# Patient Record
Sex: Female | Born: 1940 | Race: White | Hispanic: No | Marital: Single | Smoking: Never smoker
Health system: Southern US, Community
[De-identification: ages and names within clinical notes are randomized; demographics above are authoritative.]

## PROBLEM LIST (undated history)

## (undated) ENCOUNTER — Emergency Department (HOSPITAL_BASED_OUTPATIENT_CLINIC_OR_DEPARTMENT_OTHER): Admission: EM | Payer: Medicare Other | Source: Home / Self Care

## (undated) DIAGNOSIS — I10 Essential (primary) hypertension: Secondary | ICD-10-CM

## (undated) DIAGNOSIS — T8859XA Other complications of anesthesia, initial encounter: Secondary | ICD-10-CM

## (undated) DIAGNOSIS — Z85828 Personal history of other malignant neoplasm of skin: Secondary | ICD-10-CM

## (undated) DIAGNOSIS — E781 Pure hyperglyceridemia: Secondary | ICD-10-CM

## (undated) DIAGNOSIS — R001 Bradycardia, unspecified: Secondary | ICD-10-CM

## (undated) DIAGNOSIS — M8580 Other specified disorders of bone density and structure, unspecified site: Secondary | ICD-10-CM

## (undated) DIAGNOSIS — T4145XA Adverse effect of unspecified anesthetic, initial encounter: Secondary | ICD-10-CM

## (undated) DIAGNOSIS — E785 Hyperlipidemia, unspecified: Secondary | ICD-10-CM

## (undated) DIAGNOSIS — F419 Anxiety disorder, unspecified: Secondary | ICD-10-CM

## (undated) DIAGNOSIS — I671 Cerebral aneurysm, nonruptured: Secondary | ICD-10-CM

## (undated) DIAGNOSIS — G5600 Carpal tunnel syndrome, unspecified upper limb: Secondary | ICD-10-CM

## (undated) DIAGNOSIS — M1990 Unspecified osteoarthritis, unspecified site: Secondary | ICD-10-CM

## (undated) DIAGNOSIS — R21 Rash and other nonspecific skin eruption: Secondary | ICD-10-CM

## (undated) DIAGNOSIS — E039 Hypothyroidism, unspecified: Secondary | ICD-10-CM

## (undated) DIAGNOSIS — E079 Disorder of thyroid, unspecified: Secondary | ICD-10-CM

## (undated) DIAGNOSIS — M8700 Idiopathic aseptic necrosis of unspecified bone: Secondary | ICD-10-CM

## (undated) DIAGNOSIS — M169 Osteoarthritis of hip, unspecified: Secondary | ICD-10-CM

## (undated) DIAGNOSIS — H269 Unspecified cataract: Secondary | ICD-10-CM

## (undated) DIAGNOSIS — R52 Pain, unspecified: Secondary | ICD-10-CM

## (undated) DIAGNOSIS — B009 Herpesviral infection, unspecified: Secondary | ICD-10-CM

## (undated) DIAGNOSIS — J189 Pneumonia, unspecified organism: Secondary | ICD-10-CM

## (undated) DIAGNOSIS — F32A Depression, unspecified: Secondary | ICD-10-CM

## (undated) DIAGNOSIS — F329 Major depressive disorder, single episode, unspecified: Secondary | ICD-10-CM

## (undated) DIAGNOSIS — C801 Malignant (primary) neoplasm, unspecified: Secondary | ICD-10-CM

## (undated) DIAGNOSIS — M810 Age-related osteoporosis without current pathological fracture: Secondary | ICD-10-CM

## (undated) DIAGNOSIS — J302 Other seasonal allergic rhinitis: Secondary | ICD-10-CM

## (undated) HISTORY — DX: Osteoarthritis of hip, unspecified: M16.9

## (undated) HISTORY — PX: TUBAL LIGATION: SHX77

## (undated) HISTORY — DX: Other specified disorders of bone density and structure, unspecified site: M85.80

## (undated) HISTORY — DX: Other seasonal allergic rhinitis: J30.2

## (undated) HISTORY — DX: Idiopathic aseptic necrosis of unspecified bone: M87.00

## (undated) HISTORY — DX: Cerebral aneurysm, nonruptured: I67.1

## (undated) HISTORY — PX: TOTAL HIP ARTHROPLASTY: SHX124

## (undated) HISTORY — PX: CHOLECYSTECTOMY: SHX55

## (undated) HISTORY — PX: OTHER SURGICAL HISTORY: SHX169

## (undated) HISTORY — DX: Herpesviral infection, unspecified: B00.9

## (undated) HISTORY — PX: TONSILLECTOMY AND ADENOIDECTOMY: SUR1326

## (undated) HISTORY — DX: Disorder of thyroid, unspecified: E07.9

## (undated) HISTORY — DX: Bradycardia, unspecified: R00.1

## (undated) HISTORY — DX: Pure hyperglyceridemia: E78.1

## (undated) SURGERY — Surgical Case
Anesthesia: *Unknown

---

## 2004-12-03 DIAGNOSIS — G7000 Myasthenia gravis without (acute) exacerbation: Secondary | ICD-10-CM | POA: Insufficient documentation

## 2005-10-05 HISTORY — PX: CEREBRAL ANEURYSM REPAIR: SHX164

## 2006-01-02 HISTORY — PX: BELPHAROPTOSIS REPAIR: SHX369

## 2006-11-07 ENCOUNTER — Ambulatory Visit: Payer: Self-pay | Admitting: Internal Medicine

## 2007-04-11 ENCOUNTER — Encounter: Admission: RE | Admit: 2007-04-11 | Discharge: 2007-04-11 | Payer: Self-pay | Admitting: Obstetrics and Gynecology

## 2007-04-27 ENCOUNTER — Emergency Department (HOSPITAL_COMMUNITY): Admission: EM | Admit: 2007-04-27 | Discharge: 2007-04-27 | Payer: Self-pay | Admitting: Emergency Medicine

## 2007-05-22 ENCOUNTER — Ambulatory Visit (HOSPITAL_COMMUNITY): Admission: RE | Admit: 2007-05-22 | Discharge: 2007-05-22 | Payer: Self-pay | Admitting: Internal Medicine

## 2007-05-24 ENCOUNTER — Ambulatory Visit: Payer: Self-pay | Admitting: Critical Care Medicine

## 2007-05-29 ENCOUNTER — Ambulatory Visit: Admission: RE | Admit: 2007-05-29 | Discharge: 2007-05-29 | Payer: Self-pay | Admitting: Critical Care Medicine

## 2007-05-30 ENCOUNTER — Ambulatory Visit: Payer: Self-pay | Admitting: Internal Medicine

## 2007-05-30 ENCOUNTER — Ambulatory Visit (HOSPITAL_COMMUNITY): Admission: RE | Admit: 2007-05-30 | Discharge: 2007-05-30 | Payer: Self-pay | Admitting: Critical Care Medicine

## 2007-07-09 ENCOUNTER — Ambulatory Visit: Payer: Self-pay | Admitting: Critical Care Medicine

## 2008-06-03 ENCOUNTER — Encounter: Admission: RE | Admit: 2008-06-03 | Discharge: 2008-06-03 | Payer: Self-pay | Admitting: Obstetrics and Gynecology

## 2008-07-20 ENCOUNTER — Encounter: Admission: RE | Admit: 2008-07-20 | Discharge: 2008-07-20 | Payer: Self-pay | Admitting: Internal Medicine

## 2009-07-26 ENCOUNTER — Encounter: Admission: RE | Admit: 2009-07-26 | Discharge: 2009-07-26 | Payer: Self-pay | Admitting: Obstetrics and Gynecology

## 2009-10-19 ENCOUNTER — Inpatient Hospital Stay (HOSPITAL_COMMUNITY): Admission: RE | Admit: 2009-10-19 | Discharge: 2009-10-22 | Payer: Self-pay | Admitting: Orthopaedic Surgery

## 2009-10-19 HISTORY — PX: JOINT REPLACEMENT: SHX530

## 2009-12-01 ENCOUNTER — Encounter: Admission: RE | Admit: 2009-12-01 | Discharge: 2009-12-30 | Payer: Self-pay | Admitting: Orthopaedic Surgery

## 2010-08-11 ENCOUNTER — Encounter
Admission: RE | Admit: 2010-08-11 | Discharge: 2010-08-11 | Payer: Self-pay | Source: Home / Self Care | Attending: Internal Medicine | Admitting: Internal Medicine

## 2010-09-14 ENCOUNTER — Encounter
Admission: RE | Admit: 2010-09-14 | Discharge: 2010-09-14 | Payer: Self-pay | Source: Home / Self Care | Attending: Internal Medicine | Admitting: Internal Medicine

## 2010-10-05 ENCOUNTER — Encounter: Payer: Self-pay | Admitting: Rheumatology

## 2010-11-24 LAB — URINALYSIS, ROUTINE W REFLEX MICROSCOPIC
Bilirubin Urine: NEGATIVE
Hgb urine dipstick: NEGATIVE
Nitrite: NEGATIVE
Urobilinogen, UA: 0.2 mg/dL (ref 0.0–1.0)

## 2010-11-24 LAB — BASIC METABOLIC PANEL
BUN: 4 mg/dL — ABNORMAL LOW (ref 6–23)
BUN: 7 mg/dL (ref 6–23)
CO2: 30 mEq/L (ref 19–32)
Calcium: 10.1 mg/dL (ref 8.4–10.5)
Calcium: 8 mg/dL — ABNORMAL LOW (ref 8.4–10.5)
Chloride: 100 mEq/L (ref 96–112)
Chloride: 101 mEq/L (ref 96–112)
Chloride: 104 mEq/L (ref 96–112)
Creatinine, Ser: 0.79 mg/dL (ref 0.4–1.2)
Creatinine, Ser: 0.95 mg/dL (ref 0.4–1.2)
GFR calc Af Amer: >60 mL/min (ref >60)
GFR calc non Af Amer: >60 mL/min (ref >60)
Glucose, Bld: 103 mg/dL — ABNORMAL HIGH (ref 70–99)
Glucose, Bld: 140 mg/dL — ABNORMAL HIGH (ref 70–99)
Potassium: 3.1 mEq/L — ABNORMAL LOW (ref 3.5–5.1)
Potassium: 3.7 mEq/L (ref 3.5–5.1)
Sodium: 134 mEq/L — ABNORMAL LOW (ref 135–145)

## 2010-11-24 LAB — CBC
HCT: 29.3 % — ABNORMAL LOW (ref 36.0–46.0)
HCT: 29.4 % — ABNORMAL LOW (ref 36.0–46.0)
Hemoglobin: 10.2 g/dL — ABNORMAL LOW (ref 12.0–15.0)
Hemoglobin: 10.3 g/dL — ABNORMAL LOW (ref 12.0–15.0)
MCHC: 34.1 g/dL (ref 30.0–36.0)
MCHC: 34.7 g/dL (ref 30.0–36.0)
MCV: 88.5 fL (ref 78.0–100.0)
MCV: 89 fL (ref 78.0–100.0)
MCV: 89 fL (ref 78.0–100.0)
MCV: 89.4 fL (ref 78.0–100.0)
Platelets: 288 10*3/uL (ref 150–400)
Platelets: 311 10*3/uL (ref 150–400)
RBC: 3.3 MIL/uL — ABNORMAL LOW (ref 3.87–5.11)
RBC: 3.33 MIL/uL — ABNORMAL LOW (ref 3.87–5.11)
WBC: 4.5 10*3/uL (ref 4.0–10.5)
WBC: 6.4 10*3/uL (ref 4.0–10.5)

## 2010-11-24 LAB — PROTIME-INR
INR: 1.02 (ref 0.00–1.49)
INR: 1.29 (ref 0.00–1.49)
INR: 1.43 (ref 0.00–1.49)
Prothrombin Time: 13.3 seconds (ref 11.6–15.2)

## 2010-11-24 LAB — TYPE AND SCREEN: Antibody Screen: NEGATIVE

## 2010-11-24 LAB — ABO/RH: ABO/RH(D): A POS

## 2010-11-24 LAB — DIFFERENTIAL
Basophils Absolute: 0 10*3/uL (ref 0.0–0.1)
Basophils Relative: 0 % (ref 0–1)
Lymphocytes Relative: 27 % (ref 12–46)
Monocytes Relative: 8 % (ref 3–12)
Neutrophils Relative %: 63 % (ref 43–77)

## 2010-11-24 LAB — URINE MICROSCOPIC-ADD ON

## 2011-01-17 NOTE — Assessment & Plan Note (Signed)
[facility]                             PULMONARY OFFICE NOTE   Sabrina [name], Sabrina [name]                        MRN:          [id number]  DATE:07/09/2007                            DOB:          February 13, 1941    Ms. Draughon has returns in followup.  This is a 70 year old white female,  history of chronic dyspnea, unclear etiology.  What we discerned with  her was that her cardiopulmonary stress test was normal, with the  exception of mild diastolic dysfunction noted.  She had normal peak O2  consumption.  She had normal ventilation with exercise.  Her O2 pulse  initially increased with exercise but then remained unchanged in the  last 5 minutes of exercise.  This would be consistent with diastolic  dysfunction.  The ventilation, carbon dioxide production slope was  elevated, suggesting dead space, which could be seen in diastolic  dysfunction.  Her pulmonary function tests showed mild restrictive  defect and no obstructive defect.  It is my impression that her  obstructive airways disease is stable.  She is on the Advair 250/51  spray b.i.d. and Singulair 10 mg daily.  This program seems to be  adequate, and her level of dyspnea at this time appears to be largely on  the basis of either diastolic dysfunction or deconditioning and obesity  with restricted lung disease in the spaces.  Her myasthenia gravis does  not appear to be playing a role in her dyspnea.  She does not appear to  have significant ventilatory pump dysfunction, and she does not appear  to have significant interstitial or airway disease at this time.  No  further pulmonary workup or treatment is indicated.  No other change in  the patient's medication profile is recommended.  Will see the patient  back as needed.     Charlcie Cradle Delford Field, MD, [facility]  Electronically Signed    PEW/MedQ  DD: 07/09/2007  DT: 07/10/2007  Job #: [id number]   cc:   Kari Baars, M.D.

## 2011-01-17 NOTE — Assessment & Plan Note (Signed)
[facility]                             PULMONARY OFFICE NOTE   Sabrina [name], Sabrina [name]                        MRN:          [id number]  DATE:05/24/2007                            DOB:          02-05-1941    CHIEF COMPLAINT:  Evaluate dyspnea.   HISTORY OF PRESENT ILLNESS:  This is a 70 year old white female who has  had myasthenic gravis diagnosed since early 2007.  She initially had  ocular involvement, then had progressive bulbar and motor weakness  involvement.  She has noticed increased shortness of breath for the last  6 weeks.  She initially thought this was a myasthenia flare.  She had  some pulse prednisone given without much improvement, then she underwent  plasmapheresis x3.  Again, no real improvement in the breathlessness.  Prednisone is now down to 20 mg a day.  She has been on prednisone for a  year prior to this, and was stable until relapse 6 weeks ago.  She did  have more ocular and leg weakness, but this was improved with the pulse  prednisone and plasmapheresis, however, the dyspnea has not been  improved.  She is short of breath at rest and with exertion.  She is  worse going up a flight of stairs and walking greater than 1 block on  level ground.  She has no cough.  She was diagnosed with asthma 6 years  ago.  Denies any wheezing.  Is not using a rescue inhaler.  Has has had  no recent PFTs.  Recently had a ventilation perfusion lung scan, which  showed no pulmonary emboli.  Chest x-ray has shown no active disease.  She denies any nocturnal symptoms.  She does note a rapid pulse.  She  has no hemoptysis.  There is no loss of appetite, dysphagia, sore  throat, tooth or dental problems.  There are no headaches, sneezing,  itching, earache.  She has had some anxiety, but the Xanax does not help  the dyspnea.  She is a lifelong never smoker.  She is referred for  further evaluation.   PAST MEDICAL HISTORY:  Medical history of:  1.  Hypertension.  2. History of asthma.  3. Myasthenic gravis.  4. 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  5. Cholecystectomy in 1985.  6. Tubal ligation in 1975.   MEDICATIONS:  1. SODIUM PENTHOTHAL CAUSES SHOCK.  2. IMURAN CAUSES A SKIN RASH.   CURRENT MEDICATIONS:  1. Advair 250/50 one spray b.i.d.  2. Ambien 5 mg daily.  3. Fluticasone 2 sprays each nostril daily.  4. Lisinopril/hydrochlorothiazide 20/12.5 daily.  5. Prednisone 20 mg daily.  6. Singulair 10 mg daily.  7. Synthroid 75 mcg daily.  8. Trazodone 75 mg nightly.  9. Caltrate D 1200 mg daily.  10.Centrum daily.  11.Vitamin C daily.  12.Fish oil daily.  13.Zinc daily.  14.Albuterol p.r.n.   SOCIAL HISTORY:  Nonsmoker as noted.  Retired Film/video editor.  Lives at  home with herself.  She has children.  She is divorced.   FAMILY HISTORY:  Heart disease in mother and father.   PHYSICAL EXAMINATION:  This is an obese white female in no acute  distress.  Temperature 97.9.  Blood pressure 120/82.  Pulse 87.  Saturation 97%  room air.  CHEST:  Showed to be completely clear to auscultation and percussion.  There is no evidence of wheeze, rale, or rhonchi.  CARDIAC EXAM:  Showed a regular rate and rhythm without S3.  Normal S1  and S2.  ABDOMEN:  Soft and nontender.  EXTREMITIES:  Showed no edema, clubbing, or venous disease.  SKIN:  Was clear.  NEUROLOGIC EXAM:  Intact.  HEENT EXAM:  Showed no jugular venous distention.  No lymphadenopathy.  Oropharynx clear.  Neck supple.   IMPRESSION:  Is that of chronic dyspnea on the basis of myasthenia  gravis.  Doubt there is primary lung disease here.   PLAN:  Will be to obtain a full set of pulmonary function studies to  include diffusion capacity and lung volumes.  We will also obtain a  cardiopulmonary stress test.  Once the results of this are available,  further recommendations would follow.     Charlcie Cradle Delford Field, MD, [facility]  Electronically Signed     PEW/MedQ  DD: 05/24/2007  DT: 05/25/2007  Job #: [id number]   cc:   Kari Baars, M.D.  Orlene Plum, M.D.

## 2011-01-20 NOTE — Assessment & Plan Note (Signed)
[facility]                         GASTROENTEROLOGY OFFICE NOTE   JAN, WALTERS                        MRN:          [id number]  DATE:11/07/2006                            DOB:          Feb 01, 1941    REASON FOR CONSULTATION:  Screening colonoscopy.   HISTORY:  This is a 70 year old white female with a history of  hypertension, asthma, hypothyroidism and myasthenia gravis. She is  referred through the courtesy of Dr. Clelia Croft regarding screening  colonoscopy. The patient requested an office appointment to discuss this  in detail as well as address some of her concerns regarding anesthesia.  First, the patient denies having had prior screening colonoscopy.   REVIEW OF SYSTEMS:  Is currently unremarkable. No nausea, vomiting,  abdominal pain, diarrhea, constipation, melena, or hematochezia. Her  weight has been stable. She reports to me that she has had some problems  when she has had anesthesia in the past with difficulty with her blood  pressure. There have been other times when she has received sedatives or  anesthesia when she has had no difficulties. However, it is not clear  that she has had any problems related to conscious sedation. Her  myasthenia gravis has been stable.   PAST MEDICAL HISTORY:  1. Hypertension.  2. Asthma.  3. Hypothyroidism.  4. Myasthenia gravis.  5. History of cerebral aneurysm, status post coiling procedure      February 2007.   PAST SURGICAL HISTORY:  1. Cholecystectomy.  2. Tubal ligation.   ALLERGIES:  1. IODINE CONTRAST.  2. IMURAN.  3. SODIUM PANTHENOL.   CURRENT MEDICATIONS:  1. Advair 250/50 one puff b.i.d.  2. Ambien 5 mg at night.  3. Fluticasone 50 mcg two sprays each nostril daily.  4. Lisinopril/hydrochlorothiazide 20/12.5 mg daily.  5. Prednisone 10 mg daily.  6. Singulair 10 mg daily.  7. Synthroid 75 mcg daily.  8. Trazodone 75 mg daily.  9. Caltrate with vitamin D 1200 mg daily.  10.Multivitamin daily.  11.Vitamin C.  12.Fish oil 1200 mg daily.  13.Osteo-BiFlex.  14.Zinc 50 mg daily.   FAMILY HISTORY:  No family history of gastrointestinal malignancy. There  is a family history of diabetes and heart disease.   SOCIAL HISTORY:  The patient is divorced with two children and she lives  alone. She is retired from Audiological scientist. She does not smoke. She has one  or two alcoholic beverages per month.   REVIEW OF SYSTEMS:  Per Diagnostic Evaluation Form.   PHYSICAL EXAMINATION:  Well-appearing female in no acute distress. Blood  pressure is 136/84, heart rate is 80, weight is 220 pounds. She is 5  feet, 6 inches in height.  HEENT: Sclerae anicteric. Conjunctivae are pink. Oral mucosa intact. No  adenopathy.  LUNGS:  Are clear.  HEART: Is regular.  ABDOMEN: Soft without tenderness, mass or hernia. Good bowel sounds  heard.  EXTREMITIES: Without edema.   IMPRESSION:  This is a 70 year old female with multiple general medical  problems which are currently well-compensated. She presents regarding  screening colonoscopy. She is an appropriate candidate without  contraindication. The nature  of the procedure as well as risks, benefits  and alternatives were discussed in great detail. In addition, literature  provided as well as she was offered the opportunity to watch a movie on  the examination, but declined. She did however understand the issues at  hand and was interested in proceeding.     Wilhemina Bonito. Marina Goodell, MD  Electronically Signed    JNP/MedQ  DD: 11/14/2006  DT: 11/15/2006  Job #: [id number]   cc:   Kari Baars, M.D.

## 2011-03-14 ENCOUNTER — Encounter (INDEPENDENT_AMBULATORY_CARE_PROVIDER_SITE_OTHER): Payer: Self-pay | Admitting: Surgery

## 2011-03-15 ENCOUNTER — Ambulatory Visit (INDEPENDENT_AMBULATORY_CARE_PROVIDER_SITE_OTHER): Payer: Medicare Other | Admitting: Surgery

## 2011-03-15 ENCOUNTER — Encounter (INDEPENDENT_AMBULATORY_CARE_PROVIDER_SITE_OTHER): Payer: Self-pay | Admitting: Surgery

## 2011-03-15 VITALS — BP 116/78 | HR 68 | Temp 96.2°F | Ht 66.0 in | Wt 204.4 lb

## 2011-03-15 DIAGNOSIS — K430 Incisional hernia with obstruction, without gangrene: Secondary | ICD-10-CM

## 2011-03-15 NOTE — Progress Notes (Signed)
Subjective:     Patient ID: Sabrina [name], female   DOB: 03/24/1941, 70 y.o.   MRN: [id number]    BP 116/78  Pulse 68  Temp 96.2 F (35.7 C)  Ht 5\' 6"  (1.676 m)  Wt 204 lb 6.4 oz (92.715 kg)  BMI 32.99 kg/m2    HPI This is a very pleasant 70 year old female referred by Dr. Buren Kos for evaluation of an incisional hernia. She reports she has had an incisional hernia since her gallbladder removal in the late 1980s. She has it in 2 separate areas. It causes her almost no discomfort. She has never had obstructive symptoms or nausea or vomiting. She is active but does no heavy lifting.  Review of Systems  HENT: Negative.   Eyes: Negative.   Respiratory: Negative.   Cardiovascular: Negative.   Gastrointestinal: Negative.   Genitourinary: Negative.   Musculoskeletal: Negative.   Hematological: Negative.   Psychiatric/Behavioral: Negative.        Objective:   Physical Exam  Constitutional: She is oriented to person, place, and time. She appears well-developed and well-nourished.  HENT:  Head: Normocephalic and atraumatic.  Right Ear: External ear normal.  Left Ear: External ear normal.  Mouth/Throat: Oropharynx is clear and moist.  Eyes: Pupils are equal, round, and reactive to light. No scleral icterus.  Neck: Neck supple. No tracheal deviation present. No thyromegaly present.  Cardiovascular: Normal rate, regular rhythm and normal heart sounds.   No murmur heard. Pulmonary/Chest: Effort normal and breath sounds normal. No respiratory distress.  Abdominal: Soft. Normal appearance. There is no tenderness. A hernia is present. Hernia confirmed positive in the ventral area.  Musculoskeletal: Normal range of motion.  Neurological: She is alert and oriented to person, place, and time.  Skin: Skin is warm and dry. No rash noted.       Assessment:     Ventral incisional hernia   Plan:     I had a long discussion with the patient regarding the diagnosis of hernia. At this  point it is not incarcerated. I do suspect that is has only omentum going into the hernia. She currently is not interested in having this repair which I feel is very reasonable. I discussed the signs of incarceration with her including abdominal pain nausea and vomiting. She she develop any of the symptoms or she feels like hernia skin larger or she changes her mind we will proceed with laparoscopic repair with mesh. Currently I will see her back as needed.

## 2011-06-16 LAB — POCT CARDIAC MARKERS
CKMB, poc: <1 — ABNORMAL LOW
Operator id: 294511
Troponin i, poc: <0.05
Troponin i, poc: <0.05

## 2011-06-16 LAB — URINALYSIS, ROUTINE W REFLEX MICROSCOPIC
Bilirubin Urine: NEGATIVE
Hgb urine dipstick: NEGATIVE
Ketones, ur: NEGATIVE
Protein, ur: NEGATIVE
Urobilinogen, UA: 0.2

## 2011-06-16 LAB — DIFFERENTIAL
Basophils Relative: 0
Eosinophils Absolute: 0
Monocytes Absolute: 0.2
Monocytes Relative: 2 — ABNORMAL LOW
Neutrophils Relative %: 93 — ABNORMAL HIGH

## 2011-06-16 LAB — CBC
Hemoglobin: 14.8
MCHC: 34.5
MCV: 90.4
RBC: 4.74

## 2011-06-16 LAB — I-STAT 8, (EC8 V) (CONVERTED LAB)
Acid-base deficit: 1
Chloride: 108
Glucose, Bld: 144 — ABNORMAL HIGH
Potassium: 3.6
pH, Ven: 7.453 — ABNORMAL HIGH

## 2011-09-22 ENCOUNTER — Other Ambulatory Visit: Payer: Self-pay | Admitting: Internal Medicine

## 2011-09-22 DIAGNOSIS — Z1231 Encounter for screening mammogram for malignant neoplasm of breast: Secondary | ICD-10-CM

## 2011-10-06 ENCOUNTER — Ambulatory Visit
Admission: RE | Admit: 2011-10-06 | Discharge: 2011-10-06 | Disposition: A | Discharge status: Home / Self Care | Payer: Self-pay | Source: Ambulatory Visit | Attending: Internal Medicine | Admitting: Internal Medicine

## 2011-10-06 DIAGNOSIS — Z1231 Encounter for screening mammogram for malignant neoplasm of breast: Secondary | ICD-10-CM | POA: Diagnosis not present

## 2011-10-10 DIAGNOSIS — F339 Major depressive disorder, recurrent, unspecified: Secondary | ICD-10-CM | POA: Diagnosis not present

## 2011-10-16 DIAGNOSIS — G5600 Carpal tunnel syndrome, unspecified upper limb: Secondary | ICD-10-CM | POA: Diagnosis not present

## 2011-10-16 DIAGNOSIS — M67919 Unspecified disorder of synovium and tendon, unspecified shoulder: Secondary | ICD-10-CM | POA: Diagnosis not present

## 2011-10-16 DIAGNOSIS — M719 Bursopathy, unspecified: Secondary | ICD-10-CM | POA: Diagnosis not present

## 2011-10-20 DIAGNOSIS — R209 Unspecified disturbances of skin sensation: Secondary | ICD-10-CM | POA: Diagnosis not present

## 2011-11-06 DIAGNOSIS — M25519 Pain in unspecified shoulder: Secondary | ICD-10-CM | POA: Diagnosis not present

## 2011-11-22 DIAGNOSIS — M25519 Pain in unspecified shoulder: Secondary | ICD-10-CM | POA: Diagnosis not present

## 2011-11-23 ENCOUNTER — Other Ambulatory Visit: Payer: Self-pay | Admitting: Orthopaedic Surgery

## 2011-11-23 DIAGNOSIS — M25512 Pain in left shoulder: Secondary | ICD-10-CM

## 2011-11-24 ENCOUNTER — Other Ambulatory Visit: Payer: Self-pay | Admitting: Orthopaedic Surgery

## 2011-11-24 DIAGNOSIS — M25511 Pain in right shoulder: Secondary | ICD-10-CM

## 2011-11-27 ENCOUNTER — Ambulatory Visit
Admission: RE | Admit: 2011-11-27 | Discharge: 2011-11-27 | Disposition: A | Discharge status: Home / Self Care | Payer: Medicare Other | Source: Ambulatory Visit | Attending: Orthopaedic Surgery | Admitting: Orthopaedic Surgery

## 2011-11-27 DIAGNOSIS — S46819A Strain of other muscles, fascia and tendons at shoulder and upper arm level, unspecified arm, initial encounter: Secondary | ICD-10-CM | POA: Diagnosis not present

## 2011-11-27 DIAGNOSIS — M25512 Pain in left shoulder: Secondary | ICD-10-CM

## 2011-11-27 DIAGNOSIS — M19019 Primary osteoarthritis, unspecified shoulder: Secondary | ICD-10-CM | POA: Diagnosis not present

## 2011-11-27 DIAGNOSIS — M25519 Pain in unspecified shoulder: Secondary | ICD-10-CM | POA: Diagnosis not present

## 2011-11-27 DIAGNOSIS — M25511 Pain in right shoulder: Secondary | ICD-10-CM

## 2011-12-06 ENCOUNTER — Other Ambulatory Visit: Payer: Self-pay | Admitting: Orthopaedic Surgery

## 2011-12-06 DIAGNOSIS — M898X1 Other specified disorders of bone, shoulder: Secondary | ICD-10-CM

## 2011-12-18 ENCOUNTER — Other Ambulatory Visit (HOSPITAL_COMMUNITY): Payer: Self-pay | Admitting: Orthopaedic Surgery

## 2011-12-18 DIAGNOSIS — M25519 Pain in unspecified shoulder: Secondary | ICD-10-CM | POA: Diagnosis not present

## 2011-12-21 ENCOUNTER — Ambulatory Visit (HOSPITAL_COMMUNITY)
Admission: RE | Admit: 2011-12-21 | Discharge: 2011-12-21 | Disposition: A | Discharge status: Home / Self Care | Payer: Medicare Other | Source: Ambulatory Visit | Attending: Orthopaedic Surgery | Admitting: Orthopaedic Surgery

## 2011-12-21 ENCOUNTER — Encounter (HOSPITAL_COMMUNITY)
Admission: RE | Admit: 2011-12-21 | Discharge: 2011-12-21 | Disposition: A | Discharge status: Home / Self Care | Payer: Medicare Other | Source: Ambulatory Visit | Attending: Orthopaedic Surgery | Admitting: Orthopaedic Surgery

## 2011-12-21 ENCOUNTER — Encounter (HOSPITAL_COMMUNITY): Payer: Self-pay | Admitting: Pharmacy Technician

## 2011-12-21 ENCOUNTER — Encounter (HOSPITAL_COMMUNITY): Payer: Self-pay

## 2011-12-21 DIAGNOSIS — G7000 Myasthenia gravis without (acute) exacerbation: Secondary | ICD-10-CM | POA: Diagnosis not present

## 2011-12-21 DIAGNOSIS — J45909 Unspecified asthma, uncomplicated: Secondary | ICD-10-CM | POA: Diagnosis not present

## 2011-12-21 DIAGNOSIS — Z01811 Encounter for preprocedural respiratory examination: Secondary | ICD-10-CM | POA: Diagnosis not present

## 2011-12-21 DIAGNOSIS — Z01812 Encounter for preprocedural laboratory examination: Secondary | ICD-10-CM | POA: Insufficient documentation

## 2011-12-21 DIAGNOSIS — E039 Hypothyroidism, unspecified: Secondary | ICD-10-CM | POA: Diagnosis not present

## 2011-12-21 DIAGNOSIS — Z79899 Other long term (current) drug therapy: Secondary | ICD-10-CM | POA: Diagnosis not present

## 2011-12-21 DIAGNOSIS — M25419 Effusion, unspecified shoulder: Secondary | ICD-10-CM | POA: Diagnosis not present

## 2011-12-21 DIAGNOSIS — S43429A Sprain of unspecified rotator cuff capsule, initial encounter: Secondary | ICD-10-CM | POA: Diagnosis not present

## 2011-12-21 DIAGNOSIS — Z01810 Encounter for preprocedural cardiovascular examination: Secondary | ICD-10-CM | POA: Insufficient documentation

## 2011-12-21 DIAGNOSIS — I10 Essential (primary) hypertension: Secondary | ICD-10-CM | POA: Insufficient documentation

## 2011-12-21 DIAGNOSIS — M6289 Other specified disorders of muscle: Secondary | ICD-10-CM | POA: Insufficient documentation

## 2011-12-21 DIAGNOSIS — M659 Synovitis and tenosynovitis, unspecified: Secondary | ICD-10-CM | POA: Diagnosis not present

## 2011-12-21 DIAGNOSIS — I440 Atrioventricular block, first degree: Secondary | ICD-10-CM | POA: Insufficient documentation

## 2011-12-21 DIAGNOSIS — M19019 Primary osteoarthritis, unspecified shoulder: Secondary | ICD-10-CM | POA: Diagnosis not present

## 2011-12-21 DIAGNOSIS — M25519 Pain in unspecified shoulder: Secondary | ICD-10-CM | POA: Diagnosis not present

## 2011-12-21 DIAGNOSIS — M25819 Other specified joint disorders, unspecified shoulder: Secondary | ICD-10-CM | POA: Diagnosis not present

## 2011-12-21 HISTORY — DX: Major depressive disorder, single episode, unspecified: F32.9

## 2011-12-21 HISTORY — DX: Personal history of other malignant neoplasm of skin: Z85.828

## 2011-12-21 HISTORY — DX: Depression, unspecified: F32.A

## 2011-12-21 HISTORY — DX: Other complications of anesthesia, initial encounter: T88.59XA

## 2011-12-21 HISTORY — DX: Hypothyroidism, unspecified: E03.9

## 2011-12-21 HISTORY — DX: Essential (primary) hypertension: I10

## 2011-12-21 HISTORY — DX: Unspecified osteoarthritis, unspecified site: M19.90

## 2011-12-21 HISTORY — DX: Adverse effect of unspecified anesthetic, initial encounter: T41.45XA

## 2011-12-21 HISTORY — DX: Anxiety disorder, unspecified: F41.9

## 2011-12-21 LAB — CBC
HCT: 41.8 % (ref 36.0–46.0)
Hemoglobin: 14.2 g/dL (ref 12.0–15.0)
MCV: 90.5 fL (ref 78.0–100.0)
Platelets: 381 10*3/uL (ref 150–400)
RBC: 4.62 MIL/uL (ref 3.87–5.11)
WBC: 8.8 10*3/uL (ref 4.0–10.5)

## 2011-12-21 LAB — BASIC METABOLIC PANEL
BUN: 15 mg/dL (ref 6–23)
CO2: 26 mEq/L (ref 19–32)
Chloride: 101 mEq/L (ref 96–112)
Creatinine, Ser: 0.83 mg/dL (ref 0.50–1.10)
Glucose, Bld: 86 mg/dL (ref 70–99)

## 2011-12-21 NOTE — Anesthesia Preprocedure Evaluation (Addendum)
Anesthesia Evaluation  Patient identified by MRN, date of birth, ID band Patient awake  General Assessment Comment:Note antibiotic contraindications w/ MG Hx of sensitivity to anesthetics in the past  Reviewed: Allergy & Precautions, H&P , NPO status , Patient's Chart, lab work & pertinent test results, reviewed documented beta blocker date and time   Airway Mallampati: II TM Distance: >3 FB Neck ROM: Full    Dental  (+) Teeth Intact and Dental Advisory Given   Pulmonary asthma ,  Well controlled breath sounds clear to auscultation        Cardiovascular hypertension, Pt. on medications Rhythm:Regular Rate:Normal  Denies cardiac symptoms   Neuro/Psych Myasthenia Gravis, not symptomatic Denies dysphagia, motor weakness, does tire after extended walking. Neurology pre-op note recommends no intra-op paralytics Doesn't take anti-cholinesterase S/p coil oclusion of cerebral aneurysm 2007 Denies neurologic deficits from procedure  Neuromuscular disease negative psych ROS   GI/Hepatic negative GI ROS, Neg liver ROS,   Endo/Other  Hypothyroidism Thyroid replacement  Renal/GU negative Renal ROS  negative genitourinary   Musculoskeletal negative musculoskeletal ROS (+)   Abdominal   Peds negative pediatric ROS (+)  Hematology negative hematology ROS (+)   Anesthesia Other Findings   Reproductive/Obstetrics negative OB ROS                          Anesthesia Physical Anesthesia Plan  ASA: III  Anesthesia Plan: General   Post-op Pain Management:    Induction: Inhalational  Airway Management Planned: Oral ETT  Additional Equipment:   Intra-op Plan:   Post-operative Plan: Extubation in OR and Possible Post-op intubation/ventilation  Informed Consent: I have reviewed the patients History and Physical, chart, labs and discussed the procedure including the risks, benefits and alternatives for  the proposed anesthesia with the patient or authorized representative who has indicated his/her understanding and acceptance.   Dental advisory given  Plan Discussed with: CRNA and Surgeon  Anesthesia Plan Comments: (Discussed IV induction then deepening w/ inhalational agent prior to intubation. Discussed possible post-op ventilation. Note medication contra-indications on neurology pre-op note Discussed interscalene block, risk of unilateral phrenic nerve block. Potential for respiratory compromise post-op. Post-op step-down unit)       Anesthesia Quick Evaluation

## 2011-12-21 NOTE — Patient Instructions (Addendum)
20 Sabrina [name]  12/21/2011   Your procedure is scheduled on:  12/22/11  Report to SHORT STAY DEPT  at 9:00 AM.  Call this number if you have problems the morning of surgery: [phone]   Remember:   Do not eat food or drink liquids AFTER MIDNIGHT    Take these medicines the morning of surgery with A SIP OF WATER: USE ADVAIR    Do not wear jewelry, make-up or nail polish.  Do not wear lotions, powders, or perfumes.   Do not shave legs or underarms 48 hrs. before surgery (men may shave face)  Do not bring valuables to the hospital.  Contacts, dentures or bridgework may not be worn into surgery.  Leave suitcase in the car. After surgery it may be brought to your room.  For patients admitted to the hospital, checkout time is 11:00 AM the day of discharge.   Patients discharged the day of surgery will not be allowed to drive home.    Special Instructions:   Please read over the following fact sheets that you were given: MRSA  Information               SHOWER WITH BETASEPT THE NIGHT BEFORE SURGERY AND THE MORNING OF SURGERY

## 2011-12-22 ENCOUNTER — Ambulatory Visit (HOSPITAL_COMMUNITY)
Admission: RE | Admit: 2011-12-22 | Discharge: 2011-12-23 | Disposition: A | Discharge status: Home / Self Care | Payer: Medicare Other | Source: Ambulatory Visit | Attending: Orthopaedic Surgery | Admitting: Orthopaedic Surgery

## 2011-12-22 ENCOUNTER — Encounter (HOSPITAL_COMMUNITY): Payer: Self-pay | Admitting: Anesthesiology

## 2011-12-22 ENCOUNTER — Encounter (HOSPITAL_COMMUNITY)
Admission: RE | Disposition: A | Discharge status: Home / Self Care | Payer: Self-pay | Source: Ambulatory Visit | Attending: Orthopaedic Surgery

## 2011-12-22 ENCOUNTER — Encounter (HOSPITAL_COMMUNITY): Payer: Self-pay | Admitting: *Deleted

## 2011-12-22 ENCOUNTER — Ambulatory Visit (HOSPITAL_COMMUNITY): Payer: Medicare Other | Admitting: Anesthesiology

## 2011-12-22 DIAGNOSIS — J45909 Unspecified asthma, uncomplicated: Secondary | ICD-10-CM | POA: Insufficient documentation

## 2011-12-22 DIAGNOSIS — M659 Unspecified synovitis and tenosynovitis, unspecified site: Secondary | ICD-10-CM | POA: Insufficient documentation

## 2011-12-22 DIAGNOSIS — M75120 Complete rotator cuff tear or rupture of unspecified shoulder, not specified as traumatic: Secondary | ICD-10-CM | POA: Diagnosis not present

## 2011-12-22 DIAGNOSIS — M19019 Primary osteoarthritis, unspecified shoulder: Secondary | ICD-10-CM | POA: Diagnosis not present

## 2011-12-22 DIAGNOSIS — M25819 Other specified joint disorders, unspecified shoulder: Secondary | ICD-10-CM | POA: Diagnosis not present

## 2011-12-22 DIAGNOSIS — M7540 Impingement syndrome of unspecified shoulder: Secondary | ICD-10-CM

## 2011-12-22 DIAGNOSIS — E039 Hypothyroidism, unspecified: Secondary | ICD-10-CM | POA: Insufficient documentation

## 2011-12-22 DIAGNOSIS — Z01812 Encounter for preprocedural laboratory examination: Secondary | ICD-10-CM | POA: Insufficient documentation

## 2011-12-22 DIAGNOSIS — I10 Essential (primary) hypertension: Secondary | ICD-10-CM | POA: Insufficient documentation

## 2011-12-22 DIAGNOSIS — M25419 Effusion, unspecified shoulder: Secondary | ICD-10-CM | POA: Insufficient documentation

## 2011-12-22 DIAGNOSIS — M25519 Pain in unspecified shoulder: Secondary | ICD-10-CM | POA: Insufficient documentation

## 2011-12-22 DIAGNOSIS — Z79899 Other long term (current) drug therapy: Secondary | ICD-10-CM | POA: Insufficient documentation

## 2011-12-22 DIAGNOSIS — S43429A Sprain of unspecified rotator cuff capsule, initial encounter: Secondary | ICD-10-CM | POA: Insufficient documentation

## 2011-12-22 DIAGNOSIS — M67919 Unspecified disorder of synovium and tendon, unspecified shoulder: Secondary | ICD-10-CM | POA: Diagnosis not present

## 2011-12-22 DIAGNOSIS — G7000 Myasthenia gravis without (acute) exacerbation: Secondary | ICD-10-CM | POA: Insufficient documentation

## 2011-12-22 DIAGNOSIS — M719 Bursopathy, unspecified: Secondary | ICD-10-CM | POA: Diagnosis not present

## 2011-12-22 DIAGNOSIS — X58XXXA Exposure to other specified factors, initial encounter: Secondary | ICD-10-CM | POA: Insufficient documentation

## 2011-12-22 SURGERY — SHOULDER ARTHROSCOPY WITH SUBACROMIAL DECOMPRESSION
Anesthesia: General | Site: Shoulder | Laterality: Left | Wound class: Clean

## 2011-12-22 MED ORDER — VANCOMYCIN HCL IN DEXTROSE 1-5 GM/200ML-% IV SOLN
INTRAVENOUS | Status: AC
  Filled 2011-12-22: qty 200

## 2011-12-22 MED ORDER — ONDANSETRON HCL 4 MG PO TABS: 4.0000 mg | ORAL_TABLET | Freq: Four times a day (QID) | ORAL | Status: DC | PRN

## 2011-12-22 MED ORDER — DIPHENHYDRAMINE HCL 12.5 MG/5ML PO ELIX: 12.5000 mg | ORAL_SOLUTION | ORAL | Status: DC | PRN

## 2011-12-22 MED ORDER — TRAMADOL HCL 50 MG PO TABS
50.0000 mg | ORAL_TABLET | Freq: Four times a day (QID) | ORAL | Status: DC | PRN
  Administered 2011-12-23: 50 mg via ORAL
  Filled 2011-12-22: qty 1

## 2011-12-22 MED ORDER — MIDAZOLAM HCL 5 MG/5ML IJ SOLN
INTRAMUSCULAR | Status: DC | PRN
  Administered 2011-12-22: 2 mg via INTRAVENOUS

## 2011-12-22 MED ORDER — PHENYLEPHRINE HCL 10 MG/ML IJ SOLN
INTRAMUSCULAR | Status: DC | PRN
  Administered 2011-12-22: 80 ug via INTRAVENOUS

## 2011-12-22 MED ORDER — METHOCARBAMOL 500 MG PO TABS: 500.0000 mg | ORAL_TABLET | Freq: Four times a day (QID) | ORAL | Status: DC | PRN

## 2011-12-22 MED ORDER — METOCLOPRAMIDE HCL 5 MG/ML IJ SOLN
5.0000 mg | Freq: Three times a day (TID) | INTRAMUSCULAR | Status: DC | PRN
  Administered 2011-12-23: 5 mg via INTRAVENOUS
  Filled 2011-12-22: qty 2

## 2011-12-22 MED ORDER — HYDROMORPHONE HCL PF 1 MG/ML IJ SOLN
0.2500 mg | INTRAMUSCULAR | Status: DC | PRN
  Administered 2011-12-22 (×4): 0.25 mg via INTRAVENOUS

## 2011-12-22 MED ORDER — MEPERIDINE HCL 50 MG/ML IJ SOLN
6.2500 mg | INTRAMUSCULAR | Status: DC | PRN
  Administered 2011-12-22: 12.5 mg via INTRAVENOUS

## 2011-12-22 MED ORDER — BUPIVACAINE HCL 0.25 % IJ SOLN
INTRAMUSCULAR | Status: DC | PRN
  Administered 2011-12-22: 20 mL

## 2011-12-22 MED ORDER — PROMETHAZINE HCL 25 MG/ML IJ SOLN: 6.2500 mg | INTRAMUSCULAR | Status: DC | PRN

## 2011-12-22 MED ORDER — FLUTICASONE-SALMETEROL 250-50 MCG/DOSE IN AEPB
1.0000 | INHALATION_SPRAY | Freq: Two times a day (BID) | RESPIRATORY_TRACT | Status: DC
  Administered 2011-12-22 – 2011-12-23 (×2): 1 via RESPIRATORY_TRACT
  Filled 2011-12-22: qty 14

## 2011-12-22 MED ORDER — MORPHINE SULFATE 2 MG/ML IJ SOLN
1.0000 mg | INTRAMUSCULAR | Status: DC | PRN
  Administered 2011-12-22 (×2): 1 mg via INTRAVENOUS
  Filled 2011-12-22 (×2): qty 1

## 2011-12-22 MED ORDER — MYCOPHENOLATE MOFETIL 500 MG PO TABS
1000.0000 mg | ORAL_TABLET | Freq: Two times a day (BID) | ORAL | Status: DC
  Administered 2011-12-22 – 2011-12-23 (×2): 1000 mg via ORAL

## 2011-12-22 MED ORDER — ALPRAZOLAM 0.5 MG PO TABS
0.5000 mg | ORAL_TABLET | Freq: Four times a day (QID) | ORAL | Status: DC
  Administered 2011-12-22 – 2011-12-23 (×4): 0.5 mg via ORAL
  Filled 2011-12-22 (×4): qty 1

## 2011-12-22 MED ORDER — LIDOCAINE HCL (CARDIAC) 20 MG/ML IV SOLN
INTRAVENOUS | Status: DC | PRN
  Administered 2011-12-22: 50 mg via INTRAVENOUS

## 2011-12-22 MED ORDER — ONDANSETRON HCL 4 MG/2ML IJ SOLN
INTRAMUSCULAR | Status: DC | PRN
  Administered 2011-12-22: 4 mg via INTRAVENOUS

## 2011-12-22 MED ORDER — SODIUM CHLORIDE 0.9 % IV SOLN
INTRAVENOUS | Status: DC
  Administered 2011-12-22: 10 mL/h via INTRAVENOUS

## 2011-12-22 MED ORDER — EPINEPHRINE HCL 1 MG/ML IJ SOLN
INTRAMUSCULAR | Status: AC
  Filled 2011-12-22: qty 2

## 2011-12-22 MED ORDER — ADULT MULTIVITAMIN W/MINERALS CH
1.0000 | ORAL_TABLET | Freq: Every day | ORAL | Status: DC
  Administered 2011-12-23: 1 via ORAL
  Filled 2011-12-22 (×2): qty 1

## 2011-12-22 MED ORDER — FLUOXETINE HCL 20 MG PO TABS
20.0000 mg | ORAL_TABLET | Freq: Every morning | ORAL | Status: DC
  Administered 2011-12-22 – 2011-12-23 (×2): 20 mg via ORAL
  Filled 2011-12-22 (×2): qty 1

## 2011-12-22 MED ORDER — MEPERIDINE HCL 50 MG/ML IJ SOLN
INTRAMUSCULAR | Status: AC
  Filled 2011-12-22: qty 1

## 2011-12-22 MED ORDER — PROPOFOL 10 MG/ML IV BOLUS
INTRAVENOUS | Status: DC | PRN
  Administered 2011-12-22: 100 mg via INTRAVENOUS
  Administered 2011-12-22: 50 mg via INTRAVENOUS

## 2011-12-22 MED ORDER — ONDANSETRON HCL 4 MG/2ML IJ SOLN
4.0000 mg | Freq: Four times a day (QID) | INTRAMUSCULAR | Status: DC | PRN
  Administered 2011-12-23: 4 mg via INTRAVENOUS
  Filled 2011-12-22: qty 2

## 2011-12-22 MED ORDER — FENTANYL CITRATE 0.05 MG/ML IJ SOLN
INTRAMUSCULAR | Status: DC | PRN
  Administered 2011-12-22 (×2): 25 ug via INTRAVENOUS
  Administered 2011-12-22 (×2): 50 ug via INTRAVENOUS
  Administered 2011-12-22 (×4): 25 ug via INTRAVENOUS

## 2011-12-22 MED ORDER — ETOMIDATE 2 MG/ML IV SOLN
INTRAVENOUS | Status: DC | PRN
  Administered 2011-12-22: 6 mg via INTRAVENOUS
  Administered 2011-12-22: 8 mg via INTRAVENOUS

## 2011-12-22 MED ORDER — CLINDAMYCIN PHOSPHATE 600 MG/50ML IV SOLN: 600.0000 mg | INTRAVENOUS | Status: DC

## 2011-12-22 MED ORDER — BUPIVACAINE-EPINEPHRINE PF 0.25-1:200000 % IJ SOLN
INTRAMUSCULAR | Status: AC
  Filled 2011-12-22: qty 30

## 2011-12-22 MED ORDER — METOCLOPRAMIDE HCL 10 MG PO TABS: 5.0000 mg | ORAL_TABLET | Freq: Three times a day (TID) | ORAL | Status: DC | PRN

## 2011-12-22 MED ORDER — HYDROMORPHONE HCL PF 1 MG/ML IJ SOLN
INTRAMUSCULAR | Status: AC
  Filled 2011-12-22: qty 1

## 2011-12-22 MED ORDER — LEVOTHYROXINE SODIUM 75 MCG PO TABS
75.0000 ug | ORAL_TABLET | Freq: Every morning | ORAL | Status: DC
  Administered 2011-12-23: 75 ug via ORAL
  Filled 2011-12-22 (×2): qty 1

## 2011-12-22 MED ORDER — METHOCARBAMOL 100 MG/ML IJ SOLN
500.0000 mg | Freq: Four times a day (QID) | INTRAVENOUS | Status: DC | PRN
  Administered 2011-12-22: 500 mg via INTRAVENOUS
  Filled 2011-12-22: qty 5

## 2011-12-22 MED ORDER — BUPIVACAINE HCL (PF) 0.25 % IJ SOLN
INTRAMUSCULAR | Status: AC
  Filled 2011-12-22: qty 30

## 2011-12-22 MED ORDER — HYDROCODONE-ACETAMINOPHEN 5-325 MG PO TABS: 1.0000 | ORAL_TABLET | ORAL | Status: DC | PRN

## 2011-12-22 MED ORDER — LISINOPRIL 20 MG PO TABS
20.0000 mg | ORAL_TABLET | Freq: Every day | ORAL | Status: DC
  Administered 2011-12-22 – 2011-12-23 (×2): 20 mg via ORAL
  Filled 2011-12-22 (×2): qty 1

## 2011-12-22 MED ORDER — TRAZODONE HCL 50 MG PO TABS
75.0000 mg | ORAL_TABLET | Freq: Every day | ORAL | Status: DC
  Administered 2011-12-22: 75 mg via ORAL
  Filled 2011-12-22 (×2): qty 1

## 2011-12-22 MED ORDER — ZOLPIDEM TARTRATE 5 MG PO TABS: 5.0000 mg | ORAL_TABLET | Freq: Every evening | ORAL | Status: DC | PRN

## 2011-12-22 MED ORDER — LACTATED RINGERS IR SOLN
Status: DC | PRN
  Administered 2011-12-22: 6000 mL

## 2011-12-22 MED ORDER — VANCOMYCIN HCL IN DEXTROSE 1-5 GM/200ML-% IV SOLN
1000.0000 mg | Freq: Once | INTRAVENOUS | Status: AC
  Administered 2011-12-22: 1000 mg via INTRAVENOUS

## 2011-12-22 MED ORDER — HYDROCHLOROTHIAZIDE 12.5 MG PO CAPS
12.5000 mg | ORAL_CAPSULE | Freq: Every day | ORAL | Status: DC
  Administered 2011-12-22 – 2011-12-23 (×2): 12.5 mg via ORAL
  Filled 2011-12-22 (×2): qty 1

## 2011-12-22 MED ORDER — LACTATED RINGERS IV SOLN
INTRAVENOUS | Status: DC | PRN
  Administered 2011-12-22: 11:00:00 via INTRAVENOUS

## 2011-12-22 MED ORDER — LACTATED RINGERS IV SOLN
INTRAVENOUS | Status: DC
  Administered 2011-12-22: 1000 mL via INTRAVENOUS

## 2011-12-22 MED ORDER — LISINOPRIL-HYDROCHLOROTHIAZIDE 20-12.5 MG PO TABS: 1.0000 | ORAL_TABLET | Freq: Every morning | ORAL | Status: DC

## 2011-12-22 MED ORDER — OXYCODONE HCL 5 MG PO TABS
5.0000 mg | ORAL_TABLET | ORAL | Status: DC | PRN
  Administered 2011-12-23: 5 mg via ORAL
  Filled 2011-12-22 (×2): qty 1

## 2011-12-22 MED ORDER — MONTELUKAST SODIUM 10 MG PO TABS
10.0000 mg | ORAL_TABLET | Freq: Every day | ORAL | Status: DC
  Administered 2011-12-22: 10 mg via ORAL
  Filled 2011-12-22 (×2): qty 1

## 2011-12-22 MED ORDER — EPINEPHRINE HCL 1 MG/ML IJ SOLN
INTRAMUSCULAR | Status: DC | PRN
  Administered 2011-12-22: 2 mL

## 2011-12-22 MED ORDER — LACTATED RINGERS IV SOLN: INTRAVENOUS | Status: DC

## 2011-12-22 SURGICAL SUPPLY — 41 items
BLADE 4.2CUDA (BLADE) ×2 IMPLANT
BLADE SURG SZ11 CARB STEEL (BLADE) ×2 IMPLANT
BOOTIES KNEE HIGH SLOAN (MISCELLANEOUS) ×3 IMPLANT
BUR OVAL 4.0 (BURR) ×1 IMPLANT
BUR VERTEX HOODED 4.5 (BURR) ×1 IMPLANT
CANNULA 5.75X7 CRYSTAL CLEAR (CANNULA) ×1 IMPLANT
CANNULA SHOULDER 7CM (CANNULA) ×1 IMPLANT
CANNULA TWIST IN 8.25X7CM (CANNULA) IMPLANT
CLOTH BEACON ORANGE TIMEOUT ST (SAFETY) ×2 IMPLANT
DRAPE SHOULDER BEACH CHAIR (DRAPES) ×2 IMPLANT
DRAPE U-SHAPE 47X51 STRL (DRAPES) ×2 IMPLANT
DURAPREP 26ML APPLICATOR (WOUND CARE) ×2 IMPLANT
GAUZE XEROFORM 1X8 LF (GAUZE/BANDAGES/DRESSINGS) ×2 IMPLANT
GLOVE BIO SURGEON STRL SZ7.5 (GLOVE) ×2 IMPLANT
GLOVE BIO SURGEON STRL SZ8 (GLOVE) ×2 IMPLANT
GLOVE BIOGEL PI IND STRL 8 (GLOVE) ×1 IMPLANT
GLOVE BIOGEL PI INDICATOR 8 (GLOVE) ×1
GLOVE ORTHO TXT STRL SZ7.5 (GLOVE) ×1 IMPLANT
GOWN PREVENTION PLUS LG XLONG (DISPOSABLE) ×1 IMPLANT
GOWN PREVENTION PLUS XLARGE (GOWN DISPOSABLE) ×1 IMPLANT
GOWN STRL NON-REIN LRG LVL3 (GOWN DISPOSABLE) ×2 IMPLANT
GOWN STRL REIN XL XLG (GOWN DISPOSABLE) ×3 IMPLANT
IV LACTATED RINGER IRRG 3000ML (IV SOLUTION) ×6
IV LR IRRIG 3000ML ARTHROMATIC (IV SOLUTION) ×3 IMPLANT
KIT SHOULDER TRACTION (DRAPES) ×1 IMPLANT
MANIFOLD NEPTUNE II (INSTRUMENTS) ×2 IMPLANT
NDL SCORPION (NEEDLE) ×1 IMPLANT
NDL SPNL 18GX3.5 QUINCKE PK (NEEDLE) ×1 IMPLANT
NEEDLE SCORPION (NEEDLE) IMPLANT
NEEDLE SPNL 18GX3.5 QUINCKE PK (NEEDLE) ×2 IMPLANT
PACK SHOULDER CUSTOM OPM052 (CUSTOM PROCEDURE TRAY) ×2 IMPLANT
POSITIONER SURGICAL ARM (MISCELLANEOUS) ×2 IMPLANT
SET ARTHROSCOPY TUBING (MISCELLANEOUS) ×2
SET ARTHROSCOPY TUBING LN (MISCELLANEOUS) ×1 IMPLANT
SLING ARM IMMOBILIZER MED (SOFTGOODS) ×1 IMPLANT
STAPLER VISISTAT 35W (STAPLE) IMPLANT
STRIP CLOSURE SKIN 1/2X4 (GAUZE/BANDAGES/DRESSINGS) IMPLANT
SUT ETHILON 3 0 PS 1 (SUTURE) ×2 IMPLANT
TOWEL OR 17X26 10 PK STRL BLUE (TOWEL DISPOSABLE) ×2 IMPLANT
TUBING CONNECTING 10 (TUBING) ×2 IMPLANT
WAND 90 DEG TURBOVAC W/CORD (SURGICAL WAND) ×2 IMPLANT

## 2011-12-22 NOTE — Progress Notes (Signed)
   CARE MANAGEMENT NOTE 12/22/2011  Patient:  ALLYSA, GOVERNALE   Account Number:  [id number]  Date Initiated:  12/22/2011  Documentation initiated by:  Lanier Clam  Subjective/Objective Assessment:   ADMITTED-L SHOULDER ARTHROSCOPY/DECOMPRESSION     Action/Plan:   FROM HOME   Anticipated DC Date:  12/23/2011   Anticipated DC Plan:  HOME/SELF CARE  In-house referral  NA      DC Planning Services  NA      [facility] Choice  NA   Choice offered to / List presented to:  NA        HH arranged  NA      Status of service:  In process, will continue to follow Medicare Important Message given?   (If response is "NO", the following Medicare IM given date fields will be blank) Date Medicare IM given:   Date Additional Medicare IM given:    Discharge Disposition:    Per UR Regulation:    If discussed at Long Length of Stay Meetings, dates discussed:    Comments:  12/22/11 [facility] Darlisa Spruiell RN,BSN NCM [phone]

## 2011-12-22 NOTE — Anesthesia Postprocedure Evaluation (Signed)
  Anesthesia Post-op Note  Patient: Sabrina [name]  Procedure(s) Performed: Procedure(s) (LRB): SHOULDER ARTHROSCOPY WITH SUBACROMIAL DECOMPRESSION (Left)  Patient Location: PACU  Anesthesia Type: General  Level of Consciousness: awake and alert   Airway and Oxygen Therapy: Patient Spontanous Breathing  Post-op Pain: mild  Post-op Assessment: Post-op Vital signs reviewed, Patient's Cardiovascular Status Stable, Respiratory Function Stable, Patent Airway and No signs of Nausea or vomiting  Post-op Vital Signs: stable  Complications: No apparent anesthesia complications. Some shivering treated with Demerol. No SOB, No Weakness reported.

## 2011-12-22 NOTE — H&P (Signed)
Sabrina [name] is an 71 y.o. female.   Chief Complaint:   Severe left shoulder pain HPI:   71 yo female with worsening left shoulder pain.  MRI shows small area with full-thickness RTC tear and also significant bursitis, tendonitits, synovitis, and OA.  She has failed several injections and now wishes to proceed with an arthroscopic intervention.  She understands the risks and benefits involved.  Past Medical History  Diagnosis Date  . Asthma   . Thyroid disease   . Myasthenia gravis   . Complication of anesthesia     BP FALLS/"ANEPHALACTIC SHOCK"/ EXTREME TREMORS  . Hypertension   . Arthritis   . History of skin cancer   . Hypothyroidism   . Anxiety   . Depression     Past Surgical History  Procedure Date  . Tonsillectomy and adenoidectomy   . Tubal ligation   . Cholecystectomy   . Cerebral aneurysm repair   . Total hip arthroplasty     right  . Joint replacement     RT HIP    Family History  Problem Relation Age of Onset  . Hypertension Mother   . Heart disease Mother     heart attack  . Stroke Father   . Heart disease Father     heart attack   Social History:  reports that she has never smoked. She does not have any smokeless tobacco history on file. She reports that she drinks alcohol. She reports that she does not use illicit drugs.  Allergies:  Allergies  Allergen Reactions  . Other Anaphylaxis    Sodium pentathol  . Aminoglycosides     Tobramycin, gentamycin, kanamycin, neomycin, streptomycin  . Avelox (Moxifloxacin Hcl In Nacl) Other (See Comments)    Cannot take d/t MG  . Beta Adrenergic Blockers     Proponolol, timolol maleate eyedrops  . Calcium Channel Blockers     Blood pressure medications   . Cephalosporins     No paralytics also!  . Ciprofloxacin   . Colistin   . Imuran (Azathioprine Sodium) Other (See Comments)    Kidneys shut down  . Iodinated Diagnostic Agents   . Iodine     Iodine contrast   . Macrolides And Ketolides    Erythromocin, azithromycin, telithromycin)  . Magnesium-Containing Compounds     Including milk of magnesia, antacids containing magnesium hydroxide (maalox, mylanta) and epsom salts   . Norfloxacin   . Ofloxacin   . Pefloxacin   . Procainamide   . Quinidine   . Quinine Derivatives   . Succinylcholine   . Tubocurarine   . Vecuronium     Medications Prior to Admission  Medication Dose Route Frequency Provider Last Rate Last Dose  . lactated ringers infusion   Intravenous Continuous Azell Der, MD      . vancomycin (VANCOCIN) IVPB 1000 mg/200 mL premix  1,000 mg Intravenous Once Kathryne Hitch, MD      . DISCONTD: clindamycin (CLEOCIN) IVPB 600 mg  600 mg Intravenous 60 min Pre-Op Kathryne Hitch, MD       Medications Prior to Admission  Medication Sig Dispense Refill  . ALPRAZolam (XANAX) 0.5 MG tablet Take 0.5 mg by mouth every 6 (six) hours.        . Ascorbic Acid (VITAMIN C) 1000 MG tablet Take 1,000 mg by mouth daily.        . Calcium Carbonate (CALTRATE 600 PO) Take 600 mg by mouth 2 (two) times daily.       Marland Kitchen  Cholecalciferol (VITAMIN D-3 PO) Take 1,000 mg by mouth daily.        Marland Kitchen FLUoxetine (PROZAC) 20 MG tablet Take 20 mg by mouth every morning.       . Fluticasone-Salmeterol (ADVAIR DISKUS) 250-50 MCG/DOSE AEPB Inhale 1 puff into the lungs every 12 (twelve) hours.        . Glucosamine-Chondroitin (GLUCOSAMINE CHONDR COMPLEX PO) Take 1 tablet by mouth every morning.       . Ibuprofen-Diphenhydramine Cit (ADVIL PM PO) Take 1 tablet by mouth at bedtime.       Marland Kitchen levothyroxine (SYNTHROID, LEVOTHROID) 75 MCG tablet Take 75 mcg by mouth every morning.       Marland Kitchen lisinopril-hydrochlorothiazide (PRINZIDE,ZESTORETIC) 20-12.5 MG per tablet Take 1 tablet by mouth every morning.       . montelukast (SINGULAIR) 10 MG tablet Take 10 mg by mouth at bedtime.        . mycophenolate (CELLCEPT) 500 MG tablet Take 1,000 mg by mouth 2 (two) times daily.       . Omega-3 Fatty  Acids (FISH OIL PO) Take 1,200 mg by mouth daily.        . traZODone (DESYREL) 150 MG tablet Take 75 mg by mouth at bedtime.          Results for orders placed during the hospital encounter of 12/21/11 (from the past 48 hour(s))  SURGICAL PCR SCREEN     Status: Normal   Collection Time   12/21/11  9:56 AM      Component Value Range Comment   MRSA, PCR NEGATIVE  NEGATIVE     Staphylococcus aureus NEGATIVE  NEGATIVE    CBC     Status: Abnormal   Collection Time   12/21/11 12:30 PM      Component Value Range Comment   WBC 8.8  4.0 - 10.5 (K/uL)    RBC 4.62  3.87 - 5.11 (MIL/uL)    Hemoglobin 14.2  12.0 - 15.0 (g/dL)    HCT 16.1  09.6 - 04.5 (%)    MCV 90.5  78.0 - 100.0 (fL)    MCH 30.7  26.0 - 34.0 (pg)    MCHC 34.0  30.0 - 36.0 (g/dL)    RDW 40.9 (*) 81.1 - 15.5 (%)    Platelets 381  150 - 400 (K/uL)   BASIC METABOLIC PANEL     Status: Abnormal   Collection Time   12/21/11 12:30 PM      Component Value Range Comment   Sodium 138  135 - 145 (mEq/L)    Potassium 4.1  3.5 - 5.1 (mEq/L)    Chloride 101  96 - 112 (mEq/L)    CO2 26  19 - 32 (mEq/L)    Glucose, Bld 86  70 - 99 (mg/dL)    BUN 15  6 - 23 (mg/dL)    Creatinine, Ser 9.14  0.50 - 1.10 (mg/dL)    Calcium 78.2  8.4 - 10.5 (mg/dL)    GFR calc non Af Amer 69 (*) >90 (mL/min)    GFR calc Af Amer 80 (*) >90 (mL/min)    Dg Chest 2 View  12/21/2011  *RADIOLOGY REPORT*  Clinical Data: Preop left shoulder arthroscopy.  Hypertension.  CHEST - 2 VIEW  Comparison: 05/22/2007  Findings: Heart and mediastinal contours are within normal limits. No focal opacities or effusions.  No acute bony abnormality. Stable thoracolumbar scoliosis.  Mild tortuosity of the thoracic aorta.  IMPRESSION: No active cardiopulmonary disease.  Original Report  Authenticated By: Cyndie Chime, M.D.    Review of Systems  All other systems reviewed and are negative.    Blood pressure 146/96, pulse 81, temperature 97.3 F (36.3 C), resp. rate 20, SpO2  97.00%. Physical Exam  Constitutional: She is oriented to person, place, and time. She appears well-developed and well-nourished.  HENT:  Head: Normocephalic and atraumatic.  Eyes: EOM are normal. Pupils are equal, round, and reactive to light.  Neck: Normal range of motion. Neck supple.  Cardiovascular: Normal rate and regular rhythm.   Respiratory: Effort normal and breath sounds normal.  GI: Soft. Bowel sounds are normal.  Musculoskeletal:       Left shoulder: She exhibits decreased range of motion, tenderness, bony tenderness, effusion and decreased strength.  Neurological: She is alert and oriented to person, place, and time.  Skin: Skin is warm and dry.  Psychiatric: She has a normal mood and affect.     Assessment/Plan To the OR for a left shoulder arthroscopy.  Torri Michalski Y 12/22/2011, 10:45 AM

## 2011-12-22 NOTE — Brief Op Note (Signed)
12/22/2011  12:32 PM  PATIENT:  Sabrina [name]  71 y.o. female  PRE-OPERATIVE DIAGNOSIS:  Left shoulder severe impingement syndrome  POST-OPERATIVE DIAGNOSIS:  Left shoulder severe impingement syndrome  PROCEDURE:  Procedure(s) (LRB): SHOULDER ARTHROSCOPY WITH SUBACROMIAL DECOMPRESSION (Left)  SURGEON:  Surgeon(s) and Role:    * Kathryne Hitch, MD - Primary  PHYSICIAN ASSISTANT:   ASSISTANTS: none   ANESTHESIA:   local and general  EBL:   minimal  BLOOD ADMINISTERED:none  DRAINS: none   LOCAL MEDICATIONS USED:  MARCAINE     SPECIMEN:  No Specimen  DISPOSITION OF SPECIMEN:  N/A  COUNTS:  YES  TOURNIQUET:  * No tourniquets in log *  DICTATION: .Other Dictation: Dictation Number [id number]  PLAN OF CARE: Admit for overnight observation  PATIENT DISPOSITION:  PACU - hemodynamically stable.   Delay start of Pharmacological VTE agent (>24hrs) due to surgical blood loss or risk of bleeding: not applicable

## 2011-12-22 NOTE — Transfer of Care (Signed)
Immediate Anesthesia Transfer of Care Note  Patient: Sabrina [name]  Procedure(s) Performed: Procedure(s) (LRB): SHOULDER ARTHROSCOPY WITH SUBACROMIAL DECOMPRESSION (Left)  Patient Location: PACU  Anesthesia Type: General  Level of Consciousness: sedated, patient cooperative and responds to stimulaton  Airway & Oxygen Therapy: Patient Spontanous Breathing and Patient connected to face mask oxgen  Post-op Assessment: Report given to PACU RN and Post -op Vital signs reviewed and stable  Post vital signs: Reviewed and stable  Complications: No apparent anesthesia complications

## 2011-12-23 MED ORDER — ONDANSETRON HCL 4 MG PO TABS: 4.0000 mg | ORAL_TABLET | Freq: Three times a day (TID) | ORAL | Status: AC | PRN

## 2011-12-23 MED ORDER — OXYCODONE-ACETAMINOPHEN 5-325 MG PO TABS: 1.0000 | ORAL_TABLET | ORAL | Status: AC | PRN

## 2011-12-23 NOTE — Plan of Care (Signed)
Problem: Discharge Progression Outcomes Goal: Staples/sutures removed Outcome: Progressing F/U with MD out patient

## 2011-12-23 NOTE — Discharge Summary (Signed)
Patient ID: Sabrina [name] MRN: [id number] DOB/AGE: 1941/06/05 71 y.o.  Admit date: 12/22/2011 Discharge date: 12/23/2011  Admission Diagnoses:  Principal Problem:  *Rotator cuff impingement syndrome   Discharge Diagnoses:  Same  Past Medical History  Diagnosis Date  . Asthma   . Thyroid disease   . Myasthenia gravis   . Complication of anesthesia     BP FALLS/"ANEPHALACTIC SHOCK"/ EXTREME TREMORS  . Hypertension   . Arthritis   . History of skin cancer   . Hypothyroidism   . Anxiety   . Depression     Surgeries: Procedure(s): SHOULDER ARTHROSCOPY WITH SUBACROMIAL DECOMPRESSION on 12/22/2011   Consultants:    Discharged Condition: Improved  Hospital Course: Sabrina [name] is an 71 y.o. female who was admitted 12/22/2011 for operative treatment ofRotator cuff impingement syndrome. Patient has severe unremitting pain that affects sleep, daily activities, and work/hobbies. After pre-op clearance the patient was taken to the operating room on 12/22/2011 and underwent  Procedure(s): SHOULDER ARTHROSCOPY WITH SUBACROMIAL DECOMPRESSION.    Patient was given perioperative antibiotics: Anti-infectives     Start     Dose/Rate Route Frequency Ordered Stop   12/22/11 1045   vancomycin (VANCOCIN) IVPB 1000 mg/200 mL premix        1,000 mg 200 mL/hr over 60 Minutes Intravenous  Once 12/22/11 1044 12/22/11 1125   12/22/11 0928   clindamycin (CLEOCIN) IVPB 600 mg  Status:  Discontinued        600 mg 100 mL/hr over 30 Minutes Intravenous 60 min pre-op 12/22/11 0928 12/22/11 1044           Patient was given sequential compression devices, early ambulation, and chemoprophylaxis to prevent DVT.  Patient benefited maximally from hospital stay and there were no complications.    Recent vital signs: Patient Vitals for the past 24 hrs:  BP Temp Temp src Pulse Resp SpO2 Height Weight  12/23/11 0818 - - - - - 96 % - -  12/23/11 0523 102/66 mmHg 98.6 F (37 C) Oral 67  14  98 % - -    12/22/11 2127 125/72 mmHg 99.2 F (37.3 C) Oral 63  16  99 % - -  12/22/11 1930 - - - - - 98 % - -  12/22/11 1513 - - - - - - 5\' 6"  (1.676 m) 89.359 kg (197 lb)  12/22/11 1405 133/76 mmHg 97.9 F (36.6 C) Oral 63  14  100 % - -  12/22/11 1345 146/73 mmHg 97.7 F (36.5 C) - 63  14  100 % - -  12/22/11 1330 136/86 mmHg - - 65  9  100 % - -  12/22/11 1320 - - - 67  10  100 % - -  12/22/11 1315 145/77 mmHg - - 68  14  100 % - -  12/22/11 1300 147/70 mmHg - - 68  12  100 % - -  12/22/11 1245 131/70 mmHg - - 75  10  100 % - -  12/22/11 1235 - 97.6 F (36.4 C) - 82  15  100 % - -  12/22/11 1230 142/73 mmHg - - - - - - -     Recent laboratory studies:  Basename 12/21/11 1230  WBC 8.8  HGB 14.2  HCT 41.8  PLT 381  NA 138  K 4.1  CL 101  CO2 26  BUN 15  CREATININE 0.83  GLUCOSE 86  INR --  CALCIUM 10.0     Discharge Medications:  Medication List  As of 12/23/2011 10:00 AM   TAKE these medications         ADVAIR DISKUS 250-50 MCG/DOSE Aepb   Generic drug: Fluticasone-Salmeterol   Inhale 1 puff into the lungs every 12 (twelve) hours.      ADVIL PM PO   Take 1 tablet by mouth at bedtime.      ALPRAZolam 0.5 MG tablet   Commonly known as: XANAX   Take 0.5 mg by mouth every 6 (six) hours.      CALTRATE 600 PO   Take 600 mg by mouth 2 (two) times daily.      CELLCEPT 500 MG tablet   Generic drug: mycophenolate   Take 1,000 mg by mouth 2 (two) times daily.      FISH OIL PO   Take 1,200 mg by mouth daily.      FLUoxetine 20 MG tablet   Commonly known as: PROZAC   Take 20 mg by mouth every morning.      GLUCOSAMINE CHONDR COMPLEX PO   Take 1 tablet by mouth every morning.      levothyroxine 75 MCG tablet   Commonly known as: SYNTHROID, LEVOTHROID   Take 75 mcg by mouth every morning.      lisinopril-hydrochlorothiazide 20-12.5 MG per tablet   Commonly known as: PRINZIDE,ZESTORETIC   Take 1 tablet by mouth every morning.      montelukast 10 MG tablet    Commonly known as: SINGULAIR   Take 10 mg by mouth at bedtime.      mulitivitamin with minerals Tabs   Take 1 tablet by mouth daily.      ondansetron 4 MG tablet   Commonly known as: ZOFRAN   Take 1 tablet (4 mg total) by mouth every 8 (eight) hours as needed for nausea.      oxyCODONE-acetaminophen 5-325 MG per tablet   Commonly known as: PERCOCET   Take 1 tablet by mouth every 4 (four) hours as needed for pain.      SYSTANE OP   Apply 1 drop to eye every morning.      traMADol 50 MG tablet   Commonly known as: ULTRAM   Take 50 mg by mouth every 6 (six) hours as needed. For pain      traZODone 150 MG tablet   Commonly known as: DESYREL   Take 75 mg by mouth at bedtime.      vitamin C 1000 MG tablet   Take 1,000 mg by mouth daily.      VITAMIN D-3 PO   Take 1,000 mg by mouth daily.            Diagnostic Studies: Dg Chest 2 View  12/21/2011  *RADIOLOGY REPORT*  Clinical Data: Preop left shoulder arthroscopy.  Hypertension.  CHEST - 2 VIEW  Comparison: 05/22/2007  Findings: Heart and mediastinal contours are within normal limits. No focal opacities or effusions.  No acute bony abnormality. Stable thoracolumbar scoliosis.  Mild tortuosity of the thoracic aorta.  IMPRESSION: No active cardiopulmonary disease.  Original Report Authenticated By: Cyndie Chime, M.D.   Mr Shoulder Left Wo Contrast  11/27/2011  *RADIOLOGY REPORT*  Clinical Data: Shoulder pain with limited range of motion for 3 months.  No acute injury or prior relevant surgery.  MRI OF THE LEFT SHOULDER WITHOUT CONTRAST  Technique:  Multiplanar, multisequence MR imaging was performed. No intravenous contrast was administered.  Comparison: Chest CT 08/11/2010.  Findings: There is a small shoulder joint  effusion with a moderate- to-large amount of fluid in the subacromial - subdeltoid fluid. This fluid likely communicates with the acromioclavicular joint. There is evidence of diffuse synovitis, especially within the  biceps tendon sheath and medial to the distal clavicle.  There is evidence of underlying glenohumeral arthropathy with heterogeneous edema in the humeral head and glenoid.  No fracture or well-defined erosion is identified.  There is a linear subchondral low signal on the coronal images which could reflect osteonecrosis.  There is no subchondral collapse. The superior labrum is diffusely blunted.  The long head biceps tendon appears completely ruptured.  There is diffuse supraspinatus tendinosis.  There is a small full- thickness non insertional tear of the tendon which is best seen on the sagittal images.  Although there is no significant tendon retraction, the supraspinatus muscle demonstrates mild to moderate atrophy.  The infraspinatus and subscapularis tendons appear intact.  The acromion is type 2.  There are moderate acromioclavicular degenerative changes.  IMPRESSION:  1.  Small full-thickness incomplete tear of the supraspinatus tendon is associated with muscular atrophy and is likely longstanding.  The remainder of the rotator cuff appears intact. 2.  Complete rupture of the long head of the biceps tendon with blunting of the superior labrum. 3.  Complex fluid within the shoulder joint, subacromial and subdeltoid bursa consistent with synovitis. There is associated glenohumeral arthropathy with subchondral edema which could reflect an inflammatory arthropathy.  Underlying osteonecrosis of the humeral head is difficult to exclude.  Original Report Authenticated By: Gerrianne Scale, M.D.    Disposition: to home  Discharge Orders    Future Orders Please Complete By Expires   Diet - low sodium heart healthy      Call MD / Call 911      Comments:   If you experience chest pain or shortness of breath, CALL 911 and be transported to the hospital emergency room.  If you develope a fever above 101 F, pus (white drainage) or increased drainage or redness at the wound, or calf pain, call your surgeon's  office.   Constipation Prevention      Comments:   Drink plenty of fluids.  Prune juice may be helpful.  You may use a stool softener, such as Colace (over the counter) 100 mg twice a day.  Use MiraLax (over the counter) for constipation as needed.   Increase activity slowly as tolerated      Discharge instructions      Comments:   You can get your incisions wet in the shower.  Place daily band-aids as needed. Follow-up in one week at [facility]. Increase your activities as comfort allows.   Discharge patient            Signed: Kathryne Hitch 12/23/2011, 10:00 AM

## 2011-12-23 NOTE — Progress Notes (Signed)
Subjective: 1 Day Post-Op Procedure(s) (LRB): SHOULDER ARTHROSCOPY WITH SUBACROMIAL DECOMPRESSION (Left) Patient reports pain as mild.    Objective: Vital signs in last 24 hours: Temp:  [97.6 F (36.4 C)-99.2 F (37.3 C)] 98.6 F (37 C) (04/20 0523) Pulse Rate:  [63-82] 67  (04/20 0523) Resp:  [9-16] 14  (04/20 0523) BP: (102-147)/(66-86) 102/66 mmHg (04/20 0523) SpO2:  [96 %-100 %] 96 % (04/20 0818) FiO2 (%):  [21 %] 21 % (04/20 0818) Weight:  [89.359 kg (197 lb)] 89.359 kg (197 lb) (04/19 1513)  Intake/Output from previous day: 04/19 0701 - 04/20 0700 In: 1385 [P.O.:480; I.V.:850; IV Piggyback:55] Out: 1350 [Urine:1350] Intake/Output this shift: Total I/O In: -  Out: 300 [Urine:300]   Basename 12/21/11 1230  HGB 14.2    Basename 12/21/11 1230  WBC 8.8  RBC 4.62  HCT 41.8  PLT 381    Basename 12/21/11 1230  NA 138  K 4.1  CL 101  CO2 26  BUN 15  CREATININE 0.83  GLUCOSE 86  CALCIUM 10.0   No results found for this basename: LABPT:2,INR:2 in the last 72 hours  Sensation intact distally Intact pulses distally Incision: no drainage  Assessment/Plan: 1 Day Post-Op Procedure(s) (LRB): SHOULDER ARTHROSCOPY WITH SUBACROMIAL DECOMPRESSION (Left) Discharge to home today.  Jazzmine Kleiman Y 12/23/2011, 9:56 AM

## 2011-12-23 NOTE — Op Note (Signed)
NAMEBRECKEN, [name]                 ACCOUNT NO.:  [id number]  MEDICAL RECORD NO.:  [id number]  LOCATION:  1416                         FACILITY:  [facility]  PHYSICIAN:  Vanita Panda. Magnus Ivan, M.D.DATE OF BIRTH:  Nov 06, 1940  DATE OF PROCEDURE:  12/22/2011 DATE OF DISCHARGE:                              OPERATIVE REPORT   PREOPERATIVE DIAGNOSIS:  Severe left shoulder pain with known partial- thickness rotator cuff tear, glenohumeral arthritis, and significant synovitis.  POSTOPERATIVE DIAGNOSIS:  Severe left shoulder pain with known partial- thickness rotator cuff tear, glenohumeral arthritis, and significant synovitis.  FINDINGS:  Severe glenohumeral arthritis with significant synovitis, rotator cuff tearing and rotator cuff tendinosis.  SURGEON:  Vanita Panda. Magnus Ivan, M.D.  ANESTHESIA: 1. General. 2. Local, 0.25% plain Sensorcaine.  BLOOD LOSS:  Minimal.  COMPLICATIONS:  None.  INDICATIONS:  Sabrina [name] is a 71 year old patient well known to me.  I have known her for many years.  She has actually had a first right anterior hip surgery, this was performed in [place].  She has been having left shoulder problems for some time now, and it is really getting painful with her reaching behind and overhead.  I have injected the shoulder on numerous occasions and she has been on anti- inflammatories and tried to work her shoulder out through therapy, but it got to be so severely painful I did obtain an MRI of her shoulder. The MRI did show significant synovitis of the shoulder with glenohumeral arthritis as well as AC joint arthritis.  There was synovitis throughout the shoulder and a large effusion and at least a small full-thickness rotator cuff tear.  Given all these findings, I talked to her about the possible need for shoulder replacement and told her I could refer her for that, but she decided she would just have an arthroscopic intervention to see if we can clean her  shoulder out with getting rid of the synovitis and go on from there.  The risks and benefits of this were explained to her in full detail and she did wish to proceed with surgery.  DESCRIPTION OF PROCEDURE:  After informed consent was obtained, appropriate left shoulder was marked.  She was brought to the operating room and placed supine on the operating table.  General anesthesia was then obtained.  She was then fashioned into a beach chair position with normal positioning of the head and neck and padding done of the nonoperative right arm.  There was bending at the waist and knees and palpable pulses in her feet.  Her left arm was then placed in in-line skeletal traction using a fishing pole traction device and 10 pounds of traction after prepping with DuraPrep and sterile drapes.  Time-out was called and she was identified as the correct patient, and correct left shoulder.  I then made a posterolateral arthroscopy portal and entered the glenohumeral joint.  There was an effusion that was drained from the joint and I placed the camera in the joint.  By the way we could see there were areas on the humeral head with a large cartilage flap that was ready to break off.  There was significantly inflamed synovium throughout the shoulder.  We could see her proximal biceps tendon had ruptured already and she had evidence of a full-thickness rotator cuff tear.  I made an anterior portal in the rotator interval and placed an arthroscopic shaver as well as a soft tissue ablation device and thoroughly cleaned out the synovium within the glenohumeral joint.  I used a grasper as well as a shaver to remove 2 large chunks of cartilage off the humeral head.  I then entered the subacromial space.  The poster portal made a separate lateral portal.  There was significant thickening of the rotator cuff and areas of full-thickness tear with no significant retraction at all, but it was quite extensive in  several areas of the cuff with actually some of the posterior attachments to the greater tuberosity intact, it was just a very odd tear with a cleavage part of this as well.  There was good space between the humeral head and the acromion.  I did debride this area out significantly, but then at that point I decided not to proceed with any type of repair given the fact that I was concerned about tissue integrity and did not want to compromise anything further given the possibility that she would need a shoulder replacement at some point.  I then removed all instrumentation and after I allowed fluid to lavage the shoulder I closed the 3 portal sites with interrupted nylon suture.  Xeroform followed with well-padded sterile dressing was applied.  She was placed in a sling, awakened, extubated, and taken to recovery room in stable condition.  All final counts were correct.  There were no complications noted.     Vanita Panda. Magnus Ivan, M.D.     CYB/MEDQ  D:  12/22/2011  T:  12/23/2011  Job:  [id number]

## 2011-12-23 NOTE — Progress Notes (Signed)
Patient discharged home with sister, discharge instructions given and explained to patient and she verbalized understanding, Denies any distress, surgical incision clean dry and intact no sign of infection, patient to F/U with MD outpatient. No other wound noted. Accompanied home by sister and transported to the car by staff via Wheelchair.

## 2012-01-11 DIAGNOSIS — L253 Unspecified contact dermatitis due to other chemical products: Secondary | ICD-10-CM | POA: Diagnosis not present

## 2012-01-11 DIAGNOSIS — L538 Other specified erythematous conditions: Secondary | ICD-10-CM | POA: Diagnosis not present

## 2012-01-25 DIAGNOSIS — G7000 Myasthenia gravis without (acute) exacerbation: Secondary | ICD-10-CM | POA: Diagnosis not present

## 2012-05-01 DIAGNOSIS — L98499 Non-pressure chronic ulcer of skin of other sites with unspecified severity: Secondary | ICD-10-CM | POA: Diagnosis not present

## 2012-05-01 DIAGNOSIS — L538 Other specified erythematous conditions: Secondary | ICD-10-CM | POA: Diagnosis not present

## 2012-05-27 ENCOUNTER — Other Ambulatory Visit (HOSPITAL_COMMUNITY): Payer: Self-pay | Admitting: Orthopaedic Surgery

## 2012-05-27 DIAGNOSIS — M67919 Unspecified disorder of synovium and tendon, unspecified shoulder: Secondary | ICD-10-CM | POA: Diagnosis not present

## 2012-05-27 DIAGNOSIS — M25519 Pain in unspecified shoulder: Secondary | ICD-10-CM | POA: Diagnosis not present

## 2012-05-27 DIAGNOSIS — M76899 Other specified enthesopathies of unspecified lower limb, excluding foot: Secondary | ICD-10-CM | POA: Diagnosis not present

## 2012-05-27 DIAGNOSIS — G5600 Carpal tunnel syndrome, unspecified upper limb: Secondary | ICD-10-CM | POA: Diagnosis not present

## 2012-05-27 DIAGNOSIS — M719 Bursopathy, unspecified: Secondary | ICD-10-CM | POA: Diagnosis not present

## 2012-05-28 ENCOUNTER — Encounter (HOSPITAL_COMMUNITY): Payer: Self-pay | Admitting: Pharmacy Technician

## 2012-05-28 ENCOUNTER — Encounter (HOSPITAL_COMMUNITY): Payer: Self-pay | Admitting: *Deleted

## 2012-05-31 ENCOUNTER — Ambulatory Visit (HOSPITAL_COMMUNITY)
Admission: RE | Admit: 2012-05-31 | Discharge: 2012-05-31 | Disposition: A | Discharge status: Home / Self Care | Payer: Medicare Other | Source: Ambulatory Visit | Attending: Orthopaedic Surgery | Admitting: Orthopaedic Surgery

## 2012-05-31 ENCOUNTER — Ambulatory Visit (HOSPITAL_COMMUNITY): Payer: Medicare Other | Admitting: Anesthesiology

## 2012-05-31 ENCOUNTER — Encounter (HOSPITAL_COMMUNITY): Payer: Self-pay | Admitting: Anesthesiology

## 2012-05-31 ENCOUNTER — Encounter (HOSPITAL_COMMUNITY)
Admission: RE | Disposition: A | Discharge status: Home / Self Care | Payer: Self-pay | Source: Ambulatory Visit | Attending: Orthopaedic Surgery

## 2012-05-31 ENCOUNTER — Encounter (HOSPITAL_COMMUNITY): Payer: Self-pay | Admitting: *Deleted

## 2012-05-31 DIAGNOSIS — G5600 Carpal tunnel syndrome, unspecified upper limb: Secondary | ICD-10-CM | POA: Diagnosis not present

## 2012-05-31 DIAGNOSIS — Z79899 Other long term (current) drug therapy: Secondary | ICD-10-CM | POA: Diagnosis not present

## 2012-05-31 DIAGNOSIS — E039 Hypothyroidism, unspecified: Secondary | ICD-10-CM | POA: Insufficient documentation

## 2012-05-31 DIAGNOSIS — G5601 Carpal tunnel syndrome, right upper limb: Secondary | ICD-10-CM

## 2012-05-31 DIAGNOSIS — Z96649 Presence of unspecified artificial hip joint: Secondary | ICD-10-CM | POA: Diagnosis not present

## 2012-05-31 DIAGNOSIS — I10 Essential (primary) hypertension: Secondary | ICD-10-CM | POA: Diagnosis not present

## 2012-05-31 HISTORY — PX: CARPAL TUNNEL RELEASE: SHX101

## 2012-05-31 LAB — BASIC METABOLIC PANEL
CO2: 25 mEq/L (ref 19–32)
Chloride: 101 mEq/L (ref 96–112)
Creatinine, Ser: 0.82 mg/dL (ref 0.50–1.10)
GFR calc Af Amer: 81 mL/min — ABNORMAL LOW (ref >90)
Potassium: 3.6 mEq/L (ref 3.5–5.1)
Sodium: 137 mEq/L (ref 135–145)

## 2012-05-31 LAB — CBC
Platelets: 389 10*3/uL (ref 150–400)
RBC: 4.8 MIL/uL (ref 3.87–5.11)
RDW: 15.4 % (ref 11.5–15.5)
WBC: 8.1 10*3/uL (ref 4.0–10.5)

## 2012-05-31 SURGERY — CARPAL TUNNEL RELEASE
Anesthesia: Monitor Anesthesia Care | Laterality: Left | Wound class: Clean

## 2012-05-31 MED ORDER — LIDOCAINE HCL 1 % IJ SOLN
INTRAMUSCULAR | Status: AC
  Filled 2012-05-31: qty 20

## 2012-05-31 MED ORDER — FENTANYL CITRATE 0.05 MG/ML IJ SOLN
INTRAMUSCULAR | Status: DC | PRN
  Administered 2012-05-31 (×3): 25 ug via INTRAVENOUS

## 2012-05-31 MED ORDER — LACTATED RINGERS IV SOLN
INTRAVENOUS | Status: DC
  Administered 2012-05-31: 1000 mL via INTRAVENOUS

## 2012-05-31 MED ORDER — HYDROCODONE-ACETAMINOPHEN 5-325 MG PO TABS: 1.0000 | ORAL_TABLET | Freq: Four times a day (QID) | ORAL | Status: DC | PRN

## 2012-05-31 MED ORDER — PROPOFOL 10 MG/ML IV EMUL
INTRAVENOUS | Status: DC | PRN
  Administered 2012-05-31: 50 ug/kg/min via INTRAVENOUS

## 2012-05-31 MED ORDER — LACTATED RINGERS IV SOLN
INTRAVENOUS | Status: DC | PRN
  Administered 2012-05-31: 15:00:00 via INTRAVENOUS

## 2012-05-31 MED ORDER — LIDOCAINE HCL 1 % IJ SOLN
INTRAMUSCULAR | Status: DC | PRN
  Administered 2012-05-31: 5 mL

## 2012-05-31 MED ORDER — PROMETHAZINE HCL 25 MG/ML IJ SOLN: 6.2500 mg | INTRAMUSCULAR | Status: DC | PRN

## 2012-05-31 MED ORDER — BUPIVACAINE HCL (PF) 0.25 % IJ SOLN
INTRAMUSCULAR | Status: DC | PRN
  Administered 2012-05-31: 5 mL

## 2012-05-31 MED ORDER — VANCOMYCIN HCL IN DEXTROSE 1-5 GM/200ML-% IV SOLN
INTRAVENOUS | Status: AC
  Filled 2012-05-31: qty 200

## 2012-05-31 MED ORDER — BUPIVACAINE HCL (PF) 0.25 % IJ SOLN
INTRAMUSCULAR | Status: AC
  Filled 2012-05-31: qty 30

## 2012-05-31 MED ORDER — FENTANYL CITRATE 0.05 MG/ML IJ SOLN: 25.0000 ug | INTRAMUSCULAR | Status: DC | PRN

## 2012-05-31 MED ORDER — MUPIROCIN 2 % EX OINT
TOPICAL_OINTMENT | Freq: Two times a day (BID) | CUTANEOUS | Status: DC
  Filled 2012-05-31: qty 22

## 2012-05-31 MED ORDER — MIDAZOLAM HCL 5 MG/5ML IJ SOLN
INTRAMUSCULAR | Status: DC | PRN
  Administered 2012-05-31: 0.5 mg via INTRAVENOUS

## 2012-05-31 MED ORDER — 0.9 % SODIUM CHLORIDE (POUR BTL) OPTIME
TOPICAL | Status: DC | PRN
  Administered 2012-05-31: 500 mL

## 2012-05-31 SURGICAL SUPPLY — 28 items
BANDAGE ELASTIC 3 VELCRO ST LF (GAUZE/BANDAGES/DRESSINGS) ×3 IMPLANT
BANDAGE ELASTIC 4 VELCRO ST LF (GAUZE/BANDAGES/DRESSINGS) ×2 IMPLANT
BANDAGE GAUZE ELAST BULKY 4 IN (GAUZE/BANDAGES/DRESSINGS) ×2 IMPLANT
CLOTH BEACON ORANGE TIMEOUT ST (SAFETY) ×2 IMPLANT
CORDS BIPOLAR (ELECTRODE) ×2 IMPLANT
CUFF TOURN SGL QUICK 18 (TOURNIQUET CUFF) IMPLANT
CUFF TOURN SGL QUICK 24 (TOURNIQUET CUFF)
CUFF TRNQT CYL 24X4X40X1 (TOURNIQUET CUFF) IMPLANT
DRAPE SURG 17X11 SM STRL (DRAPES) IMPLANT
DURAPREP 26ML APPLICATOR (WOUND CARE) ×2 IMPLANT
GAUZE XEROFORM 1X8 LF (GAUZE/BANDAGES/DRESSINGS) ×2 IMPLANT
GLOVE ORTHO TXT STRL SZ7.5 (GLOVE) ×2 IMPLANT
GOWN STRL REIN XL XLG (GOWN DISPOSABLE) ×2 IMPLANT
KIT BASIN OR (CUSTOM PROCEDURE TRAY) ×2 IMPLANT
NEEDLE HYPO 22GX1.5 SAFETY (NEEDLE) IMPLANT
NS IRRIG 1000ML POUR BTL (IV SOLUTION) ×2 IMPLANT
PACK LOWER EXTREMITY WL (CUSTOM PROCEDURE TRAY) ×2 IMPLANT
PAD CAST 3X4 CTTN HI CHSV (CAST SUPPLIES) IMPLANT
PAD CAST 4YDX4 CTTN HI CHSV (CAST SUPPLIES) ×2 IMPLANT
PADDING CAST COTTON 3X4 STRL (CAST SUPPLIES) ×2
PADDING CAST COTTON 4X4 STRL (CAST SUPPLIES) ×4
POSITIONER SURGICAL ARM (MISCELLANEOUS) ×2 IMPLANT
SPONGE GAUZE 4X4 12PLY (GAUZE/BANDAGES/DRESSINGS) ×2 IMPLANT
SUCTION FRAZIER TIP 10 FR DISP (SUCTIONS) ×2 IMPLANT
SUT ETHILON 4 0 PS 2 18 (SUTURE) ×2 IMPLANT
SYR CONTROL 10ML LL (SYRINGE) IMPLANT
TOWEL OR 17X26 10 PK STRL BLUE (TOWEL DISPOSABLE) ×4 IMPLANT
WATER STERILE IRR 1500ML POUR (IV SOLUTION) ×2 IMPLANT

## 2012-05-31 NOTE — Anesthesia Preprocedure Evaluation (Signed)
Anesthesia Evaluation  Patient identified by MRN, date of birth, ID band Patient awake    Reviewed: Allergy & Precautions, H&P , NPO status , Patient's Chart, lab work & pertinent test results  Airway Mallampati: II TM Distance: <3 FB Neck ROM: Full    Dental No notable dental hx.    Pulmonary asthma ,  breath sounds clear to auscultation  Pulmonary exam normal       Cardiovascular hypertension, Pt. on medications Rhythm:Regular Rate:Normal     Neuro/Psych Anxiety Myasthenia Gravis    GI/Hepatic negative GI ROS, Neg liver ROS,   Endo/Other  Hypothyroidism   Renal/GU negative Renal ROS  negative genitourinary   Musculoskeletal negative musculoskeletal ROS (+)   Abdominal   Peds negative pediatric ROS (+)  Hematology negative hematology ROS (+)   Anesthesia Other Findings   Reproductive/Obstetrics negative OB ROS                           Anesthesia Physical Anesthesia Plan  ASA: III  Anesthesia Plan: MAC   Post-op Pain Management:    Induction: Intravenous  Airway Management Planned: Simple Face Mask  Additional Equipment:   Intra-op Plan:   Post-operative Plan:   Informed Consent: I have reviewed the patients History and Physical, chart, labs and discussed the procedure including the risks, benefits and alternatives for the proposed anesthesia with the patient or authorized representative who has indicated his/her understanding and acceptance.     Plan Discussed with: CRNA and Surgeon  Anesthesia Plan Comments: (GA as backup)        Anesthesia Quick Evaluation

## 2012-05-31 NOTE — H&P (Signed)
Sabrina [name] is an 71 y.o. female.   Chief Complaint:   Right hand and wrist numbness/tingling and weakness HPI:   71 yo female with moderate to severe carpal tunnel syndrome involving the right upper extremity.  This was confirmed on clinical exam as well as nerve conduction studies.  She wishes to proceed with a right open carpal tunnel release.  The goals are decrease pain, improved sensation, and improved strength.  The risks are nerve and vessel injury.  Past Medical History  Diagnosis Date  . Asthma   . Thyroid disease   . Myasthenia gravis   . Complication of anesthesia     BP FALLS/"ANEPHALACTIC SHOCK"/ EXTREME TREMORS  . Hypertension   . Arthritis   . History of skin cancer   . Hypothyroidism   . Anxiety   . Depression     Past Surgical History  Procedure Date  . Tonsillectomy and adenoidectomy   . Tubal ligation   . Cholecystectomy   . Cerebral aneurysm repair   . Total hip arthroplasty     right  . Joint replacement     RT HIP  . Left shoulder arthroscopy      Family History  Problem Relation Age of Onset  . Hypertension Mother   . Heart disease Mother     heart attack  . Stroke Father   . Heart disease Father     heart attack   Social History:  reports that she has never smoked. She has never used smokeless tobacco. She reports that she drinks alcohol. She reports that she does not use illicit drugs.  Allergies:  Allergies  Allergen Reactions  . Other Anaphylaxis    Sodium pentathol  . Aminoglycosides     Tobramycin, gentamycin, kanamycin, neomycin, streptomycin  . Avelox (Moxifloxacin Hcl In Nacl) Other (See Comments)    Cannot take d/t MG  . Beta Adrenergic Blockers     Proponolol, timolol maleate eyedrops  . Calcium Channel Blockers     Blood pressure medications   . Cephalosporins     No paralytics also!  . Ciprofloxacin   . Clindamycin/Lincomycin     May exascerbate myasthenia gravis  . Colistin   . Imuran (Azathioprine Sodium) Other  (See Comments)    Kidneys shut down  . Iodinated Diagnostic Agents   . Iodine     Iodine contrast   . Macrolides And Ketolides     Erythromocin, azithromycin, telithromycin)  . Magnesium-Containing Compounds     Including milk of magnesia, antacids containing magnesium hydroxide (maalox, mylanta) and epsom salts   . Norfloxacin   . Ofloxacin   . Pefloxacin   . Procainamide   . Quinidine   . Quinine Derivatives   . Succinylcholine   . Tubocurarine   . Vecuronium     Medications Prior to Admission  Medication Sig Dispense Refill  . ALPRAZolam (XANAX) 0.5 MG tablet Take 0.5 mg by mouth every 6 (six) hours.       . Ascorbic Acid (VITAMIN C) 1000 MG tablet Take 1,000 mg by mouth daily.       . Calcium Carbonate (CALTRATE 600 PO) Take 600 mg by mouth 2 (two) times daily.       . Cholecalciferol (VITAMIN D-3 PO) Take 1,000 mg by mouth daily.       Marland Kitchen FLUoxetine (PROZAC) 20 MG tablet Take 20 mg by mouth every morning.       . Fluticasone-Salmeterol (ADVAIR DISKUS) 250-50 MCG/DOSE AEPB Inhale 1 puff  into the lungs every 12 (twelve) hours.       . Glucosamine-Chondroitin (GLUCOSAMINE CHONDR COMPLEX PO) Take 1 tablet by mouth every morning.       . Ibuprofen-Diphenhydramine Cit (ADVIL PM PO) Take 1 tablet by mouth at bedtime.       Marland Kitchen levothyroxine (SYNTHROID, LEVOTHROID) 75 MCG tablet Take 75 mcg by mouth every morning.       Marland Kitchen lisinopril-hydrochlorothiazide (PRINZIDE,ZESTORETIC) 20-12.5 MG per tablet Take 1 tablet by mouth every morning.       . montelukast (SINGULAIR) 10 MG tablet Take 10 mg by mouth at bedtime.       . Multiple Vitamin (MULITIVITAMIN WITH MINERALS) TABS Take 1 tablet by mouth daily.      . mycophenolate (CELLCEPT) 500 MG tablet Take 1,000 mg by mouth 2 (two) times daily.       . Omega-3 Fatty Acids (FISH OIL PO) Take 1,200 mg by mouth daily.       Bertram Gala Glycol-Propyl Glycol (SYSTANE OP) Apply 1 drop to eye every morning.      . traZODone (DESYREL) 150 MG tablet  Take 75 mg by mouth at bedtime.       . valACYclovir (VALTREX) 1000 MG tablet Take 1,000 mg by mouth daily.        Results for orders placed during the hospital encounter of 05/31/12 (from the past 48 hour(s))  CBC     Status: Normal   Collection Time   05/31/12  2:00 PM      Component Value Range Comment   WBC 8.1  4.0 - 10.5 K/uL    RBC 4.80  3.87 - 5.11 MIL/uL    Hemoglobin 14.4  12.0 - 15.0 g/dL    HCT 09.8  11.9 - 14.7 %    MCV 87.3  78.0 - 100.0 fL    MCH 30.0  26.0 - 34.0 pg    MCHC 34.4  30.0 - 36.0 g/dL    RDW 82.9  56.2 - 13.0 %    Platelets 389  150 - 400 K/uL   BASIC METABOLIC PANEL     Status: Abnormal   Collection Time   05/31/12  2:00 PM      Component Value Range Comment   Sodium 137  135 - 145 mEq/L    Potassium 3.6  3.5 - 5.1 mEq/L    Chloride 101  96 - 112 mEq/L    CO2 25  19 - 32 mEq/L    Glucose, Bld 91  70 - 99 mg/dL    BUN 13  6 - 23 mg/dL    Creatinine, Ser 8.65  0.50 - 1.10 mg/dL    Calcium 9.9  8.4 - 78.4 mg/dL    GFR calc non Af Amer 70 (*) >90 mL/min    GFR calc Af Amer 81 (*) >90 mL/min    No results found.  Review of Systems  All other systems reviewed and are negative.    Blood pressure 144/74, pulse 71, temperature 98.4 F (36.9 C), resp. rate 20, height 5\' 6"  (1.676 m), weight 82.611 kg (182 lb 2 oz), SpO2 98.00%. Physical Exam  Constitutional: She is oriented to person, place, and time. She appears well-developed and well-nourished.  HENT:  Head: Normocephalic and atraumatic.  Eyes: EOM are normal. Pupils are equal, round, and reactive to light.  Neck: Normal range of motion. Neck supple.  Cardiovascular: Normal rate and regular rhythm.   Respiratory: Effort normal and breath sounds normal.  GI: Soft. Bowel sounds are normal.  Musculoskeletal:       Right hand: decreased sensation noted. Decreased sensation is present in the medial distribution. Decreased strength noted.  Neurological: She is alert and oriented to person, place, and  time.  Skin: Skin is warm and dry.  Psychiatric: She has a normal mood and affect.     Assessment/Plan Right severe carpal tunnel syndrome 1) to the OR today for a right open carpal tunnel release  Bayard More Y 05/31/2012, 2:56 PM

## 2012-05-31 NOTE — Anesthesia Postprocedure Evaluation (Signed)
  Anesthesia Post-op Note  Patient: Sabrina [name]  Procedure(s) Performed: Procedure(s) (LRB): CARPAL TUNNEL RELEASE (Left)  Patient Location: PACU  Anesthesia Type: MAC  Level of Consciousness: awake and alert   Airway and Oxygen Therapy: Patient Spontanous Breathing  Post-op Pain: mild  Post-op Assessment: Post-op Vital signs reviewed, Patient's Cardiovascular Status Stable, Respiratory Function Stable, Patent Airway and No signs of Nausea or vomiting  Post-op Vital Signs: stable  Complications: No apparent anesthesia complications

## 2012-05-31 NOTE — Progress Notes (Signed)
Pt w/ Myasthenia Gravis ordered Clindamycin pre-op.  This medication is contraindicated with MG.  Received v/o from Erskine Squibb RN (short stay) to d/c per Dr. Magnus Ivan.  Order d/c'd!  Lorenza Evangelist 05/31/2012 7:24 AM

## 2012-05-31 NOTE — Brief Op Note (Signed)
05/31/2012  4:18 PM  PATIENT:  [name]  71 y.o. female  PRE-OPERATIVE DIAGNOSIS:  Left carpal tunnel syndrome  POST-OPERATIVE DIAGNOSIS:  Left carpal tunnel syndrome  PROCEDURE:  Procedure(s) (LRB) with comments: CARPAL TUNNEL RELEASE (Left) - Left Open Carpal Tunnel Release  SURGEON:  Surgeon(s) and Role:    * Kathryne Hitch, MD - Primary  PHYSICIAN ASSISTANT:   ASSISTANTS: none   ANESTHESIA:   local and MAC  EBL:  Total I/O In: 450 [I.V.:450] Out: 2 [Blood:2]  BLOOD ADMINISTERED:none  DRAINS: none   LOCAL MEDICATIONS USED:  MARCAINE    and LIDOCAINE   SPECIMEN:  No Specimen  DISPOSITION OF SPECIMEN:  N/A  COUNTS:  YES  TOURNIQUET:   Total Tourniquet Time Documented: Upper Arm (Right) - 11 minutes  DICTATION: .Other Dictation: Dictation Number [id number]  PLAN OF CARE: Discharge to home after PACU  PATIENT DISPOSITION:  PACU - hemodynamically stable.   Delay start of Pharmacological VTE agent (>24hrs) due to surgical blood loss or risk of bleeding: not applicable

## 2012-06-01 NOTE — Op Note (Signed)
[name], [name]                 ACCOUNT NO.:  [id number]  MEDICAL RECORD NO.:  [id number]  LOCATION:  [facility]                         FACILITY:  [facility]  PHYSICIAN:  Vanita Panda. Magnus Ivan, M.D.DATE OF BIRTH:  02-10-1941  DATE OF PROCEDURE:  05/31/2012 DATE OF DISCHARGE:  05/31/2012                              OPERATIVE REPORT   PREOPERATIVE DIAGNOSIS:  Moderate to severe carpal tunnel syndrome, right upper extremity.  POSTOPERATIVE DIAGNOSIS:  Moderate to severe carpal tunnel syndrome, right upper extremity.  PROCEDURE:  Right open carpal tunnel release.  SURGEON:  Vanita Panda. Magnus Ivan, M.D.  ANESTHESIA: 1. Mask ventilation IV sedation. 2. Local with 1% plain lidocaine followed by 0.25% plain Sensorcaine.  BLOOD LOSS:  Minimal.  TOURNIQUET TIME:  9 minutes.  ANTIBIOTICS:  1 g of Vancomycin.  COMPLICATIONS:  None.  INDICATIONS:  [name] is a 71 year old female with moderate to severe carpal tunnel syndrome confirmed by clinical exam and nerve conduction studies.  Due to her increased pain, numbness, tingling, and weakness, she wished to proceed with open carpal tunnel release.  The risks and benefits were explained to her in detail and she did wish to proceed with surgery.  PROCEDURE DESCRIPTION:  After informed consent was obtained, appropriate right hand was marked.  She was brought to the operating room, placed supine on the operating table.  Her right arm was on arm table.  A nonsterile tourniquet was placed in her upper right arm and her right arm was prepped and draped with DuraPrep and sterile drapes.  Time-out was called.  She was identified as correct patient, correct right arm. Mask ventilation IV sedation was obtained and then I used 1% plain lidocaine in the palm of the hand followed by 0.25% plain Sensorcaine. When an adequate anesthesia was obtained, I made an incision in the palm.  After wrapping the arm out with Esmarch, inflated tourniquet  to 250 mm pressure.  I then made an incision in the palm and dissected down to the distal edge of the transverse carpal ligament.  I was able to protect the median nerve and then divided the transverse carpal ligament in its entirety.  I probed the median nerve and it was intact.  I then cleaned the soft tissues with normal saline solution.  I closed the skin with interrupted 3-0 nylon suture.  Xeroform followed by a sterile dressing was applied.  The tourniquet was let down.  Fingers pink and nice.  Good hemostasis was evident.  She was taken to the recovery room, awake and alert.     Vanita Panda. Magnus Ivan, M.D.     CYB/MEDQ  D:  05/31/2012  T:  06/01/2012  Job:  [id number]

## 2012-06-03 ENCOUNTER — Encounter (HOSPITAL_COMMUNITY): Payer: Self-pay | Admitting: Orthopaedic Surgery

## 2012-06-28 DIAGNOSIS — R209 Unspecified disturbances of skin sensation: Secondary | ICD-10-CM | POA: Diagnosis not present

## 2012-07-22 DIAGNOSIS — M25519 Pain in unspecified shoulder: Secondary | ICD-10-CM | POA: Diagnosis not present

## 2012-07-22 DIAGNOSIS — M67919 Unspecified disorder of synovium and tendon, unspecified shoulder: Secondary | ICD-10-CM | POA: Diagnosis not present

## 2012-07-22 DIAGNOSIS — D239 Other benign neoplasm of skin, unspecified: Secondary | ICD-10-CM | POA: Diagnosis not present

## 2012-07-22 DIAGNOSIS — Z85828 Personal history of other malignant neoplasm of skin: Secondary | ICD-10-CM | POA: Diagnosis not present

## 2012-07-22 DIAGNOSIS — L538 Other specified erythematous conditions: Secondary | ICD-10-CM | POA: Diagnosis not present

## 2012-07-22 DIAGNOSIS — L819 Disorder of pigmentation, unspecified: Secondary | ICD-10-CM | POA: Diagnosis not present

## 2012-07-22 DIAGNOSIS — M719 Bursopathy, unspecified: Secondary | ICD-10-CM | POA: Diagnosis not present

## 2012-07-22 DIAGNOSIS — D1801 Hemangioma of skin and subcutaneous tissue: Secondary | ICD-10-CM | POA: Diagnosis not present

## 2012-07-22 DIAGNOSIS — L821 Other seborrheic keratosis: Secondary | ICD-10-CM | POA: Diagnosis not present

## 2012-07-22 DIAGNOSIS — L708 Other acne: Secondary | ICD-10-CM | POA: Diagnosis not present

## 2012-07-26 DIAGNOSIS — M19019 Primary osteoarthritis, unspecified shoulder: Secondary | ICD-10-CM | POA: Diagnosis not present

## 2012-08-21 DIAGNOSIS — S8000XA Contusion of unspecified knee, initial encounter: Secondary | ICD-10-CM | POA: Diagnosis not present

## 2012-08-21 DIAGNOSIS — M25519 Pain in unspecified shoulder: Secondary | ICD-10-CM | POA: Diagnosis not present

## 2012-08-29 DIAGNOSIS — Z23 Encounter for immunization: Secondary | ICD-10-CM | POA: Diagnosis not present

## 2012-09-10 ENCOUNTER — Other Ambulatory Visit: Payer: Self-pay | Admitting: Orthopedic Surgery

## 2012-09-10 DIAGNOSIS — E039 Hypothyroidism, unspecified: Secondary | ICD-10-CM | POA: Diagnosis not present

## 2012-09-10 DIAGNOSIS — E782 Mixed hyperlipidemia: Secondary | ICD-10-CM | POA: Diagnosis not present

## 2012-09-10 DIAGNOSIS — I10 Essential (primary) hypertension: Secondary | ICD-10-CM | POA: Diagnosis not present

## 2012-09-10 DIAGNOSIS — M810 Age-related osteoporosis without current pathological fracture: Secondary | ICD-10-CM | POA: Diagnosis not present

## 2012-09-16 ENCOUNTER — Other Ambulatory Visit: Payer: Self-pay | Admitting: Internal Medicine

## 2012-09-16 DIAGNOSIS — Z1231 Encounter for screening mammogram for malignant neoplasm of breast: Secondary | ICD-10-CM

## 2012-09-18 DIAGNOSIS — Z0000 Encounter for general adult medical examination without abnormal findings: Secondary | ICD-10-CM | POA: Diagnosis not present

## 2012-09-18 DIAGNOSIS — E782 Mixed hyperlipidemia: Secondary | ICD-10-CM | POA: Diagnosis not present

## 2012-09-18 DIAGNOSIS — I10 Essential (primary) hypertension: Secondary | ICD-10-CM | POA: Diagnosis not present

## 2012-09-18 DIAGNOSIS — Z1331 Encounter for screening for depression: Secondary | ICD-10-CM | POA: Diagnosis not present

## 2012-09-23 DIAGNOSIS — Z1212 Encounter for screening for malignant neoplasm of rectum: Secondary | ICD-10-CM | POA: Diagnosis not present

## 2012-09-24 DIAGNOSIS — D649 Anemia, unspecified: Secondary | ICD-10-CM | POA: Diagnosis not present

## 2012-09-24 DIAGNOSIS — D7589 Other specified diseases of blood and blood-forming organs: Secondary | ICD-10-CM | POA: Diagnosis not present

## 2012-09-24 DIAGNOSIS — E538 Deficiency of other specified B group vitamins: Secondary | ICD-10-CM | POA: Diagnosis not present

## 2012-09-24 DIAGNOSIS — G7000 Myasthenia gravis without (acute) exacerbation: Secondary | ICD-10-CM | POA: Diagnosis not present

## 2012-09-30 ENCOUNTER — Encounter (HOSPITAL_COMMUNITY): Payer: Self-pay | Admitting: Pharmacy Technician

## 2012-10-02 ENCOUNTER — Inpatient Hospital Stay (HOSPITAL_COMMUNITY): Admission: RE | Admit: 2012-10-02 | Payer: Medicare Other | Source: Ambulatory Visit

## 2012-10-02 ENCOUNTER — Other Ambulatory Visit (HOSPITAL_COMMUNITY): Payer: Self-pay | Admitting: Orthopedic Surgery

## 2012-10-02 ENCOUNTER — Encounter (HOSPITAL_COMMUNITY): Payer: Self-pay

## 2012-10-02 NOTE — Pre-Procedure Instructions (Signed)
PT UNABLE TO COME TODAY FOR PREOP VISIT DUE TO WEATHER.  PT'S MEDICAL HX REVIEWED WITH HER BY PHONE IN EPIC. HER EKG REPORT IS ON CHART FROM DR. WILLIAM SHAW  ALONG WITH HER LAST OFFICE NOTE 09/30/12. HER LAST CXR WAS 12/21/11 -REPORT IN EPIC AND OK FOR THIS SURGERY. PT HAS MYASTHENIA GRAVIS--CLEARANCE FOR HER SHOULDER SURGERY ON THIS CHART FROM HER NEUROLOGIST - DR. LISA HOBSON-WEBB WITH [facility] - AND OFFICE NOTE 09/24/12. PT WILL COME FOR PREOP VISIT Friday 1/31 TO FINISH ANY CONSENTS FOR SURGERY, PREOP INSTRUCTIONS AND LAB TESTS.

## 2012-10-02 NOTE — Patient Instructions (Addendum)
YOUR SURGERY IS SCHEDULED AT [facility]  ON:   Thursday  2/6  REPORT TO [facility] AT:  0730 AM      PHONE # FOR SHORT STAY IS [phone]  DO NOT EAT OR DRINK ANYTHING AFTER MIDNIGHT THE NIGHT BEFORE YOUR SURGERY.  YOU MAY BRUSH YOUR TEETH, RINSE OUT YOUR MOUTH--BUT NO WATER, NO FOOD, NO CHEWING GUM, NO MINTS, NO CANDIES, NO CHEWING TOBACCO.   PLEASE TAKE THE FOLLOWING MEDICATIONS THE AM OF YOUR SURGERY WITH A FEW SIPS OF WATER:  ALPRAZOLAM, FLUOXETINE, CELLCEPT.  USE YOUR ADVAIR DISCUS INHALER AND YOUR ALBUTEROL INHALER--TAKE YOUR ALBUTEROL INHALER TO SURGERY.  USE YOUR SYSTANE EYEDROPS.    BRING YOUR CELLCEPT, SINGULAIR, SYNTHROID, SYSTANE EYE DROPS, VALTREX AND THE OINTMENT FOR YOUR FUNGAL SKIN RASH--ASK THE NURSE TAKING CARE OF YOU AFTER SURGERY --IF PHARMACY ABLE TO PROVIDE BRAND NAMES - OR ARE YOU TO TAKE YOUR OWN MEDICATIONS BROUGHT FROM HOME.    DO NOT BRING VALUABLES, MONEY, CREDIT CARDS.  DO NOT WEAR JEWELRY, MAKE-UP, NAIL POLISH AND NO METAL PINS OR CLIPS IN YOUR HAIR. CONTACT LENS, DENTURES / PARTIALS, GLASSES SHOULD NOT BE WORN TO SURGERY AND IN MOST CASES-HEARING AIDS WILL NEED TO BE REMOVED.  BRING YOUR GLASSES CASE, ANY EQUIPMENT NEEDED FOR YOUR CONTACT LENS. FOR PATIENTS ADMITTED TO THE HOSPITAL--CHECK OUT TIME THE DAY OF DISCHARGE IS 11:00 AM.  ALL INPATIENT ROOMS ARE PRIVATE - WITH BATHROOM, TELEPHONE, TELEVISION AND WIFI INTERNET.                               PLEASE READ OVER ANY  FACT SHEETS THAT YOU WERE GIVEN: MRSA INFORMATION, BLOOD TRANSFUSION INFORMATION, INCENTIVE SPIROMETER INFORMATION. FAILURE TO FOLLOW THESE INSTRUCTIONS MAY RESULT IN THE CANCELLATION OF YOUR SURGERY.   PATIENT SIGNATURE_________________________________

## 2012-10-04 ENCOUNTER — Encounter (HOSPITAL_COMMUNITY): Payer: Self-pay

## 2012-10-04 ENCOUNTER — Encounter (HOSPITAL_COMMUNITY)
Admission: RE | Admit: 2012-10-04 | Discharge: 2012-10-04 | Disposition: A | Discharge status: Home / Self Care | Payer: Medicare Other | Source: Ambulatory Visit | Attending: Orthopedic Surgery | Admitting: Orthopedic Surgery

## 2012-10-04 ENCOUNTER — Other Ambulatory Visit: Payer: Self-pay

## 2012-10-04 DIAGNOSIS — J45909 Unspecified asthma, uncomplicated: Secondary | ICD-10-CM | POA: Diagnosis not present

## 2012-10-04 DIAGNOSIS — E039 Hypothyroidism, unspecified: Secondary | ICD-10-CM | POA: Diagnosis not present

## 2012-10-04 DIAGNOSIS — M19019 Primary osteoarthritis, unspecified shoulder: Secondary | ICD-10-CM | POA: Diagnosis not present

## 2012-10-04 DIAGNOSIS — G5600 Carpal tunnel syndrome, unspecified upper limb: Secondary | ICD-10-CM | POA: Diagnosis not present

## 2012-10-04 HISTORY — DX: Hyperlipidemia, unspecified: E78.5

## 2012-10-04 HISTORY — DX: Rash and other nonspecific skin eruption: R21

## 2012-10-04 HISTORY — DX: Carpal tunnel syndrome, unspecified upper limb: G56.00

## 2012-10-04 HISTORY — DX: Age-related osteoporosis without current pathological fracture: M81.0

## 2012-10-04 HISTORY — DX: Malignant (primary) neoplasm, unspecified: C80.1

## 2012-10-04 HISTORY — DX: Pain, unspecified: R52

## 2012-10-04 LAB — URINALYSIS, ROUTINE W REFLEX MICROSCOPIC
Bilirubin Urine: NEGATIVE
Nitrite: NEGATIVE
Specific Gravity, Urine: 1.009 (ref 1.005–1.030)
Urobilinogen, UA: 0.2 mg/dL (ref 0.0–1.0)
pH: 6 (ref 5.0–8.0)

## 2012-10-04 LAB — CBC WITH DIFFERENTIAL/PLATELET
Basophils Absolute: 0 10*3/uL (ref 0.0–0.1)
Basophils Relative: 0 % (ref 0–1)
Eosinophils Absolute: 0.1 10*3/uL (ref 0.0–0.7)
HCT: 41.9 % (ref 36.0–46.0)
MCH: 32.6 pg (ref 26.0–34.0)
MCHC: 34.4 g/dL (ref 30.0–36.0)
Monocytes Absolute: 0.5 10*3/uL (ref 0.1–1.0)
Neutro Abs: 5.2 10*3/uL (ref 1.7–7.7)
Neutrophils Relative %: 66 % (ref 43–77)
RDW: 13.4 % (ref 11.5–15.5)

## 2012-10-04 LAB — COMPREHENSIVE METABOLIC PANEL
AST: 22 U/L (ref 0–37)
Albumin: 3.9 g/dL (ref 3.5–5.2)
Chloride: 97 mEq/L (ref 96–112)
Creatinine, Ser: 0.8 mg/dL (ref 0.50–1.10)
Total Bilirubin: 0.6 mg/dL (ref 0.3–1.2)
Total Protein: 6.9 g/dL (ref 6.0–8.3)

## 2012-10-04 LAB — PROTIME-INR
INR: 0.96 (ref 0.00–1.49)
Prothrombin Time: 12.7 seconds (ref 11.6–15.2)

## 2012-10-04 LAB — APTT: aPTT: 35 seconds (ref 24–37)

## 2012-10-04 LAB — URINE MICROSCOPIC-ADD ON

## 2012-10-04 NOTE — Progress Notes (Signed)
09/24/12- Clearance from Dr Misty Stanley Hobson-Webb on chart Anesthesia Record from 10/19/09  EKG 09/18/12 on chart  09/28/12 [facility] exam ECHO 05/02/2007 on chart  Operative Report 10/18/2005 from [facility] - Related to Brain Aneurysm.

## 2012-10-04 NOTE — Progress Notes (Signed)
Urinalysis faxed via EPIC to Dr Ave Filter.

## 2012-10-04 NOTE — Progress Notes (Signed)
Dr Okey Dupre notified at end of preop appointment that patient was here and has myasthenia gravis.  I told him that patient had clearance note on chart from [facility] - Dr Misty Stanley Hobson-Webb dated 09/24/12.  Also on chart 10/09/09 anesthesia records .  Vital signs from today preop appointment were noted to Dr Okey Dupre along with noted patient's heart rate at time of preop appointment to be between 60-100 .  EKG was done which showed Sinus Bradycardia with 1st Degree AV Block unconfirmed.  I informed Dr Okey Dupre I noted fine tremors in fingers while vital signs being completed.  Dr Okey Dupre stated they would see patient morning of surgery.

## 2012-10-07 ENCOUNTER — Ambulatory Visit: Payer: Medicare Other

## 2012-10-10 ENCOUNTER — Encounter (HOSPITAL_COMMUNITY): Payer: Self-pay | Admitting: *Deleted

## 2012-10-10 ENCOUNTER — Encounter (HOSPITAL_COMMUNITY): Payer: Self-pay | Admitting: Anesthesiology

## 2012-10-10 ENCOUNTER — Inpatient Hospital Stay (HOSPITAL_COMMUNITY): Payer: Medicare Other

## 2012-10-10 ENCOUNTER — Encounter (HOSPITAL_COMMUNITY)
Admission: RE | Disposition: A | Discharge status: Skilled Nursing Facility | Payer: Self-pay | Source: Ambulatory Visit | Attending: Orthopedic Surgery

## 2012-10-10 ENCOUNTER — Inpatient Hospital Stay (HOSPITAL_COMMUNITY): Payer: Medicare Other | Admitting: Anesthesiology

## 2012-10-10 ENCOUNTER — Inpatient Hospital Stay (HOSPITAL_COMMUNITY)
Admission: RE | Admit: 2012-10-10 | Discharge: 2012-10-14 | DRG: 484 | Disposition: A | Discharge status: Skilled Nursing Facility | Payer: Medicare Other | Source: Ambulatory Visit | Attending: Orthopedic Surgery | Admitting: Orthopedic Surgery

## 2012-10-10 DIAGNOSIS — F329 Major depressive disorder, single episode, unspecified: Secondary | ICD-10-CM | POA: Diagnosis present

## 2012-10-10 DIAGNOSIS — Z96619 Presence of unspecified artificial shoulder joint: Secondary | ICD-10-CM | POA: Diagnosis not present

## 2012-10-10 DIAGNOSIS — G5600 Carpal tunnel syndrome, unspecified upper limb: Secondary | ICD-10-CM | POA: Diagnosis not present

## 2012-10-10 DIAGNOSIS — S4980XA Other specified injuries of shoulder and upper arm, unspecified arm, initial encounter: Secondary | ICD-10-CM | POA: Diagnosis not present

## 2012-10-10 DIAGNOSIS — Z4789 Encounter for other orthopedic aftercare: Secondary | ICD-10-CM | POA: Diagnosis not present

## 2012-10-10 DIAGNOSIS — I10 Essential (primary) hypertension: Secondary | ICD-10-CM | POA: Diagnosis present

## 2012-10-10 DIAGNOSIS — E785 Hyperlipidemia, unspecified: Secondary | ICD-10-CM | POA: Diagnosis present

## 2012-10-10 DIAGNOSIS — M25519 Pain in unspecified shoulder: Secondary | ICD-10-CM | POA: Diagnosis not present

## 2012-10-10 DIAGNOSIS — J45909 Unspecified asthma, uncomplicated: Secondary | ICD-10-CM | POA: Diagnosis not present

## 2012-10-10 DIAGNOSIS — M6281 Muscle weakness (generalized): Secondary | ICD-10-CM | POA: Diagnosis not present

## 2012-10-10 DIAGNOSIS — G7000 Myasthenia gravis without (acute) exacerbation: Secondary | ICD-10-CM | POA: Diagnosis present

## 2012-10-10 DIAGNOSIS — F411 Generalized anxiety disorder: Secondary | ICD-10-CM | POA: Diagnosis not present

## 2012-10-10 DIAGNOSIS — M19019 Primary osteoarthritis, unspecified shoulder: Secondary | ICD-10-CM | POA: Diagnosis not present

## 2012-10-10 DIAGNOSIS — M810 Age-related osteoporosis without current pathological fracture: Secondary | ICD-10-CM | POA: Diagnosis not present

## 2012-10-10 DIAGNOSIS — Z79899 Other long term (current) drug therapy: Secondary | ICD-10-CM

## 2012-10-10 DIAGNOSIS — R279 Unspecified lack of coordination: Secondary | ICD-10-CM | POA: Diagnosis not present

## 2012-10-10 DIAGNOSIS — Z96649 Presence of unspecified artificial hip joint: Secondary | ICD-10-CM

## 2012-10-10 DIAGNOSIS — Z471 Aftercare following joint replacement surgery: Secondary | ICD-10-CM | POA: Diagnosis not present

## 2012-10-10 DIAGNOSIS — M67919 Unspecified disorder of synovium and tendon, unspecified shoulder: Secondary | ICD-10-CM | POA: Diagnosis not present

## 2012-10-10 DIAGNOSIS — M12819 Other specific arthropathies, not elsewhere classified, unspecified shoulder: Secondary | ICD-10-CM

## 2012-10-10 DIAGNOSIS — F3289 Other specified depressive episodes: Secondary | ICD-10-CM | POA: Diagnosis present

## 2012-10-10 DIAGNOSIS — R269 Unspecified abnormalities of gait and mobility: Secondary | ICD-10-CM | POA: Diagnosis not present

## 2012-10-10 DIAGNOSIS — E039 Hypothyroidism, unspecified: Secondary | ICD-10-CM | POA: Diagnosis present

## 2012-10-10 DIAGNOSIS — M1990 Unspecified osteoarthritis, unspecified site: Secondary | ICD-10-CM | POA: Diagnosis not present

## 2012-10-10 DIAGNOSIS — Z96612 Presence of left artificial shoulder joint: Secondary | ICD-10-CM

## 2012-10-10 HISTORY — PX: REVERSE SHOULDER ARTHROPLASTY: SHX5054

## 2012-10-10 HISTORY — PX: CARPAL TUNNEL RELEASE: SHX101

## 2012-10-10 LAB — TYPE AND SCREEN
ABO/RH(D): A POS
Antibody Screen: NEGATIVE

## 2012-10-10 SURGERY — ARTHROPLASTY, SHOULDER, TOTAL, REVERSE
Anesthesia: General | Site: Wrist | Laterality: Left | Wound class: Clean

## 2012-10-10 MED ORDER — METOCLOPRAMIDE HCL 10 MG PO TABS: 5.0000 mg | ORAL_TABLET | Freq: Three times a day (TID) | ORAL | Status: DC | PRN

## 2012-10-10 MED ORDER — EPHEDRINE SULFATE 50 MG/ML IJ SOLN
INTRAMUSCULAR | Status: DC | PRN
  Administered 2012-10-10: 5 mg via INTRAVENOUS
  Administered 2012-10-10: 7.5 mg via INTRAVENOUS
  Administered 2012-10-10: 10 mg via INTRAVENOUS
  Administered 2012-10-10: 5 mg via INTRAVENOUS
  Administered 2012-10-10: 7.5 mg via INTRAVENOUS

## 2012-10-10 MED ORDER — ALBUTEROL SULFATE HFA 108 (90 BASE) MCG/ACT IN AERS
2.0000 | INHALATION_SPRAY | Freq: Four times a day (QID) | RESPIRATORY_TRACT | Status: DC | PRN
  Filled 2012-10-10: qty 6.7

## 2012-10-10 MED ORDER — ONDANSETRON HCL 4 MG/2ML IJ SOLN: 4.0000 mg | Freq: Four times a day (QID) | INTRAMUSCULAR | Status: DC | PRN

## 2012-10-10 MED ORDER — FENTANYL CITRATE 0.05 MG/ML IJ SOLN
25.0000 ug | INTRAMUSCULAR | Status: DC | PRN
  Administered 2012-10-10: 50 ug via INTRAVENOUS
  Administered 2012-10-10 (×2): 25 ug via INTRAVENOUS

## 2012-10-10 MED ORDER — FLUOXETINE HCL 20 MG PO TABS
20.0000 mg | ORAL_TABLET | Freq: Every morning | ORAL | Status: DC
  Administered 2012-10-11 – 2012-10-14 (×4): 20 mg via ORAL
  Filled 2012-10-10 (×4): qty 1

## 2012-10-10 MED ORDER — ZOLPIDEM TARTRATE 5 MG PO TABS: 5.0000 mg | ORAL_TABLET | Freq: Every evening | ORAL | Status: DC | PRN

## 2012-10-10 MED ORDER — ACETAMINOPHEN 650 MG RE SUPP: 650.0000 mg | Freq: Four times a day (QID) | RECTAL | Status: DC | PRN

## 2012-10-10 MED ORDER — VANCOMYCIN HCL IN DEXTROSE 1-5 GM/200ML-% IV SOLN
1000.0000 mg | Freq: Two times a day (BID) | INTRAVENOUS | Status: AC
  Administered 2012-10-10 – 2012-10-11 (×2): 1000 mg via INTRAVENOUS
  Filled 2012-10-10 (×2): qty 200

## 2012-10-10 MED ORDER — LEVOTHYROXINE SODIUM 75 MCG PO TABS
75.0000 ug | ORAL_TABLET | Freq: Every morning | ORAL | Status: DC
  Administered 2012-10-11 – 2012-10-14 (×4): 75 ug via ORAL
  Filled 2012-10-10: qty 1

## 2012-10-10 MED ORDER — FLEET ENEMA 7-19 GM/118ML RE ENEM: 1.0000 | ENEMA | Freq: Once | RECTAL | Status: AC | PRN

## 2012-10-10 MED ORDER — CHLORHEXIDINE GLUCONATE 4 % EX LIQD
60.0000 mL | Freq: Once | CUTANEOUS | Status: DC
  Filled 2012-10-10: qty 60

## 2012-10-10 MED ORDER — MYCOPHENOLATE MOFETIL 500 MG PO TABS
1000.0000 mg | ORAL_TABLET | Freq: Two times a day (BID) | ORAL | Status: DC
  Administered 2012-10-10 – 2012-10-14 (×8): 1000 mg via ORAL
  Filled 2012-10-10 (×2): qty 2

## 2012-10-10 MED ORDER — MORPHINE SULFATE 2 MG/ML IJ SOLN: 1.0000 mg | INTRAMUSCULAR | Status: DC | PRN

## 2012-10-10 MED ORDER — LACTATED RINGERS IV SOLN
INTRAVENOUS | Status: DC | PRN
  Administered 2012-10-10 (×3): via INTRAVENOUS

## 2012-10-10 MED ORDER — ONDANSETRON HCL 4 MG/2ML IJ SOLN
INTRAMUSCULAR | Status: DC | PRN
  Administered 2012-10-10: 4 mg via INTRAVENOUS

## 2012-10-10 MED ORDER — OXYCODONE-ACETAMINOPHEN 5-325 MG PO TABS
1.0000 | ORAL_TABLET | ORAL | Status: DC | PRN
  Administered 2012-10-10 – 2012-10-11 (×6): 2 via ORAL
  Administered 2012-10-12: 1 via ORAL
  Filled 2012-10-10 (×6): qty 2
  Filled 2012-10-10: qty 1

## 2012-10-10 MED ORDER — METOCLOPRAMIDE HCL 5 MG/ML IJ SOLN: 5.0000 mg | Freq: Three times a day (TID) | INTRAMUSCULAR | Status: DC | PRN

## 2012-10-10 MED ORDER — ALPRAZOLAM 0.5 MG PO TABS
0.5000 mg | ORAL_TABLET | Freq: Four times a day (QID) | ORAL | Status: DC
  Administered 2012-10-10 – 2012-10-14 (×17): 0.5 mg via ORAL
  Filled 2012-10-10 (×18): qty 1

## 2012-10-10 MED ORDER — TRAZODONE HCL 50 MG PO TABS
75.0000 mg | ORAL_TABLET | Freq: Every day | ORAL | Status: DC
  Administered 2012-10-10 – 2012-10-13 (×4): 75 mg via ORAL
  Filled 2012-10-10 (×5): qty 1

## 2012-10-10 MED ORDER — SODIUM CHLORIDE 0.9 % IV SOLN
INTRAVENOUS | Status: DC
  Administered 2012-10-10: 1000 mL via INTRAVENOUS

## 2012-10-10 MED ORDER — HYDROCHLOROTHIAZIDE 12.5 MG PO CAPS
12.5000 mg | ORAL_CAPSULE | Freq: Every day | ORAL | Status: DC
  Administered 2012-10-11 – 2012-10-14 (×4): 12.5 mg via ORAL
  Filled 2012-10-10 (×4): qty 1

## 2012-10-10 MED ORDER — ASPIRIN EC 325 MG PO TBEC
325.0000 mg | DELAYED_RELEASE_TABLET | Freq: Two times a day (BID) | ORAL | Status: DC
  Administered 2012-10-10 – 2012-10-14 (×8): 325 mg via ORAL
  Filled 2012-10-10 (×10): qty 1

## 2012-10-10 MED ORDER — MONTELUKAST SODIUM 10 MG PO TABS
10.0000 mg | ORAL_TABLET | Freq: Every day | ORAL | Status: DC
  Administered 2012-10-10 – 2012-10-13 (×4): 10 mg via ORAL
  Filled 2012-10-10 (×5): qty 1

## 2012-10-10 MED ORDER — MENTHOL 3 MG MT LOZG: 1.0000 | LOZENGE | OROMUCOSAL | Status: DC | PRN

## 2012-10-10 MED ORDER — VANCOMYCIN HCL IN DEXTROSE 1-5 GM/200ML-% IV SOLN
1000.0000 mg | Freq: Two times a day (BID) | INTRAVENOUS | Status: DC
  Filled 2012-10-10: qty 200

## 2012-10-10 MED ORDER — MOMETASONE FURO-FORMOTEROL FUM 100-5 MCG/ACT IN AERO
2.0000 | INHALATION_SPRAY | Freq: Two times a day (BID) | RESPIRATORY_TRACT | Status: DC
  Administered 2012-10-10 – 2012-10-11 (×3): 2 via RESPIRATORY_TRACT
  Filled 2012-10-10: qty 8.8

## 2012-10-10 MED ORDER — METOCLOPRAMIDE HCL 5 MG/ML IJ SOLN: 10.0000 mg | Freq: Once | INTRAMUSCULAR | Status: DC | PRN

## 2012-10-10 MED ORDER — BISACODYL 10 MG RE SUPP: 10.0000 mg | Freq: Every day | RECTAL | Status: DC | PRN

## 2012-10-10 MED ORDER — DIPHENHYDRAMINE HCL 12.5 MG/5ML PO ELIX: 12.5000 mg | ORAL_SOLUTION | ORAL | Status: DC | PRN

## 2012-10-10 MED ORDER — LACTATED RINGERS IV SOLN: INTRAVENOUS | Status: DC

## 2012-10-10 MED ORDER — VALACYCLOVIR HCL 500 MG PO TABS
1000.0000 mg | ORAL_TABLET | Freq: Every day | ORAL | Status: DC
  Administered 2012-10-10 – 2012-10-14 (×5): 1000 mg via ORAL
  Filled 2012-10-10 (×5): qty 2

## 2012-10-10 MED ORDER — ACETAMINOPHEN 325 MG PO TABS: 650.0000 mg | ORAL_TABLET | Freq: Four times a day (QID) | ORAL | Status: DC | PRN

## 2012-10-10 MED ORDER — PHENOL 1.4 % MT LIQD: 1.0000 | OROMUCOSAL | Status: DC | PRN

## 2012-10-10 MED ORDER — LISINOPRIL-HYDROCHLOROTHIAZIDE 20-12.5 MG PO TABS: 1.0000 | ORAL_TABLET | Freq: Every morning | ORAL | Status: DC

## 2012-10-10 MED ORDER — FENTANYL CITRATE 0.05 MG/ML IJ SOLN
INTRAMUSCULAR | Status: DC | PRN
  Administered 2012-10-10: 100 ug via INTRAVENOUS
  Administered 2012-10-10: 75 ug via INTRAVENOUS
  Administered 2012-10-10: 25 ug via INTRAVENOUS

## 2012-10-10 MED ORDER — ONDANSETRON HCL 4 MG PO TABS
4.0000 mg | ORAL_TABLET | Freq: Four times a day (QID) | ORAL | Status: DC | PRN
  Administered 2012-10-10 (×2): 4 mg via ORAL
  Filled 2012-10-10 (×2): qty 1

## 2012-10-10 MED ORDER — LISINOPRIL 20 MG PO TABS
20.0000 mg | ORAL_TABLET | Freq: Every day | ORAL | Status: DC
  Administered 2012-10-11 – 2012-10-14 (×4): 20 mg via ORAL
  Filled 2012-10-10 (×4): qty 1

## 2012-10-10 MED ORDER — VANCOMYCIN HCL IN DEXTROSE 1-5 GM/200ML-% IV SOLN
1000.0000 mg | INTRAVENOUS | Status: AC
  Administered 2012-10-10: 1000 mg via INTRAVENOUS

## 2012-10-10 MED ORDER — POLYETHYLENE GLYCOL 3350 17 G PO PACK
17.0000 g | PACK | Freq: Every day | ORAL | Status: DC | PRN
  Administered 2012-10-14: 17 g via ORAL

## 2012-10-10 MED ORDER — HYDROMORPHONE HCL PF 1 MG/ML IJ SOLN
0.2500 mg | INTRAMUSCULAR | Status: DC | PRN
  Administered 2012-10-10 (×2): 0.5 mg via INTRAVENOUS

## 2012-10-10 MED ORDER — PROPOFOL 10 MG/ML IV BOLUS
INTRAVENOUS | Status: DC | PRN
  Administered 2012-10-10: 140 mg via INTRAVENOUS

## 2012-10-10 SURGICAL SUPPLY — 70 items
BAG SPEC THK2 15X12 ZIP CLS (MISCELLANEOUS) ×2
BAG ZIPLOCK 12X15 (MISCELLANEOUS) ×3 IMPLANT
BANDAGE CONFORM 3  STR LF (GAUZE/BANDAGES/DRESSINGS) ×1 IMPLANT
BIT DRILL 2.4X128 (BIT) ×1 IMPLANT
BLADE SAW SAG 73X25 THK (BLADE)
BLADE SAW SGTL 73X25 THK (BLADE) IMPLANT
BLADE SURG 15 STRL LF DISP TIS (BLADE) IMPLANT
BLADE SURG 15 STRL SS (BLADE) ×3
BNDG CMPR 9X4 STRL LF SNTH (GAUZE/BANDAGES/DRESSINGS) ×2
BNDG COHESIVE 3X5 TAN STRL LF (GAUZE/BANDAGES/DRESSINGS) ×1 IMPLANT
BNDG ESMARK 4X9 LF (GAUZE/BANDAGES/DRESSINGS) ×1 IMPLANT
CHLORAPREP W/TINT 26ML (MISCELLANEOUS) ×4 IMPLANT
CLEANER TIP ELECTROSURG 2X2 (MISCELLANEOUS) ×3 IMPLANT
CLOTH BEACON ORANGE TIMEOUT ST (SAFETY) ×3 IMPLANT
CLSR STERI-STRIP ANTIMIC 1/2X4 (GAUZE/BANDAGES/DRESSINGS) ×1 IMPLANT
CORDS BIPOLAR (ELECTRODE) ×1 IMPLANT
CUFF TOURN SGL QUICK 18 (TOURNIQUET CUFF) ×1 IMPLANT
DRAPE ORTHO SPLIT 77X108 STRL (DRAPES) ×3
DRAPE POUCH INSTRU U-SHP 10X18 (DRAPES) ×3 IMPLANT
DRAPE SURG 17X11 SM STRL (DRAPES) ×3 IMPLANT
DRAPE SURG ORHT 6 SPLT 77X108 (DRAPES) ×2 IMPLANT
DRAPE TABLE BACK 44X90 PK DISP (DRAPES) ×3 IMPLANT
DRAPE U-SHAPE 47X51 STRL (DRAPES) ×3 IMPLANT
DRSG ADAPTIC 3X8 NADH LF (GAUZE/BANDAGES/DRESSINGS) ×3 IMPLANT
DRSG EMULSION OIL 3X3 NADH (GAUZE/BANDAGES/DRESSINGS) ×1 IMPLANT
DRSG MEPILEX BORDER 4X12 (GAUZE/BANDAGES/DRESSINGS) ×3 IMPLANT
DRSG MEPILEX BORDER 4X4 (GAUZE/BANDAGES/DRESSINGS) ×1 IMPLANT
DRSG MEPILEX BORDER 4X8 (GAUZE/BANDAGES/DRESSINGS) ×2 IMPLANT
ELECT BLADE 6.5 EXT (BLADE) ×3 IMPLANT
ELECT BLADE TIP CTD 4 INCH (ELECTRODE) ×1 IMPLANT
ELECT REM PT RETURN 9FT ADLT (ELECTROSURGICAL) ×3
ELECTRODE REM PT RTRN 9FT ADLT (ELECTROSURGICAL) ×2 IMPLANT
EVACUATOR 1/8 PVC DRAIN (DRAIN) ×3 IMPLANT
FACESHIELD LNG OPTICON STERILE (SAFETY) ×3 IMPLANT
GAUZE SPONGE 4X4 16PLY XRAY LF (GAUZE/BANDAGES/DRESSINGS) ×1 IMPLANT
GLOVE BIO SURGEON STRL SZ7.5 (GLOVE) ×3 IMPLANT
GLOVE BIOGEL PI IND STRL 7.0 (GLOVE) IMPLANT
GLOVE BIOGEL PI IND STRL 8 (GLOVE) ×2 IMPLANT
GLOVE BIOGEL PI INDICATOR 7.0 (GLOVE) ×1
GLOVE BIOGEL PI INDICATOR 8 (GLOVE) ×1
GLOVE SURG ORTHO 7.0 STRL STRW (GLOVE) ×1 IMPLANT
GOWN STRL NON-REIN LRG LVL3 (GOWN DISPOSABLE) ×3 IMPLANT
GOWN STRL REIN XL XLG (GOWN DISPOSABLE) ×4 IMPLANT
HANDPIECE INTERPULSE COAX TIP (DISPOSABLE) ×3
HOOD PEEL AWAY FACE SHEILD DIS (HOOD) ×6 IMPLANT
KIT BASIN OR (CUSTOM PROCEDURE TRAY) ×3 IMPLANT
MANIFOLD NEPTUNE II (INSTRUMENTS) ×3 IMPLANT
NDL MA TROC 1/2 (NEEDLE) IMPLANT
NEEDLE MA TROC 1/2 (NEEDLE) IMPLANT
NS IRRIG 1000ML POUR BTL (IV SOLUTION) ×3 IMPLANT
PACK SHOULDER CUSTOM OPM052 (CUSTOM PROCEDURE TRAY) ×3 IMPLANT
POSITIONER SURGICAL ARM (MISCELLANEOUS) ×3 IMPLANT
RETRIEVER SUT HEWSON (MISCELLANEOUS) ×1 IMPLANT
SET HNDPC FAN SPRY TIP SCT (DISPOSABLE) ×2 IMPLANT
SLING ARM IMMOBILIZER LRG (SOFTGOODS) ×1 IMPLANT
SLING ARM IMMOBILIZER MED (SOFTGOODS) ×2 IMPLANT
SPONGE GAUZE 4X4 12PLY (GAUZE/BANDAGES/DRESSINGS) ×4 IMPLANT
SPONGE LAP 18X18 X RAY DECT (DISPOSABLE) ×5 IMPLANT
STRIP CLOSURE SKIN 1/2X4 (GAUZE/BANDAGES/DRESSINGS) ×6 IMPLANT
SUCTION FRAZIER TIP 10 FR DISP (SUCTIONS) ×3 IMPLANT
SUPPORT WRAP ARM LG (MISCELLANEOUS) ×3 IMPLANT
SUT ETHIBOND NAB CT1 #1 30IN (SUTURE) ×1 IMPLANT
SUT ETHILON 4 0 PS 2 18 (SUTURE) ×1 IMPLANT
SUT FIBERWIRE #2 38 T-5 BLUE (SUTURE) ×9
SUT MNCRL AB 4-0 PS2 18 (SUTURE) ×3 IMPLANT
SUT VIC AB 2-0 CT1 27 (SUTURE) ×6
SUT VIC AB 2-0 CT1 TAPERPNT 27 (SUTURE) IMPLANT
SUTURE FIBERWR #2 38 T-5 BLUE (SUTURE) IMPLANT
TOWEL OR 17X26 10 PK STRL BLUE (TOWEL DISPOSABLE) ×6 IMPLANT
WATER STERILE IRR 1500ML POUR (IV SOLUTION) ×3 IMPLANT

## 2012-10-10 NOTE — Transfer of Care (Signed)
Immediate Anesthesia Transfer of Care Note  Patient: Sabrina [name]  Procedure(s) Performed: Procedure(s) (LRB) with comments: REVERSE SHOULDER ARTHROPLASTY (Left) CARPAL TUNNEL RELEASE (Left)  Patient Location: PACU  Anesthesia Type:General  Level of Consciousness: awake, alert , sedated and patient cooperative  Airway & Oxygen Therapy: Patient Spontanous Breathing and Patient connected to face mask oxygen  Post-op Assessment: Report given to PACU RN and Post -op Vital signs reviewed and stable  Post vital signs: Reviewed and stable  Complications: No apparent anesthesia complications

## 2012-10-10 NOTE — H&P (Signed)
Sabrina [name] is an 72 y.o. female.   Chief Complaint: L shoulder pain, L CTS HPI: L shoulder RCT with arthropathy, failed conservative treatment.  Also with severe L CTS.  Past Medical History  Diagnosis Date  . Asthma   . Thyroid disease   . Arthritis   . History of skin cancer   . Hypothyroidism   . Anxiety   . Depression   . Osteoporosis   . Hyperlipidemia   . Myasthenia gravis     DOING WELL ON CELLCEPT-FOLLOWED BY DR. LISA HOBSON - [facility] NEUROLOGIST  . Hypertension     PT HAS LOST WEIGHT--B/P NOW WITHIN NORMALS--IS ON B/P MEDICINE  . Cancer     COUPLE OF SKIN CANCERS  . Pain     LEFT SHOULDER PAIN-TORN ROTATOR CUFF-VERY LIMITED ROM  . Carpal tunnel syndrome     LEFT --NUMBNESS  . Pain     TOP OF RIGHT SHOULDER--ROM OK AT PRESENT  . Rash, skin     LOWER ABDOMEN-FUNGAL INFECTION-PT HAS TOPICAL OINTMENT TO USE AS NEEDED.  Marland Kitchen Complication of anesthesia     BP FALLS/"ANAPHYLACTIC SHOCK"/ EXTREME TREMORS;  DID  FINE WITH SURGERIES APRIL AND OCT 2013 AT [facility] SURGERIES WERE SHORT PROCEDURES--PROBLEMS WITH ANESTHESIA SEEM TO OCCUR WITH THE LONGER PROCEDURES.THE TREMORS WERE AFTER HIP REPLACEMNT 22011-ARMS WERE FLAILING--PT STATES IT TOOK 4 DOSES OF DEMEROL BEFORE THE TREMORS STOPPED.    Past Surgical History  Procedure Date  . Tonsillectomy and adenoidectomy   . Total hip arthroplasty     right  . Left shoulder arthroscopy    . Carpal tunnel release 05/31/2012    Procedure: CARPAL TUNNEL RELEASE;  Surgeon: Kathryne Hitch, MD;  Location: [facility];  Service: Orthopedics;  Laterality: Left;  Left Open Carpal Tunnel Release  . Cerebral aneurysm repair 10/2005    GUGLIEMI COILS TO REPAIR ANEURYSM; NO RESIDUAL PROBLEMS--HAS FOLLOW UP MRI EVERY 1 OR 2 YRS  . Tubal ligation     ANAPHYLACTIC SHOCK AFTER THIS SURGERY-CAUSE THOUGHT TO  BE RELATED TO SODIUM PENTOTHAL  . 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 Joint replacement 10/19/09    RT HIP--SEVERE  TREMORS, FLAILING OF ARMS AFTER THIS SURGERY.  . Cholecystectomy     EXTREME DROP IN BLOOD PRESSURE WITH THIS SURGERY- YRS AGO- PT WAS 72 YRS OLD    Family History  Problem Relation Age of Onset  . Hypertension Mother   . Heart disease Mother     heart attack  . Stroke Father   . Heart disease Father     heart attack   Social History:  reports that she has never smoked. She has never used smokeless tobacco. She reports that she drinks alcohol. She reports that she does not use illicit drugs.  Allergies:  Allergies  Allergen Reactions  . Other Anaphylaxis    Sodium pentathol  . Aminoglycosides     Tobramycin, gentamycin, kanamycin, neomycin, streptomycin Can't take because of myasthenia gravis  . Avelox (Moxifloxacin Hcl In Nacl) Other (See Comments)    Can't take because of myasthenia gravis  . Beta Adrenergic Blockers     Proponolol, timolol maleate eyedrops Can't take because of myasthenia gravis  . Calcium Channel Blockers     Blood pressure medications Can't take because of myasthenia gravis  . Cephalosporins     Can't take because of myasthenia gravis  . Ciprofloxacin     Can't take because of myasthenia gravis  . Clindamycin/Lincomycin  May exascerbate myasthenia gravis  . Colistin     Can't take because of myasthenia gravis  . Fosamax (Alendronate Sodium)     LEG WEAKNESS  . Imuran (Azathioprine Sodium) Other (See Comments)    Kidneys shut down  . Iodinated Diagnostic Agents   . Iodine     Iodine contrast Can't take because of myasthenia gravis  . Macrolides And Ketolides     Erythromocin, azithromycin, telithromycin Can't take because of myasthenia gravis  . Magnesium-Containing Compounds     Including milk of magnesia, antacids containing magnesium hydroxide (maalox, mylanta) and epsom salts Can't take because of myasthenia gravis  . Norfloxacin     Can't take because of myasthenia gravis  . Ofloxacin     Can't take because of myasthenia gravis  .  Pefloxacin     Can't take because of myasthenia gravis  . Penicillamine     do not use due to Myasthenia Gravis  . Procainamide     Can't take because of myasthenia gravis  . Quinidine     Can't take because of myasthenia gravis  . Quinine Derivatives     Can't take because of myasthenia gravis  . Succinylcholine     Can't take because of myasthenia gravis No paralytics  . Tubocurarine     Can't take because of myasthenia gravis  . Vecuronium     Can't take because of myasthenia gravis No paralytics    Medications Prior to Admission  Medication Sig Dispense Refill  . ALPRAZolam (XANAX) 0.5 MG tablet Take 0.5 mg by mouth every 6 (six) hours.       . Ascorbic Acid (VITAMIN C) 1000 MG tablet Take 1,000 mg by mouth every morning.       . Calcium Carbonate (CALTRATE 600 PO) Take 600 mg by mouth 2 (two) times daily.       . Cholecalciferol (VITAMIN D-3 PO) Take 1,000 mg by mouth every morning.       Marland Kitchen FLUoxetine (PROZAC) 20 MG tablet Take 20 mg by mouth every morning.       . Fluticasone-Salmeterol (ADVAIR DISKUS) 250-50 MCG/DOSE AEPB Inhale 1 puff into the lungs every 12 (twelve) hours.       . Glucosamine-Chondroitin (GLUCOSAMINE CHONDR COMPLEX PO) Take 1 tablet by mouth every morning.       . Ibuprofen-Diphenhydramine Cit (ADVIL PM PO) Take 1 tablet by mouth at bedtime.       Marland Kitchen levothyroxine (SYNTHROID, LEVOTHROID) 75 MCG tablet Take 75 mcg by mouth every morning. MUST TAKE BRAND NAME SYNTHROID      . lisinopril-hydrochlorothiazide (PRINZIDE,ZESTORETIC) 20-12.5 MG per tablet Take 1 tablet by mouth every morning.       . montelukast (SINGULAIR) 10 MG tablet Take 10 mg by mouth at bedtime. MUST BE BRAND NAME ONLY-SINGULAIR      . Multiple Vitamin (MULITIVITAMIN WITH MINERALS) TABS Take 1 tablet by mouth daily.      . mycophenolate (CELLCEPT) 500 MG tablet Take 1,000 mg by mouth 2 (two) times daily.       . Omega-3 Fatty Acids (FISH OIL PO) Take 1,200 mg by mouth daily.       Bertram Gala Glycol-Propyl Glycol (SYSTANE OP) Apply 1 drop to eye every morning. To both eyes      . traZODone (DESYREL) 150 MG tablet Take 75 mg by mouth at bedtime.       . valACYclovir (VALTREX) 1000 MG tablet Take 1,000 mg by mouth daily. 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 ONLY TAKES BRAND NAME VALTREX      . albuterol (PROVENTIL HFA;VENTOLIN HFA) 108 (90 BASE) MCG/ACT inhaler Inhale 2 puffs into the lungs every 6 (six) hours as needed.        Results for orders placed during the hospital encounter of 10/10/12 (from the past 48 hour(s))  TYPE AND SCREEN     Status: Normal   Collection Time   10/10/12  8:30 AM      Component Value Range Comment   ABO/RH(D) A POS      Antibody Screen NEG      Sample Expiration 10/13/2012      No results found.  Review of Systems  All other systems reviewed and are negative.    Blood pressure 136/74, pulse 63, temperature 98.4 F (36.9 C), resp. rate 20, SpO2 100.00%. Physical Exam  Constitutional: She is oriented to person, place, and time. She appears well-developed and well-nourished.  HENT:  Head: Atraumatic.  Eyes: EOM are normal.  Cardiovascular: Intact distal pulses.   Respiratory: Effort normal.  Musculoskeletal:       Left shoulder: She exhibits decreased range of motion, tenderness, pain and decreased strength.  Neurological: She is alert and oriented to person, place, and time.  Skin: Skin is warm and dry.  Psychiatric: She has a normal mood and affect.     Assessment/Plan L shoulder DJD, full thickness RCT L severe CTS Plan L reverse TSA and CTR Risks / benefits of surgery discussed Consent on chart  NPO for OR Preop antibiotics   Erby Sanderson WILLIAM 10/10/2012, 10:29 AM

## 2012-10-10 NOTE — Anesthesia Postprocedure Evaluation (Signed)
  Anesthesia Post-op Note  Patient: Sabrina [name]  Procedure(s) Performed: Procedure(s) (LRB): REVERSE SHOULDER ARTHROPLASTY (Left) CARPAL TUNNEL RELEASE (Left)  Patient Location: PACU  Anesthesia Type: General  Level of Consciousness: awake and alert   Airway and Oxygen Therapy: Patient Spontanous Breathing  Post-op Pain: mild  Post-op Assessment: Post-op Vital signs reviewed, Patient's Cardiovascular Status Stable, Respiratory Function Stable, Patent Airway and No signs of Nausea or vomiting  Last Vitals:  Filed Vitals:   10/10/12 1430  BP: 132/65  Pulse: 71  Temp: 36.4 C  Resp: 12    Post-op Vital Signs: stable   Complications: No apparent anesthesia complications

## 2012-10-10 NOTE — Progress Notes (Signed)
Utilization review completed.  

## 2012-10-10 NOTE — Op Note (Signed)
Procedure(s): REVERSE SHOULDER ARTHROPLASTY CARPAL TUNNEL RELEASE Procedure Note  Sabrina [name] female 72 y.o. 10/10/2012  Procedure(s) and Anesthesia Type:   #1 Left REVERSE SHOULDER ARTHROPLASTY - General   #2 left CARPAL TUNNEL RELEASE - General   Indications:  73 y.o. 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 Pain and dysfunction interfered with quality of life and nonoperative treatment with activity modification, NSAIDS and injections failed. She also had severe left carpal tunnel syndrome documented by EMG and wished to have that treated at the same time as her shoulder.     Surgeon: Mable Paris   Assistants: Damita Lack PA-C [facility] was present and scrubbed throughout the procedure and was essential in positioning, retraction, exposure, and closure)  Anesthesia: General endotracheal anesthesia   Procedure Detail  REVERSE SHOULDER ARTHROPLASTY, CARPAL TUNNEL RELEASE   Estimated Blood Loss:  200 mL         Drains: 1 medium hemovac  Blood Given: none          Specimens: none        Complications:  * No complications entered in OR log *         Disposition: PACU - hemodynamically stable.         Condition: stable      OPERATIVE FINDINGS:  A DePuy non- cemented press-fit reverse total shoulder arthroplasty was placed with a  size 10 stem, a 38 glenosphere, and a 6-mm poly insert. The base plate  fixation was excellent. The left transcarpal ligament was completely released. The underlying nerve was healthy-appearing. The ligament was noted to be significantly thickened centrally.  PROCEDURE: The patient was identified in the preoperative holding area  where I personally marked the operative site after verifying site, side,  and procedure with the patient. the patient was taken back to the operating room where all extremities were  carefully padded in position after general anesthesia was induced. She   was placed in a beach-chair position and the operative upper extremity was  prepped and draped in a standard sterile fashion.   A sterile tourniquet was applied on the upper arm.  The limb was exsanguinated using an Esmarch dressing and the tourniquet was elevated to 250 mm mercury.  A 3 cm incision was made in line with the web space between the third and fourth ray extending distally from the flexion crease of the wrist. Dissection was carried down through subcutaneous fat to the palmar fascia which was opened longitudinally in line with the incision. Underlying muscle was swept off the transcarpal ligament and a small rent was made in the ligament with a 15 blade. A Freer elevator was then inserted proximally and distally to protect the contents of the carpal canal while a 15 blade was used under direct visualization to open proximally and distally. The proximal and distal extents of the transcarpal ligament were then carefully exposed and under direct visualization completely divided using tenotomy scissors. Great care was taken to protect the underlying nerve and distally the palmar arch. The transcarpal ligament was noted to be thickened centrally.  At the conclusion of the procedure the free edges of the transcarpal ligament or widely separated.  The wound was copiously irrigated with normal saline and subsequently closed in one layer with 4-0 nylon in an interrupted fashion.  A light sterile dressing was applied and then the arm was covered with a stockinette and Coban wrap for the remainder of the procedure. A nonsterile tourniquet was  let down and removed. A total tourniquet time was 9 minutes at 250 mm mercury.  An approximately 10- cm incision was made from the tip of the coracoid process to the center  point of the humerus at the level of the axilla. Dissection was carried  down through subcutaneous tissues to the level of the cephalic vein  which was taken laterally with the deltoid.  The pectoralis major was  retracted medially. The subdeltoid space was developed and the lateral  edge of the conjoined tendon was identified. The undersurface of  conjoined tendon was palpated and the musculocutaneous nerve was not in  the field. Retractor was placed underneath the conjoined and second  retractor was placed lateral into the deltoid. The circumflex humeral  artery and vessels were identified and clamped and coagulated. The  biceps tendon was absent.  The subscapularis was intact and tagged.  The  joint was then gently externally rotated while the capsule  And subscapularis were released  from the humeral neck around to just beyond the 6 o'clock position. At  this point, the joint was dislocated and the humeral head was presented  into the wound. The excessive osteophyte formation was removed with a  large rongeur and the intramedullary canal was then entered with  sequential reamers up to a 10-mm reamer which was felt to be the  appropriate size. The cutting guide was used to make the appropriate  head cut and the head was saved potentially bone grafting.  The proximal metaphyseal reaming guide was then placed and the appropriate size reamer was selected. The metaphyseal bone was then reamed. The trial implant was then placed. The trial was then left in place while the glenoid was exposed with the arm in an  abducted extended position. The anterior and posterior labrum were  completely excised and the capsule was released circumferentially to  allow for exposure of the glenoid for preparation. The guidepin was  placed using the guide in neutral angulation and the reamer was  placed over the guidepin to ream down to concentric surface. Superior  hand reamer was used as well. The central drill hole was then made and  stayed within the glenoid vault. The Metaglene was then impacted with   excellent press fit. The superior and inferior screws were then  drilled, measured, and  filled with appropriate-sized locking screw  alternatively tightening top and bottom to bring the Metaglene down  tightly against the bone. Posterior and anterior screws were then placed. Fixation was excellent.  The humerus was then again exposed and the trial implant was removed. The gleno sphere was placed in the metaphysis of the proximal humerus and then reduced to the base plate.  The glenosphere was then impacted over the [facility] taper and tightened down  with the screw. The trial implant was then again placed in the proximal humerus and the poly trials were tested and the above implant was felt to be the most appropriate soft tissue tensioning with excellent stability and  excellent range of motion. Therefore, final humeral stem was placed press fit with excellent.  And then the trial polyethylene inserts were tested again and the above implant was felt to be the most appropriate for final insertion. The joint was reduced taken through full range of motion and felt to be stable. Soft tissue tension was appropriate. A medium Hemovac drain was placed out underneath the deltoid prior to closure. The joint was then copiously irrigated with pulse  lavage and the wound  was then closed. The subscapularis was repaired using 3 #2 FiberWire sutures which were placed through drill holes prior to implantation of the implant.  Skin was closed with 2-0 Vicryl in a deep dermal layer and 4-0  Monocryl for skin closure. Steri-Strips were applied.terile  dressings were then applied as well as a sling. The patient was allowed  to awaken from general anesthesia, transferred to stretcher, and taken  to recovery room in stable condition.   POSTOPERATIVE PLAN: The patient will be kept in the hospital postoperatively  for pain control and therapy, she is planning on going to a skilled nursing facility postoperatively.

## 2012-10-10 NOTE — Anesthesia Preprocedure Evaluation (Signed)
Anesthesia Evaluation  Patient identified by MRN, date of birth, ID band Patient awake    Reviewed: Allergy & Precautions, H&P , NPO status , Patient's Chart, lab work & pertinent test results, reviewed documented beta blocker date and time   Airway Mallampati: II TM Distance: >3 FB Neck ROM: full    Dental   Pulmonary asthma ,  breath sounds clear to auscultation        Cardiovascular hypertension, On Medications negative cardio ROS  Rhythm:regular     Neuro/Psych PSYCHIATRIC DISORDERS  Neuromuscular disease    GI/Hepatic negative GI ROS, Neg liver ROS,   Endo/Other  Hypothyroidism   Renal/GU negative Renal ROS  negative genitourinary   Musculoskeletal   Abdominal   Peds  Hematology negative hematology ROS (+)   Anesthesia Other Findings See surgeon's H&P  myasthenia gravis noted- see previous notes  Reproductive/Obstetrics negative OB ROS                           Anesthesia Physical Anesthesia Plan  ASA: III  Anesthesia Plan: General   Post-op Pain Management:    Induction: Intravenous and Inhalational  Airway Management Planned: Oral ETT  Additional Equipment:   Intra-op Plan:   Post-operative Plan: Extubation in OR and Possible Post-op intubation/ventilation  Informed Consent: I have reviewed the patients History and Physical, chart, labs and discussed the procedure including the risks, benefits and alternatives for the proposed anesthesia with the patient or authorized representative who has indicated his/her understanding and acceptance.   Dental Advisory Given  Plan Discussed with: CRNA and Surgeon  Anesthesia Plan Comments:         Anesthesia Quick Evaluation

## 2012-10-11 ENCOUNTER — Encounter (HOSPITAL_COMMUNITY): Payer: Self-pay | Admitting: Orthopedic Surgery

## 2012-10-11 LAB — BASIC METABOLIC PANEL
BUN: 6 mg/dL (ref 6–23)
Calcium: 8.3 mg/dL — ABNORMAL LOW (ref 8.4–10.5)
GFR calc Af Amer: >90 mL/min (ref >90)
GFR calc non Af Amer: 84 mL/min — ABNORMAL LOW (ref >90)
Glucose, Bld: 119 mg/dL — ABNORMAL HIGH (ref 70–99)
Potassium: 3.2 mEq/L — ABNORMAL LOW (ref 3.5–5.1)
Sodium: 131 mEq/L — ABNORMAL LOW (ref 135–145)

## 2012-10-11 LAB — CBC
HCT: 35.8 % — ABNORMAL LOW (ref 36.0–46.0)
Hemoglobin: 12.2 g/dL (ref 12.0–15.0)
MCH: 32.9 pg (ref 26.0–34.0)
MCHC: 34.1 g/dL (ref 30.0–36.0)
RDW: 13.9 % (ref 11.5–15.5)

## 2012-10-11 MED ORDER — ASPIRIN 325 MG PO TBEC: 325.0000 mg | DELAYED_RELEASE_TABLET | Freq: Two times a day (BID) | ORAL | Status: DC

## 2012-10-11 MED ORDER — POTASSIUM CHLORIDE CRYS ER 20 MEQ PO TBCR
40.0000 meq | EXTENDED_RELEASE_TABLET | Freq: Once | ORAL | Status: AC
  Administered 2012-10-11: 40 meq via ORAL
  Filled 2012-10-11: qty 2

## 2012-10-11 MED ORDER — OXYCODONE-ACETAMINOPHEN 5-325 MG PO TABS: 1.0000 | ORAL_TABLET | ORAL | Status: DC | PRN

## 2012-10-11 NOTE — Progress Notes (Signed)
Clinical Social Work Department CLINICAL SOCIAL WORK PLACEMENT NOTE 10/11/2012  Patient:  Sabrina [name], Sabrina [name]  Account Number:  [id number] Admit date:  10/10/2012  Clinical Social Worker:  Cori Razor, LCSW  Date/time:  10/11/2012 08:06 AM  Clinical Social Work is seeking post-discharge placement for this patient at the following level of care:   SKILLED NURSING   (*CSW will update this form in Epic as items are completed)     Patient/family provided with [facility] Department of Clinical Social Work's list of facilities offering this level of care within the geographic area requested by the patient (or if unable, by the patient's family).  10/10/2012  Patient/family informed of their freedom to choose among providers that offer the needed level of care, that participate in Medicare, Medicaid or managed care program needed by the patient, have an available bed and are willing to accept the patient.    Patient/family informed of [facility]' ownership interest in [facility], as well as of the fact that they are under no obligation to receive care at this facility.  PASARR submitted to EDS on  PASARR number received from [employer] on 10/20/2009  FL2 transmitted to all facilities in geographic area requested by pt/family on  10/11/2012 FL2 transmitted to all facilities within larger geographic area on   Patient informed that his/her managed care company has contracts with or will negotiate with  certain facilities, including the following:     Patient/family informed of bed offers received:  10/11/2012 Patient chooses bed at [facility] Physician recommends and patient chooses bed at    Patient to be transferred to [facility] on   Patient to be transferred to facility by   The following physician request were entered in Epic:   Additional Comments:  Cori Razor LCSW [phone]

## 2012-10-11 NOTE — Evaluation (Addendum)
Occupational Therapy Evaluation Patient Details Name: Sabrina [name] MRN: [id number] DOB: 05-14-1941 Today's Date: 10/11/2012 Time: 0930-1003 OT Time Calculation (min): 33 min  OT Assessment / Plan / Recommendation Clinical Impression  This 72 year old female was admitted for L RTSA and CTR.  She plans rehab before home.  She will benefit from skilled OT for education to safely perform and direct others during adls and also to increase activity tolerance.    OT Assessment  Patient needs continued OT Services    Follow Up Recommendations  SNF    Barriers to Discharge      Equipment Recommendations  3 in 1 bedside comode    Recommendations for Other Services    Frequency  Min 2X/week    Precautions / Restrictions Precautions Precautions: Shoulder Type of Shoulder Precautions: sling on AAT x bathing/dressing.  No shoulder exercises Restrictions Weight Bearing Restrictions: Yes LUE Weight Bearing: Non weight bearing   Pertinent Vitals/Pain LUE painful--not rated.  Pt states she has a high pain tolerance.  Premedicated    ADL  Upper Body Bathing: Performed;Moderate assistance Where Assessed - Upper Body Bathing: Unsupported sitting Lower Body Bathing: Performed;Maximal assistance Where Assessed - Lower Body Bathing: Unsupported sit to stand Upper Body Dressing: Performed;Maximal assistance Where Assessed - Upper Body Dressing: Unsupported sitting Toilet Transfer: Performed;Minimal assistance Toilet Transfer Method: Stand pivot Toilet Transfer Equipment: Bedside commode Toileting - Clothing Manipulation and Hygiene: Performed;Maximal assistance Where Assessed - Engineer, mining and Hygiene: Sit to stand from 3-in-1 or toilet Transfers/Ambulation Related to ADLs: spt only: pt fatiques easilyl ADL Comments: educated on bathing/dressing without moving arm.  Wiggling fingers:  will double check with ortho re:  wrist movement as pt has also had CTR    OT Diagnosis:  Generalized weakness  OT Problem List: Impaired UE functional use;Pain;Decreased strength;Decreased activity tolerance;Impaired balance (sitting and/or standing);Decreased knowledge of use of DME or AE OT Treatment Interventions: Self-care/ADL training;Patient/family education;Balance training;Therapeutic activities   OT Goals Acute Rehab OT Goals OT Goal Formulation: With patient Time For Goal Achievement: 10/25/12 Potential to Achieve Goals: Good ADL Goals Pt Will Perform Lower Body Bathing: with min assist;Sit to stand from chair (with ae) ADL Goal: Lower Body Bathing - Progress: Goal set today Pt Will Perform Lower Body Dressing: with mod assist;Sit to stand from chair (pants with reacher) ADL Goal: Lower Body Dressing - Progress: Goal set today Pt Will Transfer to Toilet: with supervision;Anterior-posterior transfer;3-in-1 ADL Goal: Toilet Transfer - Progress: Goal set today Pt Will Perform Toileting - Hygiene: with mod assist;Sit to stand from 3-in-1/toilet ADL Goal: Toileting - Hygiene - Progress: Goal set today Miscellaneous OT Goals Miscellaneous OT Goal #1: pt will direct caregiver in UB adls following shoulder precautions OT Goal: Miscellaneous Goal #1 - Progress: Goal set today  Visit Information  Last OT Received On: 10/11/12 Assistance Needed: +1    Subjective Data  Subjective: I thought I'd have to put a shirt over this sling Patient Stated Goal: [place] for rehab.  Lives alone   Prior Functioning     Home Living Lives With: Alone Additional Comments: plans [facility] [facility] Prior Function Level of Independence: Independent Communication Communication: No difficulties Dominant Hand: Right         Vision/Perception     Cognition  Cognition Overall Cognitive Status: Appears within functional limits for tasks assessed/performed Arousal/Alertness: Awake/alert Orientation Level: Appears intact for tasks assessed Behavior During Session: [facility] for tasks  performed    Extremity/Trunk Assessment Left Upper Extremity Assessment  LUE ROM/Strength/Tone: Unable to fully assess;Due to precautions Right Upper Extremity Assessment RUE ROM/Strength/Tone: [facility] for tasks assessed     Mobility Bed Mobility Bed Mobility: Supine to Sit Supine to Sit: 4: Min assist Transfers Transfers: Sit to Stand Sit to Stand: 4: Min assist Details for Transfer Assistance: cues for hand placement     Exercise Donning/doffing shirt without moving shoulder:  (needs reinforcement for all shoulder education x pendulums:  n/a)    Balance     End of Session OT - End of Session Activity Tolerance: Patient limited by fatigue Patient left: in bed;with call bell/phone within reach  GO     [facility] 10/11/2012, 10:26 AM Marica Otter, OTR/L [phone] 10/11/2012

## 2012-10-11 NOTE — Progress Notes (Signed)
CSW assisting with d/c planning. FL2 in chart for MD signature. Pt has ST Rehab bed at [facility] for Monday if she is ready for d/c. CSW will continue to follow to assist with d/c planning.  Cori Razor LCSW [phone]

## 2012-10-11 NOTE — Progress Notes (Signed)
PATIENT ID: Sabrina [name]   1 Day Post-Op Procedure(s) (LRB): REVERSE SHOULDER ARTHROPLASTY (Left) CARPAL TUNNEL RELEASE (Left)  Subjective: Patient is doing well. Slept well over night. Complains pain in left shoulder but is well controlled and tolerable. No other complaints or concerns.   Objective:  Filed Vitals:   10/11/12 0508  BP: 113/65  Pulse: 65  Temp: 98.8 F (37.1 C)  Resp: 18     Awake, alert, orientated L shoulder and wrist dressings clean, dry, intact Drain removed today Fingers with some swelling, was re-wrapped today Wiggles fingers, distally intact to light touch Difficult to assess function of deltoid   Labs:   [facility] 10/11/12 0413  HGB 12.2   Basename 10/11/12 0413  WBC 9.6  RBC 3.71*  HCT 35.8*  PLT 294   Basename 10/11/12 0413  NA 131*  K 3.2*  CL 98  CO2 26  BUN 6  CREATININE 0.71  GLUCOSE 119*  CALCIUM 8.3*    Assessment and Plan: 1 day s/p left reverse shoulder arthroplasty, left carpal tunnel release Pain well tolerated, continue current pain mgmt K+ low, ordered replacement and will get another BMP tomorrow to monitor Drain removed today Left shoulder dressing to be changed tomorrow, wrist dressing can be removed Sunday and bandage applied Will work with PT/OT today, no ROM exercises Anticipate hospital stay until Sunday/Monday then d/c to [facility]  VTE proph: ASA 325mg  BID, SCDs

## 2012-10-11 NOTE — Progress Notes (Signed)
Clinical Social Work Department BRIEF PSYCHOSOCIAL ASSESSMENT 10/11/2012  Patient:  Sabrina [name], Sabrina [name]     Account Number:  [id number]     Admit date:  10/10/2012  Clinical Social Worker:  Candie Chroman  Date/Time:  10/11/2012 08:00 AM  Referred by:  Physician  Date Referred:  10/11/2012 Referred for  SNF Placement   Other Referral:   Interview type:  Patient Other interview type:    PSYCHOSOCIAL DATA Living Status:  WITH ADULT CHILDREN Admitted from facility:   Level of care:   Primary support name:  Lanetta Inch Primary support relationship to patient:  CHILD, ADULT Degree of support available:   supportive    CURRENT CONCERNS Current Concerns  Post-Acute Placement   Other Concerns:    SOCIAL WORK ASSESSMENT / PLAN Pt is a 72 yr old female living at home prior to hospitalization. CSW met with pt / daughter to assist with d/c planning. Pt has made prior arrangements to have ST Rehab at [facility] following hospital d/c . CSW has contacted SNF and d/c plan has been confirmed. CSW will follow to assist with d/c planning to SNF.   Assessment/plan status:  Psychosocial Support/Ongoing Assessment of Needs Other assessment/ plan:   Information/referral to community resources:   None needed at this time.    PATIENT'S/FAMILY'S RESPONSE TO PLAN OF CARE: Pt is looking forward to feeling better and having rehab at [facility].    Cori Razor LCSW [phone]

## 2012-10-11 NOTE — Evaluation (Signed)
Physical Therapy Evaluation Patient Details Name: Sabrina [name] MRN: [id number] DOB: 1941-06-03 Today's Date: 10/11/2012 Time: 4098-1191 PT Time Calculation (min): 32 min  PT Assessment / Plan / Recommendation Clinical Impression  Pt s/p reverse L TSR and carpal tunnel release presents with no use of L UE limiting abilty to self care and affecting balance with mobility and ambulation.    PT Assessment  Patient needs continued PT services    Follow Up Recommendations  SNF    Does the patient have the potential to tolerate intense rehabilitation      Barriers to Discharge Decreased caregiver support      Equipment Recommendations  None recommended by PT    Recommendations for Other Services OT consult   Frequency Min 5X/week    Precautions / Restrictions Precautions Precautions: Shoulder Type of Shoulder Precautions: sling on AAT x bathing/dressing.  No shoulder exercises Restrictions Weight Bearing Restrictions: Yes LUE Weight Bearing: Non weight bearing   Pertinent Vitals/Pain       Mobility  Bed Mobility Bed Mobility: Supine to Sit Supine to Sit: 4: Min guard;With rails;HOB flat Transfers Transfers: Sit to Stand;Stand to Sit Sit to Stand: 4: Min assist Stand to Sit: 4: Min assist Details for Transfer Assistance: cues for hand placement Ambulation/Gait Ambulation/Gait Assistance: 4: Min assist;3: Mod assist Ambulation Distance (Feet): 100 Feet (twice) Assistive device: Straight cane Ambulation/Gait Assistance Details: cues for sequencing with cane Gait Pattern: Step-to pattern;Decreased step length - left;Decreased step length - right;Lateral hip instability;Lateral trunk lean to right General Gait Details: increased BOS, increased ER Bil LEs, post lean to compensate for L arm strapped to front    Exercises General Exercises - Lower Extremity Ankle Circles/Pumps: AROM;Both;15 reps;Supine Donning/doffing shirt without moving shoulder:  (needs reinforcement for  all) Pendulum exercises (written home exercise program):  (not applicable)   PT Diagnosis: Difficulty walking;Abnormality of gait  PT Problem List: Decreased activity tolerance;Decreased mobility;Decreased balance;Decreased knowledge of use of DME;Pain;Obesity PT Treatment Interventions: DME instruction;Gait training;Stair training;Functional mobility training;Therapeutic activities;Therapeutic exercise;Patient/family education;Balance training   PT Goals Acute Rehab PT Goals PT Goal Formulation: With patient Time For Goal Achievement: 10/16/12 Potential to Achieve Goals: Good Pt will go Supine/Side to Sit: with supervision PT Goal: Supine/Side to Sit - Progress: Goal set today Pt will go Sit to Supine/Side: with supervision PT Goal: Sit to Supine/Side - Progress: Goal set today Pt will go Sit to Stand: with supervision PT Goal: Sit to Stand - Progress: Goal set today Pt will go Stand to Sit: with supervision PT Goal: Stand to Sit - Progress: Goal set today Pt will Ambulate: 51 - 150 feet;with min assist;with least restrictive assistive device PT Goal: Ambulate - Progress: Goal set today  Visit Information  Last PT Received On: 10/11/12 Assistance Needed: +1    Subjective Data  Subjective: I wasn't able to use my arm at all before surgery Patient Stated Goal: Rehab and home when able to care for self   Prior Functioning  Home Living Lives With: Alone Additional Comments: plans [place] [facility] Prior Function Level of Independence: Independent Able to Take Stairs?: Yes Driving: Yes Communication Communication: No difficulties Dominant Hand: Right    Cognition  Cognition Overall Cognitive Status: Appears within functional limits for tasks assessed/performed Arousal/Alertness: Awake/alert Orientation Level: Appears intact for tasks assessed Behavior During Session: [facility] for tasks performed    Extremity/Trunk Assessment Right Upper Extremity Assessment RUE ROM/Strength/Tone:  Unable to fully assess;Due to precautions Left Upper Extremity Assessment LUE ROM/Strength/Tone: [facility] for  tasks assessed Right Lower Extremity Assessment RLE ROM/Strength/Tone: [facility] for tasks assessed Left Lower Extremity Assessment LLE ROM/Strength/Tone: [facility] for tasks assessed   Balance    End of Session PT - End of Session Equipment Utilized During Treatment: Other (comment) (L UE sling) Patient left: in chair;with call bell/phone within reach Nurse Communication: Mobility status  GP     Clell Trahan 10/11/2012, 1:01 PM

## 2012-10-12 LAB — CBC
MCHC: 33.3 g/dL (ref 30.0–36.0)
Platelets: 320 10*3/uL (ref 150–400)
RDW: 14 % (ref 11.5–15.5)
WBC: 9.8 10*3/uL (ref 4.0–10.5)

## 2012-10-12 LAB — BASIC METABOLIC PANEL
BUN: 7 mg/dL (ref 6–23)
Creatinine, Ser: 0.89 mg/dL (ref 0.50–1.10)
GFR calc Af Amer: 73 mL/min — ABNORMAL LOW (ref >90)
GFR calc non Af Amer: 63 mL/min — ABNORMAL LOW (ref >90)

## 2012-10-12 NOTE — Progress Notes (Signed)
Physical Therapy Treatment Patient Details Name: Sabrina [name] MRN: [id number] DOB: [birth year] Today's Date: 10/12/2012 Time: 4098-1191 PT Time Calculation (min): 15 min  PT Assessment / Plan / Recommendation Comments on Treatment Session  Pt. continues with decreased balance during gait,  pt. oplans snf  monday.    Follow Up Recommendations  SNF     Does the patient have the potential to tolerate intense rehabilitation     Barriers to Discharge        Equipment Recommendations  None recommended by PT    Recommendations for Other Services    Frequency Min 5X/week   Plan Discharge plan remains appropriate;Frequency remains appropriate    Precautions / Restrictions Precautions Precautions: Fall Type of Shoulder Precautions: sling on AAT x bathing/dressing.  No shoulder exercises Required Braces or Orthoses: Other Brace/Splint Other Brace/Splint: sling   Pertinent Vitals/Pain 7/10 L shoulder.   Mobility  Bed Mobility Bed Mobility: Sit to Supine Sit to Supine: 4: Min guard;HOB flat Transfers Sit to Stand: 4: Min assist Stand to Sit: 4: Min guard Details for Transfer Assistance: cues for hand placement Ambulation/Gait Ambulation/Gait Assistance: 4: Min assist Ambulation Distance (Feet): 100 Feet Assistive device: Large base quad cane Ambulation/Gait Assistance Details: cane did not really provide pt with safe support. Gait Pattern: Step-to pattern;Wide base of support;Decreased stride length General Gait Details: increased BOS, increased ER Bil LEs, post lean to compensate for L arm strapped to front    Exercises     PT Diagnosis:    PT Problem List:   PT Treatment Interventions:     PT Goals Acute Rehab PT Goals Pt will go Sit to Supine/Side: with supervision PT Goal: Sit to Supine/Side - Progress: Progressing toward goal Pt will go Sit to Stand: with supervision PT Goal: Sit to Stand - Progress: Progressing toward goal Pt will go Stand to Sit: with  supervision PT Goal: Stand to Sit - Progress: Progressing toward goal Pt will Ambulate: 51 - 150 feet;with min assist;with least restrictive assistive device PT Goal: Ambulate - Progress: Progressing toward goal  Visit Information  Last PT Received On: 10/12/12 Assistance Needed: +1    Subjective Data  Subjective: I did not know that it would hurt this much   Cognition  Cognition Overall Cognitive Status: Appears within functional limits for tasks assessed/performed    Balance  Balance Balance Assessed: Yes Static Standing Balance Static Standing - Balance Support: Right upper extremity supported Static Standing - Level of Assistance: 4: Min assist Static Standing - Comment/# of Minutes: pt needs some external support  End of Session PT - End of Session Activity Tolerance: Patient tolerated treatment well;Patient limited by fatigue Patient left: in bed;with call bell/phone within reach Nurse Communication: Mobility status;Patient requests pain meds   GP     Rada Hay 10/12/2012, 1:04 PM

## 2012-10-12 NOTE — Progress Notes (Signed)
Pt doing well states px is well controlled, sleeping well through night, most of her pain is about the L shoulder, has been up with PT/OT and awaiting placement at [facility]. Slightly drowsy from medications otherwise no complaints  BP 115/74  Pulse 91  Temp(Src) 98.5 F (36.9 C) (Oral)  Resp 14  Ht 5\' 5"  (1.651 m)  Wt 83 kg (182 lb 15.7 oz)  BMI 30.45 kg/m2  SpO2 95% CBC    Component Value Date/Time   WBC 9.8 10/12/2012 0413   RBC 3.69* 10/12/2012 0413   HGB 12.0 10/12/2012 0413   HCT 36.0 10/12/2012 0413   PLT 320 10/12/2012 0413   MCV 97.6 10/12/2012 0413   MCH 32.5 10/12/2012 0413   MCHC 33.3 10/12/2012 0413   RDW 14.0 10/12/2012 0413   LYMPHSABS 2.1 10/04/2012 1355   MONOABS 0.5 10/04/2012 1355   EOSABS 0.1 10/04/2012 1355   BASOSABS 0.0 10/04/2012 1355   K up to 3.7 today  Pt laying comfortably in hospital bed with sling fitting appropriately on L arm. Bandages CDI, upon removal of L shoulder bandage incision is covered with steri strips, no active drainage, no sign of infection. L wrist bandage fitting appropriately CDI, DRP 2+ and equal BIL, cap refill <2sec. Neurovascularly intact.   A/P 2 days s/p left reverse shoulder arthroplasty & left carpal tunnel release by Dr Ave Filter Pain well controlled, continue current pain mgmt  K+ has normalized after supplementation yesterday  Left shoulder dressing changed today, wrist dressing to be removed Sunday and bandage applied  Will cont to work with PT/OT, no ROM exercises  Anticipate hospital stay until Sunday/Monday then d/c to [facility]   VTE proph: ASA 325mg  BID, SCDs

## 2012-10-13 NOTE — Progress Notes (Signed)
CSW was asked to speak with Pt concerning day of d/c.   CSW spoke with Pt concerning d/c planning to address any concerns.   Pt was allieved to know that she will be going to [employer].   Weekday CSW to f/u with D/C planning to [facility].    Leron Croak, LCSWA [employer] [phone]

## 2012-10-13 NOTE — Progress Notes (Signed)
Pt doing well states px is well controlled, has not had any px medicine since yesterday, sleeping well through night, most of her pain is about the L shoulder, has been up with PT/OT and awaiting placement at [facility]. Removed L wrist bandage on her own overnight secondary to tightness and discomfort. No complaints.   BP 108/71  Pulse 68  Temp(Src) 98.4 F (36.9 C) (Oral)  Resp 20  Ht 5\' 5"  (1.651 m)  Wt 83 kg (182 lb 15.7 oz)  BMI 30.45 kg/m2  SpO2 94%   CBC    Component Value Date/Time   WBC 9.8 10/12/2012 0413   RBC 3.69* 10/12/2012 0413   HGB 12.0 10/12/2012 0413   HCT 36.0 10/12/2012 0413   PLT 320 10/12/2012 0413   MCV 97.6 10/12/2012 0413   MCH 32.5 10/12/2012 0413   MCHC 33.3 10/12/2012 0413   RDW 14.0 10/12/2012 0413   LYMPHSABS 2.1 10/04/2012 1355   MONOABS 0.5 10/04/2012 1355   EOSABS 0.1 10/04/2012 1355   BASOSABS 0.0 10/04/2012 1355   Pt sitting up on edge of hospital bed with sling fitting appropriately on L arm. Bandage CDI about L shoulder. L wrist bandage has been removed, incision clean and without drainage, no erythema or signs of infection. DRP 2+ and equal BIL, cap refill <2sec. NVI, Neurovascularly intact.   A/P  3 days s/p left reverse shoulder arthroplasty & left carpal tunnel release by Dr Ave Filter   -Pain well controlled, continue current pain mgmt   -K+ has normalized after supplementation Friday   -Wrist dressing removed and changed today  -Will cont to work with PT/OT on ambulation, no ROM exercises   -Anticipate hospital stay until Monday then d/c to [facility]    -Pt has not been seen by Social Work yet for placement per pt.  -VTE proph: ASA 325mg  BID, SCDs

## 2012-10-13 NOTE — Progress Notes (Signed)
Occupational Therapy Treatment Patient Details Name: Sabrina [name] MRN: [id number] DOB: 07-20-1941 Today's Date: 10/13/2012 Time: 4098-1191 OT Time Calculation (min): 42 min  OT Assessment / Plan / Recommendation Comments on Treatment Session Pt is very motivated to return home at an independent level and to not reinjure her shoulder.  Focused on ADL without use of L UE, sling use.  Plan remains for SNF as pt is left alone much of the time.    Follow Up Recommendations  SNF    Barriers to Discharge       Equipment Recommendations       Recommendations for Other Services    Frequency Min 2X/week   Plan Discharge plan remains appropriate    Precautions / Restrictions Precautions Precautions: Shoulder;Fall Type of Shoulder Precautions: sling on AAT x bathing/dressing.  No shoulder exercises Precaution Comments: educated pt in shoulder precautions Required Braces or Orthoses: Other Brace/Splint Other Brace/Splint: sling Restrictions Weight Bearing Restrictions: Yes LUE Weight Bearing: Non weight bearing   Pertinent Vitals/Pain No c/o pain    ADL  Eating/Feeding: Set up Where Assessed - Eating/Feeding: Chair Grooming: Wash/dry hands;Min guard Where Assessed - Grooming: Unsupported standing Upper Body Dressing: Minimal assistance (sling) Where Assessed - Upper Body Dressing: Unsupported standing Lower Body Dressing: Min guard Where Assessed - Lower Body Dressing: Unsupported sitting;Supported sit to stand Toilet Transfer: Min Pension scheme manager Method: Sit to Barista: Regular height toilet Toileting - Clothing Manipulation and Hygiene: Supervision/safety Where Assessed - Engineer, mining and Hygiene: Sit to stand from 3-in-1 or toilet Transfers/Ambulation Related to ADLs: min guard assist, no device ADL Comments: reeducated pt in ADL keeping L UE quiet, sling donning and doffing, pt able to perform LB ADL withou AE.    OT  Diagnosis:    OT Problem List:   OT Treatment Interventions:     OT Goals ADL Goals Pt Will Perform Lower Body Bathing: with min assist;Sit to stand from chair ADL Goal: Lower Body Bathing - Progress: Met Pt Will Perform Lower Body Dressing: with mod assist;Sit to stand from chair ADL Goal: Lower Body Dressing - Progress: Met Pt Will Transfer to Toilet: with supervision;Anterior-posterior transfer;3-in-1 ADL Goal: Toilet Transfer - Progress: Progressing toward goals Pt Will Perform Toileting - Hygiene: with mod assist;Sit to stand from 3-in-1/toilet ADL Goal: Toileting - Hygiene - Progress: Met Miscellaneous OT Goals Miscellaneous OT Goal #1: pt will direct caregiver in UB adls following shoulder precautions OT Goal: Miscellaneous Goal #1 - Progress: Progressing toward goals  Visit Information  Last OT Received On: 10/13/12 Assistance Needed: +1    Subjective Data      Prior Functioning       Cognition  Cognition Overall Cognitive Status: Appears within functional limits for tasks assessed/performed Arousal/Alertness: Awake/alert Orientation Level: Appears intact for tasks assessed Behavior During Session: Sabrina [name] for tasks performed    Mobility  Bed Mobility Bed Mobility: Not assessed Transfers Transfers: Sit to Stand;Stand to Sit Sit to Stand: 4: Min guard;With upper extremity assist;From chair/3-in-1;From toilet Stand to Sit: 4: Min guard;With upper extremity assist;To chair/3-in-1;To toilet    Exercises      Balance     End of Session OT - End of Session Activity Tolerance: Patient tolerated treatment well Patient left: in chair;with call bell/phone within reach  GO     Sabrina [name] 10/13/2012, 11:58 AM [phone]

## 2012-10-14 DIAGNOSIS — Z471 Aftercare following joint replacement surgery: Secondary | ICD-10-CM | POA: Diagnosis not present

## 2012-10-14 DIAGNOSIS — F329 Major depressive disorder, single episode, unspecified: Secondary | ICD-10-CM | POA: Diagnosis not present

## 2012-10-14 DIAGNOSIS — I10 Essential (primary) hypertension: Secondary | ICD-10-CM | POA: Diagnosis not present

## 2012-10-14 DIAGNOSIS — R279 Unspecified lack of coordination: Secondary | ICD-10-CM | POA: Diagnosis not present

## 2012-10-14 DIAGNOSIS — M19019 Primary osteoarthritis, unspecified shoulder: Secondary | ICD-10-CM | POA: Diagnosis not present

## 2012-10-14 DIAGNOSIS — J45909 Unspecified asthma, uncomplicated: Secondary | ICD-10-CM | POA: Diagnosis not present

## 2012-10-14 DIAGNOSIS — Z4789 Encounter for other orthopedic aftercare: Secondary | ICD-10-CM | POA: Diagnosis not present

## 2012-10-14 DIAGNOSIS — R269 Unspecified abnormalities of gait and mobility: Secondary | ICD-10-CM | POA: Diagnosis not present

## 2012-10-14 DIAGNOSIS — S4980XA Other specified injuries of shoulder and upper arm, unspecified arm, initial encounter: Secondary | ICD-10-CM | POA: Diagnosis not present

## 2012-10-14 DIAGNOSIS — M810 Age-related osteoporosis without current pathological fracture: Secondary | ICD-10-CM | POA: Diagnosis not present

## 2012-10-14 DIAGNOSIS — Z96619 Presence of unspecified artificial shoulder joint: Secondary | ICD-10-CM | POA: Diagnosis not present

## 2012-10-14 DIAGNOSIS — M6281 Muscle weakness (generalized): Secondary | ICD-10-CM | POA: Diagnosis not present

## 2012-10-14 DIAGNOSIS — M1990 Unspecified osteoarthritis, unspecified site: Secondary | ICD-10-CM | POA: Diagnosis not present

## 2012-10-14 DIAGNOSIS — E039 Hypothyroidism, unspecified: Secondary | ICD-10-CM | POA: Diagnosis not present

## 2012-10-14 DIAGNOSIS — F411 Generalized anxiety disorder: Secondary | ICD-10-CM | POA: Diagnosis not present

## 2012-10-14 DIAGNOSIS — M25519 Pain in unspecified shoulder: Secondary | ICD-10-CM | POA: Diagnosis not present

## 2012-10-14 DIAGNOSIS — M67919 Unspecified disorder of synovium and tendon, unspecified shoulder: Secondary | ICD-10-CM | POA: Diagnosis not present

## 2012-10-14 DIAGNOSIS — G7000 Myasthenia gravis without (acute) exacerbation: Secondary | ICD-10-CM | POA: Diagnosis not present

## 2012-10-14 NOTE — Progress Notes (Signed)
Physical Therapy Treatment Patient Details Name: Sabrina [name] MRN: [id number] DOB: 1941-06-17 Today's Date: 10/14/2012 Time: 3086-5784 PT Time Calculation (min): 25 min  PT Assessment / Plan / Recommendation Comments on Treatment Session  Assisted pt OOb to amb in hallway.  Pt plans to D/C to SNF today.    Follow Up Recommendations  SNF     Does the patient have the potential to tolerate intense rehabilitation     Barriers to Discharge        Equipment Recommendations  None recommended by PT    Recommendations for Other Services    Frequency Min 5X/week   Plan Discharge plan remains appropriate    Precautions / Restrictions Precautions Precautions: Shoulder;Fall Type of Shoulder Precautions: L UE sling Restrictions Weight Bearing Restrictions: Yes LUE Weight Bearing: Non weight bearing   Pertinent Vitals/Pain No c/o pain    Mobility  Bed Mobility Bed Mobility: Supine to Sit;Sitting - Scoot to Edge of Bed Supine to Sit: 6: Modified independent (Device/Increase time) Sitting - Scoot to Edge of Bed: 6: Modified independent (Device/Increase time) Details for Bed Mobility Assistance: Pt uses momentum and ABD muscles to get to EOB Transfers Transfers: Sit to Stand;Stand to Sit Sit to Stand: 4: Min guard;From bed Stand to Sit: 4: Min guard;To bed Details for Transfer Assistance: one vc for safety with turns Ambulation/Gait Ambulation/Gait Assistance: 4: Min assist (Pt declined the use of [employer] "too clumsy") Ambulation Distance (Feet): 123 Feet Assistive device: None Ambulation/Gait Assistance Details: HHA with <25% Vc's on safety with turns.  Pt declined use of [employer] stating it was too "clumsy". States she usually uses a straight cane. Amb pt in hallway with no LOB. Gait Pattern: Step-through pattern General Gait Details: increased BOS     PT Goals                                   progressing    Visit Information  Last PT Received On: 10/14/12 Assistance  Needed: +1    Subjective Data      Cognition    good   Balance   fair  End of Session PT - End of Session Equipment Utilized During Treatment: Gait belt Activity Tolerance: Patient tolerated treatment well Patient left: in bed;with call bell/phone within reach   Felecia Shelling  PTA [facility]  Acute  Rehab Pager      [phone]

## 2012-10-14 NOTE — Progress Notes (Signed)
Patient cleared for discharge to [facility]. Packet copied and placed in [place]. ptar called for transportation.  Janayia Burggraf C. Jeter Tomey MSW, LCSW [phone]

## 2012-10-14 NOTE — Clinical Documentation Improvement (Signed)
    GENERIC DOCUMENTATION CLARIFICATION QUERY  THIS DOCUMENT IS NOT A PERMANENT PART OF THE MEDICAL RECORD  TO RESPOND TO THE THIS QUERY, FOLLOW THE INSTRUCTIONS BELOW:  1. If needed, update documentation for the patient's encounter via the notes activity.  2. Access this query again and click edit on the In [employer].  3. After updating, or not, click F2 to complete all highlighted (required) fields concerning your review. Select "additional documentation in the medical record" OR "no additional documentation provided".  4. Click Sign note button.  5. The deficiency will fall out of your In Basket *Please let us know if you are not able to complete this workflow by phone or e-mail (listed below).  Please update your documentation within the medical record to reflect your response to this query.                                                                                        10/14/12   Dear Eilene Ghazi McKenzie, PA-C / Associates,  In a better effort to capture your patient's severity of illness, reflect appropriate length of stay and utilization of resources, a review of the patient medical record has revealed the following indicators.    Based on your clinical judgment, please clarify and document in a progress note and/or discharge summary the clinical condition associated with the following supporting information:  In responding to this query please exercise your independent judgment.  The fact that a query is asked, does not imply that any particular answer is desired or expected.  Pt with K+ normalized after supplementation.   Clarification Needed   For accuracy of clinical documentation please write out the diagnosis represented by the K+ and document in the pn or d/c summary.   Best Practice Alert: Best clinical practice involves witting out names of clinical diagnoses or using approved abbreviations to describe clinical indicators.   Possible Clinical  Conditions?  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 possible, probable, suspected diagnoses MUST be documented at the time of discharge  Reviewed:  no additional documentation provided ljh  Thank You,  Enis Slipper RN, BSN, MSN/Inf, CCDS Clinical Documentation Specialist [facility] HIM Dept Pager: [phone] / E-mail: [email or web address]  Health Information Management [place]

## 2012-10-14 NOTE — Progress Notes (Signed)
PATIENT ID: Sabrina [name]   4 Days Post-Op Procedure(s) (LRB): REVERSE SHOULDER ARTHROPLASTY (Left) CARPAL TUNNEL RELEASE (Left)  Subjective: Doing well. Pain is well contolled. Has not taken pain rx for two days. Mild left shoulder soreness. Worked with PT/OT well. No complaints or concerns. Ready to dc to [facility]  Objective:  Filed Vitals:   10/14/12 0615  BP: 110/70  Pulse: 64  Temp: 97.9 F (36.6 C)  Resp: 18     Awake, alert, orientated L UE and L LE dressing c/d/i incisions appear benign, no erythema, warmth Wiggles fingers Distally NVI   Labs:   Recent Labs  10/12/12 0413  HGB 12.0   Recent Labs  10/12/12 0413  WBC 9.8  RBC 3.69*  HCT 36.0  PLT 320   Recent Labs  10/12/12 0413  NA 135  K 3.7  CL 100  CO2 27  BUN 7  CREATININE 0.89  GLUCOSE 122*  CALCIUM 8.3*    Assessment and Plan: 3 days s/p left reverse shoulder arthroplasty & left carpal tunnel release by Dr Ave Filter  Pain well controlled, continue current pain mgmt  Okay to remove dressings but patient prefers to leave on and that is okay d/c to [facility] today Percocet 5/325 and ASA 325mg  BID for 2 weeks   VTE proph: SCDs, ASA325mg  BID for 2 weeks

## 2012-10-16 DIAGNOSIS — M12819 Other specific arthropathies, not elsewhere classified, unspecified shoulder: Secondary | ICD-10-CM

## 2012-10-16 DIAGNOSIS — G5600 Carpal tunnel syndrome, unspecified upper limb: Secondary | ICD-10-CM

## 2012-10-16 NOTE — Discharge Summary (Signed)
Patient ID: Sabrina [name] MRN: [id number] DOB/AGE: 12-07-1940 72 y.o.  Admit date: 10/10/2012 Discharge date: 10/16/2012  Admission Diagnoses:  Principal Problem:   Rotator cuff arthropathy carpal tunnel syndrome  Discharge Diagnoses:  Same  Past Medical History  Diagnosis Date  . Asthma   . Thyroid disease   . Arthritis   . History of skin cancer   . Hypothyroidism   . Anxiety   . Depression   . Osteoporosis   . Hyperlipidemia   . Myasthenia gravis     DOING WELL ON CELLCEPT-FOLLOWED BY DR. LISA HOBSON - [facility] NEUROLOGIST  . Hypertension     PT HAS LOST WEIGHT--B/P NOW WITHIN NORMALS--IS ON B/P MEDICINE  . Cancer     COUPLE OF SKIN CANCERS  . Pain     LEFT SHOULDER PAIN-TORN ROTATOR CUFF-VERY LIMITED ROM  . Carpal tunnel syndrome     LEFT --NUMBNESS  . Pain     TOP OF RIGHT SHOULDER--ROM OK AT PRESENT  . Rash, skin     LOWER ABDOMEN-FUNGAL INFECTION-PT HAS TOPICAL OINTMENT TO USE AS NEEDED.  Marland Kitchen Complication of anesthesia     BP FALLS/"ANAPHYLACTIC SHOCK"/ EXTREME TREMORS;  DID  FINE WITH SURGERIES APRIL AND OCT 2013 AT [facility] SURGERIES WERE SHORT PROCEDURES--PROBLEMS WITH ANESTHESIA SEEM TO OCCUR WITH THE LONGER PROCEDURES.THE TREMORS WERE AFTER HIP REPLACEMNT 22011-ARMS WERE FLAILING--PT STATES IT TOOK 4 DOSES OF DEMEROL BEFORE THE TREMORS STOPPED.    Surgeries: Procedure(s): REVERSE SHOULDER ARTHROPLASTY CARPAL TUNNEL RELEASE on 10/10/2012   Consultants:    Discharged Condition: Improved  Hospital Course: RORIE DELMORE is an 72 y.o. female who was admitted 10/10/2012 for operative treatment ofRotator cuff arthropathy and carpal tunnel syndrome. Patient has severe unremitting pain that affects sleep, daily activities, and work/hobbies and failed conservative treatment. After pre-op clearance the patient was taken to the operating room on 10/10/2012 and underwent  Procedure(s): REVERSE SHOULDER ARTHROPLASTY CARPAL TUNNEL RELEASE.    Patient was given perioperative  antibiotics:  Anti-infectives   Start     Dose/Rate Route Frequency Ordered Stop   10/10/12 2200  vancomycin (VANCOCIN) IVPB 1000 mg/200 mL premix  Status:  Discontinued    Comments:  AET: 2/6 @ 1333.  Pre-op dose given at 1000   1,000 mg 200 mL/hr over 60 Minutes Intravenous Every 12 hours 10/10/12 1530 10/10/12 1609   10/10/12 2200  vancomycin (VANCOCIN) IVPB 1000 mg/200 mL premix    Comments:  AET: 2/6 @ 1333.  Pre-op dose given at 1000   1,000 mg 200 mL/hr over 60 Minutes Intravenous Every 12 hours 10/10/12 1609 10/11/12 1137   10/10/12 1600  valACYclovir (VALTREX) tablet 1,000 mg  Status:  Discontinued     1,000 mg Oral Daily 10/10/12 1530 10/14/12 1923   10/10/12 0752  vancomycin (VANCOCIN) IVPB 1000 mg/200 mL premix     1,000 mg 200 mL/hr over 60 Minutes Intravenous On call to O.R. 10/10/12 0752 10/10/12 1000       Patient was given sequential compression devices, early ambulation, and ASA 325mg  BID to prevent DVT.  Patient benefited maximally from hospital stay and there were no complications. Potassium levels were below average day 1 post op and was repleated without problems.  Recent vital signs: No data found.    Recent laboratory studies: No results found for this basename: WBC, HGB, HCT, PLT, NA, K, CL, CO2, BUN, CREATININE, GLUCOSE, PT, INR, CALCIUM, 2,  in the last 72 hours   Discharge Medications:  Medication List    TAKE these medications       ADVAIR DISKUS 250-50 MCG/DOSE Aepb  Generic drug:  Fluticasone-Salmeterol  Inhale 1 puff into the lungs every 12 (twelve) hours.     ADVIL PM PO  Take 1 tablet by mouth at bedtime.     albuterol 108 (90 BASE) MCG/ACT inhaler  Commonly known as:  PROVENTIL HFA;VENTOLIN HFA  Inhale 2 puffs into the lungs every 6 (six) hours as needed.     ALPRAZolam 0.5 MG tablet  Commonly known as:  XANAX  Take 0.5 mg by mouth every 6 (six) hours.     aspirin 325 MG EC tablet  Take 1 tablet (325 mg total) by mouth 2 (two)  times daily.     CALTRATE 600 PO  Take 600 mg by mouth 2 (two) times daily.     CELLCEPT 500 MG tablet  Generic drug:  mycophenolate  Take 1,000 mg by mouth 2 (two) times daily.     FISH OIL PO  Take 1,200 mg by mouth daily.     FLUoxetine 20 MG tablet  Commonly known as:  PROZAC  Take 20 mg by mouth every morning.     GLUCOSAMINE CHONDR COMPLEX PO  Take 1 tablet by mouth every morning.     levothyroxine 75 MCG tablet  Commonly known as:  SYNTHROID, LEVOTHROID  Take 75 mcg by mouth every morning. MUST TAKE BRAND NAME SYNTHROID     lisinopril-hydrochlorothiazide 20-12.5 MG per tablet  Commonly known as:  PRINZIDE,ZESTORETIC  Take 1 tablet by mouth every morning.     montelukast 10 MG tablet  Commonly known as:  SINGULAIR  Take 10 mg by mouth at bedtime. MUST BE BRAND NAME ONLY-SINGULAIR     multivitamin with minerals Tabs  Take 1 tablet by mouth daily.     oxyCODONE-acetaminophen 5-325 MG per tablet  Commonly known as:  PERCOCET/ROXICET  Take 1-2 tablets by mouth every 4 (four) hours as needed.     SYSTANE OP  Apply 1 drop to eye every morning. To both eyes     traZODone 150 MG tablet  Commonly known as:  DESYREL  Take 75 mg by mouth at bedtime.     valACYclovir 1000 MG tablet  Commonly known as:  VALTREX  Take 1,000 mg by mouth daily. 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  ONLY TAKES BRAND NAME VALTREX     vitamin C 1000 MG tablet  Take 1,000 mg by mouth every morning.     VITAMIN D-3 PO  Take 1,000 mg by mouth every morning.        Diagnostic Studies: Dg Shoulder Left Port  10/10/2012  *RADIOLOGY REPORT*  Clinical Data: Total left shoulder arthroplasty.  PORTABLE LEFT SHOULDER - 2+ VIEW  Comparison: MRI 11/27/2011  Findings: The glenoid and humeral components of the total left shoulder arthroplasty appear well seated without complicating features.  IMPRESSION: Well seated components of a total left shoulder  arthroplasty.   Original Report Authenticated By: Rudie Meyer, M.D.     Disposition: 03-Skilled Nursing Facility      Discharge Orders   Future Orders Complete By Expires     Call MD / Call 911  As directed     Comments:      If you experience chest pain or shortness of breath, CALL 911 and be transported to the hospital emergency room.  If you develope a fever above 101 F, pus (  white drainage) or increased drainage or redness at the wound, or calf pain, call your surgeon's office.    Constipation Prevention  As directed     Comments:      Drink plenty of fluids.  Prune juice may be helpful.  You may use a stool softener, such as Colace (over the counter) 100 mg twice a day.  Use MiraLax (over the counter) for constipation as needed.    Diet - low sodium heart healthy  As directed     Driving restrictions  As directed     Comments:      No driving while in sling.    Increase activity slowly as tolerated  As directed     Lifting restrictions  As directed     Comments:      No lifting until cleared by physician.       Follow-up Information   Follow up with Mable Paris, MD. Schedule an appointment as soon as possible for a visit in 2 weeks.   Contact information:   [address] [place] [address] [phone]        Signed: Jiles Harold 10/16/2012, 8:39 AM

## 2012-10-23 DIAGNOSIS — M19019 Primary osteoarthritis, unspecified shoulder: Secondary | ICD-10-CM | POA: Diagnosis not present

## 2012-10-23 DIAGNOSIS — M67919 Unspecified disorder of synovium and tendon, unspecified shoulder: Secondary | ICD-10-CM | POA: Diagnosis not present

## 2012-11-01 DIAGNOSIS — Z471 Aftercare following joint replacement surgery: Secondary | ICD-10-CM | POA: Diagnosis not present

## 2012-11-01 DIAGNOSIS — IMO0001 Reserved for inherently not codable concepts without codable children: Secondary | ICD-10-CM | POA: Diagnosis not present

## 2012-11-01 DIAGNOSIS — Z96619 Presence of unspecified artificial shoulder joint: Secondary | ICD-10-CM | POA: Diagnosis not present

## 2012-11-01 DIAGNOSIS — F329 Major depressive disorder, single episode, unspecified: Secondary | ICD-10-CM | POA: Diagnosis not present

## 2012-11-01 DIAGNOSIS — I10 Essential (primary) hypertension: Secondary | ICD-10-CM | POA: Diagnosis not present

## 2012-11-06 DIAGNOSIS — IMO0001 Reserved for inherently not codable concepts without codable children: Secondary | ICD-10-CM | POA: Diagnosis not present

## 2012-11-06 DIAGNOSIS — I10 Essential (primary) hypertension: Secondary | ICD-10-CM | POA: Diagnosis not present

## 2012-11-06 DIAGNOSIS — Z471 Aftercare following joint replacement surgery: Secondary | ICD-10-CM | POA: Diagnosis not present

## 2012-11-06 DIAGNOSIS — F329 Major depressive disorder, single episode, unspecified: Secondary | ICD-10-CM | POA: Diagnosis not present

## 2012-11-06 DIAGNOSIS — Z96619 Presence of unspecified artificial shoulder joint: Secondary | ICD-10-CM | POA: Diagnosis not present

## 2012-11-12 DIAGNOSIS — Z471 Aftercare following joint replacement surgery: Secondary | ICD-10-CM | POA: Diagnosis not present

## 2012-11-12 DIAGNOSIS — I10 Essential (primary) hypertension: Secondary | ICD-10-CM | POA: Diagnosis not present

## 2012-11-12 DIAGNOSIS — Z96619 Presence of unspecified artificial shoulder joint: Secondary | ICD-10-CM | POA: Diagnosis not present

## 2012-11-12 DIAGNOSIS — F329 Major depressive disorder, single episode, unspecified: Secondary | ICD-10-CM | POA: Diagnosis not present

## 2012-11-12 DIAGNOSIS — IMO0001 Reserved for inherently not codable concepts without codable children: Secondary | ICD-10-CM | POA: Diagnosis not present

## 2012-11-14 DIAGNOSIS — I10 Essential (primary) hypertension: Secondary | ICD-10-CM | POA: Diagnosis not present

## 2012-11-14 DIAGNOSIS — F329 Major depressive disorder, single episode, unspecified: Secondary | ICD-10-CM | POA: Diagnosis not present

## 2012-11-14 DIAGNOSIS — IMO0001 Reserved for inherently not codable concepts without codable children: Secondary | ICD-10-CM | POA: Diagnosis not present

## 2012-11-14 DIAGNOSIS — Z471 Aftercare following joint replacement surgery: Secondary | ICD-10-CM | POA: Diagnosis not present

## 2012-11-14 DIAGNOSIS — Z96619 Presence of unspecified artificial shoulder joint: Secondary | ICD-10-CM | POA: Diagnosis not present

## 2012-11-19 DIAGNOSIS — Z96619 Presence of unspecified artificial shoulder joint: Secondary | ICD-10-CM | POA: Diagnosis not present

## 2012-11-19 DIAGNOSIS — I10 Essential (primary) hypertension: Secondary | ICD-10-CM | POA: Diagnosis not present

## 2012-11-19 DIAGNOSIS — Z471 Aftercare following joint replacement surgery: Secondary | ICD-10-CM | POA: Diagnosis not present

## 2012-11-19 DIAGNOSIS — IMO0001 Reserved for inherently not codable concepts without codable children: Secondary | ICD-10-CM | POA: Diagnosis not present

## 2012-11-19 DIAGNOSIS — F329 Major depressive disorder, single episode, unspecified: Secondary | ICD-10-CM | POA: Diagnosis not present

## 2012-11-22 DIAGNOSIS — Z471 Aftercare following joint replacement surgery: Secondary | ICD-10-CM | POA: Diagnosis not present

## 2012-11-22 DIAGNOSIS — Z96619 Presence of unspecified artificial shoulder joint: Secondary | ICD-10-CM | POA: Diagnosis not present

## 2012-11-22 DIAGNOSIS — IMO0001 Reserved for inherently not codable concepts without codable children: Secondary | ICD-10-CM | POA: Diagnosis not present

## 2012-11-22 DIAGNOSIS — F329 Major depressive disorder, single episode, unspecified: Secondary | ICD-10-CM | POA: Diagnosis not present

## 2012-11-22 DIAGNOSIS — I10 Essential (primary) hypertension: Secondary | ICD-10-CM | POA: Diagnosis not present

## 2012-11-23 DIAGNOSIS — M719 Bursopathy, unspecified: Secondary | ICD-10-CM | POA: Diagnosis not present

## 2012-11-23 DIAGNOSIS — M67919 Unspecified disorder of synovium and tendon, unspecified shoulder: Secondary | ICD-10-CM | POA: Diagnosis not present

## 2012-11-23 DIAGNOSIS — M19019 Primary osteoarthritis, unspecified shoulder: Secondary | ICD-10-CM | POA: Diagnosis not present

## 2012-11-28 DIAGNOSIS — I10 Essential (primary) hypertension: Secondary | ICD-10-CM | POA: Diagnosis not present

## 2012-11-28 DIAGNOSIS — F329 Major depressive disorder, single episode, unspecified: Secondary | ICD-10-CM | POA: Diagnosis not present

## 2012-11-28 DIAGNOSIS — Z471 Aftercare following joint replacement surgery: Secondary | ICD-10-CM | POA: Diagnosis not present

## 2012-11-28 DIAGNOSIS — IMO0001 Reserved for inherently not codable concepts without codable children: Secondary | ICD-10-CM | POA: Diagnosis not present

## 2012-11-28 DIAGNOSIS — Z96619 Presence of unspecified artificial shoulder joint: Secondary | ICD-10-CM | POA: Diagnosis not present

## 2013-01-01 DIAGNOSIS — M19019 Primary osteoarthritis, unspecified shoulder: Secondary | ICD-10-CM | POA: Diagnosis not present

## 2013-02-19 DIAGNOSIS — M19019 Primary osteoarthritis, unspecified shoulder: Secondary | ICD-10-CM | POA: Diagnosis not present

## 2013-02-19 DIAGNOSIS — M719 Bursopathy, unspecified: Secondary | ICD-10-CM | POA: Diagnosis not present

## 2013-02-19 DIAGNOSIS — M67919 Unspecified disorder of synovium and tendon, unspecified shoulder: Secondary | ICD-10-CM | POA: Diagnosis not present

## 2013-04-02 DIAGNOSIS — M19019 Primary osteoarthritis, unspecified shoulder: Secondary | ICD-10-CM | POA: Diagnosis not present

## 2013-04-08 DIAGNOSIS — M25579 Pain in unspecified ankle and joints of unspecified foot: Secondary | ICD-10-CM | POA: Diagnosis not present

## 2013-04-08 DIAGNOSIS — M25569 Pain in unspecified knee: Secondary | ICD-10-CM | POA: Diagnosis not present

## 2013-04-08 DIAGNOSIS — M25559 Pain in unspecified hip: Secondary | ICD-10-CM | POA: Diagnosis not present

## 2013-04-23 DIAGNOSIS — M25569 Pain in unspecified knee: Secondary | ICD-10-CM | POA: Diagnosis not present

## 2013-05-21 ENCOUNTER — Other Ambulatory Visit: Payer: Self-pay

## 2013-05-21 DIAGNOSIS — Z1231 Encounter for screening mammogram for malignant neoplasm of breast: Secondary | ICD-10-CM

## 2013-06-06 DIAGNOSIS — Z6831 Body mass index (BMI) 31.0-31.9, adult: Secondary | ICD-10-CM | POA: Diagnosis not present

## 2013-06-06 DIAGNOSIS — L538 Other specified erythematous conditions: Secondary | ICD-10-CM | POA: Diagnosis not present

## 2013-06-30 ENCOUNTER — Ambulatory Visit: Payer: Medicare Other

## 2013-07-10 DIAGNOSIS — G7000 Myasthenia gravis without (acute) exacerbation: Secondary | ICD-10-CM | POA: Diagnosis not present

## 2013-07-10 DIAGNOSIS — Z79899 Other long term (current) drug therapy: Secondary | ICD-10-CM | POA: Diagnosis not present

## 2013-07-10 DIAGNOSIS — E039 Hypothyroidism, unspecified: Secondary | ICD-10-CM | POA: Diagnosis not present

## 2013-07-17 ENCOUNTER — Ambulatory Visit
Admission: RE | Admit: 2013-07-17 | Discharge: 2013-07-17 | Disposition: A | Discharge status: Home / Self Care | Payer: Medicare Other | Source: Ambulatory Visit

## 2013-07-17 DIAGNOSIS — Z1231 Encounter for screening mammogram for malignant neoplasm of breast: Secondary | ICD-10-CM

## 2013-07-23 DIAGNOSIS — D1801 Hemangioma of skin and subcutaneous tissue: Secondary | ICD-10-CM | POA: Diagnosis not present

## 2013-07-23 DIAGNOSIS — Z85828 Personal history of other malignant neoplasm of skin: Secondary | ICD-10-CM | POA: Diagnosis not present

## 2013-07-23 DIAGNOSIS — D239 Other benign neoplasm of skin, unspecified: Secondary | ICD-10-CM | POA: Diagnosis not present

## 2013-07-23 DIAGNOSIS — L821 Other seborrheic keratosis: Secondary | ICD-10-CM | POA: Diagnosis not present

## 2013-07-23 DIAGNOSIS — L538 Other specified erythematous conditions: Secondary | ICD-10-CM | POA: Diagnosis not present

## 2013-10-14 DIAGNOSIS — M810 Age-related osteoporosis without current pathological fracture: Secondary | ICD-10-CM | POA: Diagnosis not present

## 2013-10-14 DIAGNOSIS — E782 Mixed hyperlipidemia: Secondary | ICD-10-CM | POA: Diagnosis not present

## 2013-10-14 DIAGNOSIS — I10 Essential (primary) hypertension: Secondary | ICD-10-CM | POA: Diagnosis not present

## 2013-10-14 DIAGNOSIS — E039 Hypothyroidism, unspecified: Secondary | ICD-10-CM | POA: Diagnosis not present

## 2013-10-22 DIAGNOSIS — Z1331 Encounter for screening for depression: Secondary | ICD-10-CM | POA: Diagnosis not present

## 2013-10-22 DIAGNOSIS — Z0000 Encounter for general adult medical examination without abnormal findings: Secondary | ICD-10-CM | POA: Diagnosis not present

## 2013-10-22 DIAGNOSIS — E039 Hypothyroidism, unspecified: Secondary | ICD-10-CM | POA: Diagnosis not present

## 2013-10-22 DIAGNOSIS — E782 Mixed hyperlipidemia: Secondary | ICD-10-CM | POA: Diagnosis not present

## 2013-10-22 DIAGNOSIS — R059 Cough, unspecified: Secondary | ICD-10-CM | POA: Diagnosis not present

## 2013-10-22 DIAGNOSIS — E669 Obesity, unspecified: Secondary | ICD-10-CM | POA: Diagnosis not present

## 2013-10-22 DIAGNOSIS — R05 Cough: Secondary | ICD-10-CM | POA: Diagnosis not present

## 2013-10-22 DIAGNOSIS — I10 Essential (primary) hypertension: Secondary | ICD-10-CM | POA: Diagnosis not present

## 2013-10-22 DIAGNOSIS — Z23 Encounter for immunization: Secondary | ICD-10-CM | POA: Diagnosis not present

## 2013-10-22 DIAGNOSIS — J45909 Unspecified asthma, uncomplicated: Secondary | ICD-10-CM | POA: Diagnosis not present

## 2013-10-22 DIAGNOSIS — G7000 Myasthenia gravis without (acute) exacerbation: Secondary | ICD-10-CM | POA: Diagnosis not present

## 2013-10-23 DIAGNOSIS — Z1212 Encounter for screening for malignant neoplasm of rectum: Secondary | ICD-10-CM | POA: Diagnosis not present

## 2013-11-26 DIAGNOSIS — M76899 Other specified enthesopathies of unspecified lower limb, excluding foot: Secondary | ICD-10-CM | POA: Diagnosis not present

## 2013-12-17 DIAGNOSIS — M25569 Pain in unspecified knee: Secondary | ICD-10-CM | POA: Diagnosis not present

## 2013-12-17 DIAGNOSIS — M76899 Other specified enthesopathies of unspecified lower limb, excluding foot: Secondary | ICD-10-CM | POA: Diagnosis not present

## 2013-12-29 ENCOUNTER — Ambulatory Visit: Payer: Medicare Other | Attending: Orthopaedic Surgery | Admitting: Physical Therapy

## 2013-12-29 DIAGNOSIS — R262 Difficulty in walking, not elsewhere classified: Secondary | ICD-10-CM | POA: Diagnosis not present

## 2013-12-29 DIAGNOSIS — IMO0001 Reserved for inherently not codable concepts without codable children: Secondary | ICD-10-CM | POA: Insufficient documentation

## 2013-12-29 DIAGNOSIS — M25559 Pain in unspecified hip: Secondary | ICD-10-CM | POA: Diagnosis not present

## 2014-01-05 ENCOUNTER — Ambulatory Visit: Payer: Medicare Other | Attending: Orthopaedic Surgery | Admitting: Physical Therapy

## 2014-01-05 DIAGNOSIS — M25559 Pain in unspecified hip: Secondary | ICD-10-CM | POA: Insufficient documentation

## 2014-01-05 DIAGNOSIS — R262 Difficulty in walking, not elsewhere classified: Secondary | ICD-10-CM | POA: Diagnosis not present

## 2014-01-05 DIAGNOSIS — Z5189 Encounter for other specified aftercare: Secondary | ICD-10-CM | POA: Diagnosis not present

## 2014-01-06 DIAGNOSIS — Z01419 Encounter for gynecological examination (general) (routine) without abnormal findings: Secondary | ICD-10-CM | POA: Diagnosis not present

## 2014-01-06 DIAGNOSIS — Z124 Encounter for screening for malignant neoplasm of cervix: Secondary | ICD-10-CM | POA: Diagnosis not present

## 2014-01-07 ENCOUNTER — Ambulatory Visit: Payer: Medicare Other

## 2014-01-07 DIAGNOSIS — Z124 Encounter for screening for malignant neoplasm of cervix: Secondary | ICD-10-CM | POA: Diagnosis not present

## 2014-01-12 ENCOUNTER — Ambulatory Visit: Payer: Medicare Other | Admitting: Physical Therapy

## 2014-01-15 ENCOUNTER — Encounter: Payer: Medicare Other | Admitting: Physical Therapy

## 2014-01-28 ENCOUNTER — Ambulatory Visit: Payer: Medicare Other | Admitting: Rehabilitation

## 2014-02-20 ENCOUNTER — Ambulatory Visit (INDEPENDENT_AMBULATORY_CARE_PROVIDER_SITE_OTHER): Payer: Medicare Other | Admitting: Cardiovascular Disease

## 2014-02-20 ENCOUNTER — Encounter: Payer: Self-pay | Admitting: Cardiovascular Disease

## 2014-02-20 VITALS — BP 175/89 | HR 71 | Ht 65.0 in | Wt 184.3 lb

## 2014-02-20 DIAGNOSIS — R42 Dizziness and giddiness: Secondary | ICD-10-CM | POA: Diagnosis not present

## 2014-02-20 DIAGNOSIS — I498 Other specified cardiac arrhythmias: Secondary | ICD-10-CM

## 2014-02-20 DIAGNOSIS — R001 Bradycardia, unspecified: Secondary | ICD-10-CM | POA: Insufficient documentation

## 2014-02-20 DIAGNOSIS — I10 Essential (primary) hypertension: Secondary | ICD-10-CM | POA: Diagnosis not present

## 2014-02-20 DIAGNOSIS — R55 Syncope and collapse: Secondary | ICD-10-CM

## 2014-02-20 DIAGNOSIS — R609 Edema, unspecified: Secondary | ICD-10-CM

## 2014-02-20 DIAGNOSIS — M25559 Pain in unspecified hip: Secondary | ICD-10-CM | POA: Diagnosis not present

## 2014-02-20 DIAGNOSIS — M7989 Other specified soft tissue disorders: Secondary | ICD-10-CM | POA: Insufficient documentation

## 2014-02-20 NOTE — Assessment & Plan Note (Signed)
She is on lisinopril hydrochlorothiazide and has cut her dose in half on her own with therapy her blood pressures at home. Today her blood pressure is elevated because of severe back pain

## 2014-02-20 NOTE — Assessment & Plan Note (Signed)
She has asymmetry of the lower extremities. I'm going to get a venous duplex to rule out DVT

## 2014-02-20 NOTE — Progress Notes (Signed)
02/20/2014 Sabrina [name]   Nov 17, 1940  [id number]  Primary Physician Marton Redwood, MD Primary Cardiologist: Lorretta Harp MD [name]   HPI:  Sabrina [name] is a 73 year old moderately overweight divorced Caucasian female mother of 2, crit mother of 3 children is accompanied by one of her daughters today. Her primary care physician is Dr. Brigitte Pulse at [facility].. She has a history of hypertension as well as family history of heart disease. Sister died from doing accounting. She does have myasthenia gravis which is medically treated as well as hypothyroidism. Her last 6 weeks she developed episodes of bradycardia which has been symptomatic with associated dizziness, diaphoresis and presyncope.   Current Outpatient Prescriptions  Medication Sig Dispense Refill  . albuterol (PROVENTIL HFA;VENTOLIN HFA) 108 (90 BASE) MCG/ACT inhaler Inhale 2 puffs into the lungs every 6 (six) hours as needed.      . ALPRAZolam (XANAX) 0.5 MG tablet Take 0.5 mg by mouth every 6 (six) hours.       . Ascorbic Acid (VITAMIN C) 1000 MG tablet Take 1,000 mg by mouth every morning.       . Calcium Carbonate (CALTRATE 600 PO) Take 600 mg by mouth 2 (two) times daily.       . Cholecalciferol (VITAMIN D-3 PO) Take 1,000 mg by mouth every morning.       Marland Kitchen FLUoxetine (PROZAC) 20 MG tablet Take 20 mg by mouth every morning.       . Fluticasone-Salmeterol (ADVAIR DISKUS) 250-50 MCG/DOSE AEPB Inhale 1 puff into the lungs every 12 (twelve) hours.       . Ibuprofen-Diphenhydramine Cit (ADVIL PM PO) Take 1 tablet by mouth at bedtime.       Marland Kitchen levothyroxine (SYNTHROID, LEVOTHROID) 75 MCG tablet Take 75 mcg by mouth every morning. MUST TAKE BRAND NAME SYNTHROID      . lisinopril-hydrochlorothiazide (PRINZIDE,ZESTORETIC) 20-12.5 MG per tablet Take 1 tablet by mouth every morning.       . montelukast (SINGULAIR) 10 MG tablet Take 10 mg by mouth at bedtime. MUST BE BRAND NAME ONLY-SINGULAIR      .  Multiple Vitamin (MULITIVITAMIN WITH MINERALS) TABS Take 1 tablet by mouth daily.      . mycophenolate (CELLCEPT) 500 MG tablet Take 1,000 mg by mouth 2 (two) times daily.       . Omega-3 Fatty Acids (FISH OIL PO) Take 1,200 mg by mouth daily.       Vladimir Faster Glycol-Propyl Glycol (SYSTANE OP) Apply 1 drop to eye every morning. To both eyes      . traZODone (DESYREL) 150 MG tablet Take 75 mg by mouth at bedtime.       . valACYclovir (VALTREX) 1000 MG tablet Take 1,000 mg by mouth daily. 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 ONLY TAKES BRAND NAME VALTREX       No current facility-administered medications for this visit.    Allergies  Allergen Reactions  . Other Anaphylaxis    Sodium pentathol  . Aminoglycosides     Tobramycin, gentamycin, kanamycin, neomycin, streptomycin Can't take because of myasthenia gravis  . Avelox [Moxifloxacin Hcl In Nacl] Other (See Comments)    Can't take because of myasthenia gravis  . Beta Adrenergic Blockers     Proponolol, timolol maleate eyedrops Can't take because of myasthenia gravis  . Calcium Channel Blockers     Blood pressure medications Can't take because of myasthenia gravis  . Cephalosporins  Can't take because of myasthenia gravis  . Ciprofloxacin     Can't take because of myasthenia gravis  . Clindamycin/Lincomycin     May exascerbate myasthenia gravis  . Colistin     Can't take because of myasthenia gravis  . Fosamax [Alendronate Sodium]     LEG WEAKNESS  . Imuran [Azathioprine Sodium] Other (See Comments)    Kidneys shut down  . Iodinated Diagnostic Agents   . Iodine     Iodine contrast Can't take because of myasthenia gravis  . Macrolides And Ketolides     Erythromocin, azithromycin, telithromycin Can't take because of myasthenia gravis  . Magnesium-Containing Compounds     Including milk of magnesia, antacids containing magnesium hydroxide (maalox, mylanta) and epsom salts Can't  take because of myasthenia gravis  . Norfloxacin     Can't take because of myasthenia gravis  . Ofloxacin     Can't take because of myasthenia gravis  . Pefloxacin     Can't take because of myasthenia gravis  . Penicillamine     do not use due to Myasthenia Gravis  . Procainamide     Can't take because of myasthenia gravis  . Quinidine     Can't take because of myasthenia gravis  . Quinine Derivatives     Can't take because of myasthenia gravis  . Succinylcholine     Can't take because of myasthenia gravis No paralytics  . Tubocurarine     Can't take because of myasthenia gravis  . Vecuronium     Can't take because of myasthenia gravis No paralytics    History   Social History  . Marital Status: Single    Spouse Name: N/A    Number of Children: N/A  . Years of Education: N/A   Occupational History  . Not on file.   Social History Main Topics  . Smoking status: Never Smoker   . Smokeless tobacco: Never Used  . Alcohol Use: 0.0 oz/week     Comment: OCCASIONAL  . Drug Use: No  . Sexual Activity:    Other Topics Concern  . Not on file   Social History Narrative  . No narrative on file     Review of Systems: General: negative for chills, fever, night sweats or weight changes.  Cardiovascular: negative for chest pain, dyspnea on exertion, edema, orthopnea, palpitations, paroxysmal nocturnal dyspnea or shortness of breath Dermatological: negative for rash Respiratory: negative for cough or wheezing Urologic: negative for hematuria Abdominal: negative for nausea, vomiting, diarrhea, bright red blood per rectum, melena, or hematemesis Neurologic: negative for visual changes, syncope, or dizziness All other systems reviewed and are otherwise negative except as noted above.    Blood pressure 175/89, pulse 71, height 5\' 5"  (1.651 m), weight 184 lb 4.8 oz (83.598 kg).  General appearance: alert and no distress Neck: no adenopathy, no carotid bruit, no JVD, supple,  symmetrical, trachea midline and thyroid not enlarged, symmetric, no tenderness/mass/nodules Lungs: clear to auscultation bilaterally Heart: regular rate and rhythm, S1, S2 normal, no murmur, click, rub or gallop Extremities: asymmetry in her lower extremities left larger than right  EKG sinus rhythm at 71 with no ST or T wave changes  ASSESSMENT AND PLAN:   Essential hypertension She is on lisinopril hydrochlorothiazide and has cut her dose in half on her own with therapy her blood pressures at home. Today her blood pressure is elevated because of severe back pain  Bradycardia Patient had 6 weeks of symptomatic bradycardia associated with dizziness,  nausea and clamminess. She also has had excessive fatigue. Based on these symptoms as does her family history of heart disease and hypertension are concerned that this may be an ischemic equivalent. She is unable to exercise. I'm going to get a pharmacologic Myoview stress test as well as a 2-D echocardiogram and a monitor as well as routine lab work.  Swelling of left lower extremity She has asymmetry of the lower extremities. I'm going to get a venous duplex to rule out DVT      Lorretta Harp MD [facility], [facility] 02/20/2014 12:10 PM

## 2014-02-20 NOTE — Assessment & Plan Note (Signed)
Patient had 6 weeks of symptomatic bradycardia associated with dizziness, nausea and clamminess. She also has had excessive fatigue. Based on these symptoms as does her family history of heart disease and hypertension are concerned that this may be an ischemic equivalent. She is unable to exercise. I'm going to get a pharmacologic Myoview stress test as well as a 2-D echocardiogram and a monitor as well as routine lab work.

## 2014-02-20 NOTE — Patient Instructions (Signed)
  We will see you back in follow up after the tests.   Dr Gwenlyn Found has ordered : 1.  Echocardiogram. Echocardiography is a painless test that uses sound waves to create images of your heart. It provides your doctor with information about the size and shape of your heart and how well your heart's chambers and valves are working. This procedure takes approximately one hour. There are no restrictions for this procedure.   2. Lexiscan Myoview- this is a test that looks at the blood flow to your heart muscle.  It takes approximately 2 1/2 hours. Please follow instruction sheet, as given.  3.  Event monitor. Event monitors are medical devices that record the heart's electrical activity. Doctors most often [place] these monitors to diagnose arrhythmias. Arrhythmias are problems with the speed or rhythm of the heartbeat. The monitor is a small, portable device. You can wear one while you do your normal daily activities. This is usually used to diagnose what is causing palpitations/syncope (passing out).  4. Your physician recommends that you return for lab work today.  5. lower venous duplex. This test is an ultrasound of the veins in the legs. It looks at venous blood flow that carries blood from the heart to the legs or arms. Allow one hour for a Lower Venous exam. There are no restrictions or special instructions.

## 2014-02-23 DIAGNOSIS — M76899 Other specified enthesopathies of unspecified lower limb, excluding foot: Secondary | ICD-10-CM | POA: Diagnosis not present

## 2014-02-27 ENCOUNTER — Telehealth (HOSPITAL_COMMUNITY): Payer: Self-pay

## 2014-03-02 ENCOUNTER — Ambulatory Visit (HOSPITAL_BASED_OUTPATIENT_CLINIC_OR_DEPARTMENT_OTHER)
Admission: RE | Admit: 2014-03-02 | Discharge: 2014-03-02 | Disposition: A | Discharge status: Home / Self Care | Payer: Medicare Other | Source: Ambulatory Visit | Attending: Cardiology | Admitting: Cardiology

## 2014-03-02 ENCOUNTER — Ambulatory Visit (HOSPITAL_COMMUNITY)
Admission: RE | Admit: 2014-03-02 | Discharge: 2014-03-02 | Disposition: A | Discharge status: Home / Self Care | Payer: Medicare Other | Source: Ambulatory Visit | Attending: Cardiology | Admitting: Cardiology

## 2014-03-02 DIAGNOSIS — R55 Syncope and collapse: Secondary | ICD-10-CM | POA: Diagnosis not present

## 2014-03-02 DIAGNOSIS — I495 Sick sinus syndrome: Secondary | ICD-10-CM | POA: Diagnosis not present

## 2014-03-02 DIAGNOSIS — I498 Other specified cardiac arrhythmias: Secondary | ICD-10-CM | POA: Diagnosis not present

## 2014-03-02 DIAGNOSIS — R609 Edema, unspecified: Secondary | ICD-10-CM

## 2014-03-02 DIAGNOSIS — R001 Bradycardia, unspecified: Secondary | ICD-10-CM

## 2014-03-02 DIAGNOSIS — M7989 Other specified soft tissue disorders: Secondary | ICD-10-CM | POA: Diagnosis not present

## 2014-03-02 NOTE — Progress Notes (Signed)
2D Echocardiogram Complete.  03/02/2014   Marquelle Musgrave, RDCS  

## 2014-03-02 NOTE — Progress Notes (Signed)
Left Lower Ext. Venous Completed. Negative for DVT. A Baker's Cyst was noted. Oda Cogan, BS, RDMS, RVT

## 2014-03-03 LAB — TSH: TSH: 0.368 u[IU]/mL (ref 0.350–4.500)

## 2014-03-03 LAB — T4, FREE: FREE T4: 1.47 ng/dL (ref 0.80–1.80)

## 2014-03-04 ENCOUNTER — Ambulatory Visit (HOSPITAL_COMMUNITY)
Admission: RE | Admit: 2014-03-04 | Discharge: 2014-03-04 | Disposition: A | Discharge status: Home / Self Care | Payer: Medicare Other | Source: Ambulatory Visit | Attending: Cardiovascular Disease | Admitting: Cardiovascular Disease

## 2014-03-04 DIAGNOSIS — G7000 Myasthenia gravis without (acute) exacerbation: Secondary | ICD-10-CM | POA: Diagnosis not present

## 2014-03-04 DIAGNOSIS — R55 Syncope and collapse: Secondary | ICD-10-CM | POA: Insufficient documentation

## 2014-03-04 DIAGNOSIS — J45909 Unspecified asthma, uncomplicated: Secondary | ICD-10-CM | POA: Insufficient documentation

## 2014-03-04 MED ORDER — TECHNETIUM TC 99M SESTAMIBI GENERIC - CARDIOLITE
31.4000 | Freq: Once | INTRAVENOUS | Status: AC | PRN
  Administered 2014-03-04: 31 via INTRAVENOUS

## 2014-03-04 MED ORDER — TECHNETIUM TC 99M SESTAMIBI GENERIC - CARDIOLITE
10.9000 | Freq: Once | INTRAVENOUS | Status: AC | PRN
  Administered 2014-03-04: 10.9 via INTRAVENOUS

## 2014-03-04 MED ORDER — REGADENOSON 0.4 MG/5ML IV SOLN
0.4000 mg | Freq: Once | INTRAVENOUS | Status: AC
  Administered 2014-03-04: 0.4 mg via INTRAVENOUS

## 2014-03-04 NOTE — Procedures (Addendum)
[facility] [address] [place] [address] [place] [address] [phone]  Cardiology Nuclear Med Study  Sabrina [name] is a 73 y.o. female     MRN : [id number]     DOB: April 22, 1941  Procedure Date: 03/04/2014  Nuclear Med Background Indication for Stress Test:  Evaluation for Ischemia and Abnormal EKG History:  Asthma and myesthenia gravis;No prior NUC MPI for comparison. Cardiac Risk Factors: Family History - CAD, Hypertension, Lipids and Overweight  Symptoms:  Diaphoresis, Dizziness, DOE, Fatigue, Light-Headedness, Nausea and Near Syncope   Nuclear Pre-Procedure Caffeine/Decaff Intake:  1:00am NPO After: 11am   IV Site: R Forearm  IV 0.9% NS with Angio Cath:  22g  Chest Size (in):  n/a IV Started by: Rolene Course, RN  Height: 5\' 5"  (1.651 m)  Cup Size: C  BMI:  Body mass index is 30.62 kg/(m^2). Weight:  184 lb (83.462 kg)   Tech Comments:  n/a    Nuclear Med Study 1 or 2 day study: 1 day  Stress Test Type:  [place] [facility] Provider:  Quay Burow, MD   Resting Radionuclide: Technetium 44m Sestamibi  Resting Radionuclide Dose: 10.9 mCi   Stress Radionuclide:  Technetium 85m Sestamibi  Stress Radionuclide Dose: 31.4 mCi           Stress Protocol Rest HR: 62 Stress HR: 79  Rest BP: 154/85 Stress BP: 158/75  Exercise Time (min): n/a METS: n/a   Predicted Max HR: 147 bpm % Max HR: 55.1 bpm Rate Pressure Product: 12798  Dose of Adenosine (mg):  n/a Dose of Lexiscan: 0.4 mg  Dose of Atropine (mg): n/a Dose of Dobutamine: n/a mcg/kg/min (at max HR)  Stress Test Technologist: Leane Para, CCT Nuclear Technologist: Imagene Riches, CNMT   Rest Procedure:  Myocardial perfusion imaging was performed at rest 45 minutes following the intravenous administration of Technetium 80m Sestamibi. Stress Procedure:  The patient received IV Lexiscan 0.4 mg over 15-seconds.  Technetium 79m Sestamibi injected IV at 30-seconds.   There were no significant changes with Lexiscan.  Quantitative spect images were obtained after a 45 minute delay.  Transient Ischemic Dilatation (Normal <1.22):  1.02  QGS EDV:  64 ml QGS ESV:  26 ml LV Ejection Fraction: 60%       Rest ECG: NSR - Normal EKG  Stress ECG: No significant change from baseline ECG  QPS Raw Data Images:  Normal; no motion artifact; normal heart/lung ratio. Stress Images:  Normal homogeneous uptake in all areas of the myocardium. Rest Images:  Normal homogeneous uptake in all areas of the myocardium. Subtraction (SDS):  No evidence of ischemia.  Impression Exercise Capacity:  Lexiscan with no exercise. BP Response:  Normal blood pressure response. Clinical Symptoms:  No significant symptoms noted. ECG Impression:  No significant ST segment change suggestive of ischemia. Comparison with Prior Nuclear Study: No images to compare  Overall Impression:  Normal stress nuclear study.  LV Wall Motion:  NL LV Function; NL Wall Motion   Lorretta Harp, MD  03/04/2014 6:19 PM

## 2014-03-09 ENCOUNTER — Encounter: Payer: Self-pay | Admitting: *Deleted

## 2014-03-16 DIAGNOSIS — M76899 Other specified enthesopathies of unspecified lower limb, excluding foot: Secondary | ICD-10-CM | POA: Diagnosis not present

## 2014-03-19 NOTE — Telephone Encounter (Signed)
Encounter complete. 

## 2014-03-31 ENCOUNTER — Encounter: Payer: Self-pay | Admitting: Cardiovascular Disease

## 2014-03-31 ENCOUNTER — Ambulatory Visit (INDEPENDENT_AMBULATORY_CARE_PROVIDER_SITE_OTHER): Payer: Medicare Other | Admitting: Cardiovascular Disease

## 2014-03-31 VITALS — BP 138/78 | HR 71 | Ht 65.0 in | Wt 185.0 lb

## 2014-03-31 DIAGNOSIS — I10 Essential (primary) hypertension: Secondary | ICD-10-CM

## 2014-03-31 DIAGNOSIS — R001 Bradycardia, unspecified: Secondary | ICD-10-CM

## 2014-03-31 DIAGNOSIS — I498 Other specified cardiac arrhythmias: Secondary | ICD-10-CM

## 2014-03-31 DIAGNOSIS — M7989 Other specified soft tissue disorders: Secondary | ICD-10-CM

## 2014-03-31 NOTE — Assessment & Plan Note (Signed)
The swelling in her left calf is somewhat improved. Venous Doppler showed no evidence of DVT but there was a moderate sized Baker's cyst behind her left knee.

## 2014-03-31 NOTE — Assessment & Plan Note (Signed)
Controlled on current medications 

## 2014-03-31 NOTE — Assessment & Plan Note (Signed)
An event monitor showed no evidence of bradycardia

## 2014-03-31 NOTE — Progress Notes (Signed)
Sabrina [name]  returns today for followup of her outpatient noninvasive diagnostic tests. A Myoview stress test was normal. 2-D echo was normal. Venous Doppler showed no evidence of DVT and internally noted left lower extremity Baker's cyst. Monitor showed no evidence of bradycardia. She felt improved. She does have mild hyperlipidemia but admits to dietary indiscretion. I will see her back on an as-needed basis.  Lorretta Harp, M.D., [place], [facility], [place] [address] [place], [place]  [address]  [phone] 03/31/2014 3:25 PM

## 2014-03-31 NOTE — Patient Instructions (Signed)
Your physician recommends that you schedule a follow-up appointment As needed with Dr Gwenlyn Found

## 2014-04-16 DIAGNOSIS — Z79899 Other long term (current) drug therapy: Secondary | ICD-10-CM | POA: Diagnosis not present

## 2014-04-16 DIAGNOSIS — Z9889 Other specified postprocedural states: Secondary | ICD-10-CM | POA: Diagnosis not present

## 2014-04-16 DIAGNOSIS — G7000 Myasthenia gravis without (acute) exacerbation: Secondary | ICD-10-CM | POA: Diagnosis not present

## 2014-06-15 DIAGNOSIS — L309 Dermatitis, unspecified: Secondary | ICD-10-CM | POA: Diagnosis not present

## 2014-06-15 DIAGNOSIS — Z85828 Personal history of other malignant neoplasm of skin: Secondary | ICD-10-CM | POA: Diagnosis not present

## 2014-06-15 DIAGNOSIS — L438 Other lichen planus: Secondary | ICD-10-CM | POA: Diagnosis not present

## 2014-06-15 DIAGNOSIS — L821 Other seborrheic keratosis: Secondary | ICD-10-CM | POA: Diagnosis not present

## 2014-06-25 DIAGNOSIS — Z8679 Personal history of other diseases of the circulatory system: Secondary | ICD-10-CM | POA: Diagnosis not present

## 2014-06-25 DIAGNOSIS — Z09 Encounter for follow-up examination after completed treatment for conditions other than malignant neoplasm: Secondary | ICD-10-CM | POA: Diagnosis not present

## 2014-06-25 DIAGNOSIS — Z9889 Other specified postprocedural states: Secondary | ICD-10-CM | POA: Diagnosis not present

## 2014-06-25 DIAGNOSIS — I671 Cerebral aneurysm, nonruptured: Secondary | ICD-10-CM | POA: Diagnosis not present

## 2014-06-25 DIAGNOSIS — R930 Abnormal findings on diagnostic imaging of skull and head, not elsewhere classified: Secondary | ICD-10-CM | POA: Diagnosis not present

## 2014-07-16 DIAGNOSIS — Z23 Encounter for immunization: Secondary | ICD-10-CM | POA: Diagnosis not present

## 2014-08-03 DIAGNOSIS — M25561 Pain in right knee: Secondary | ICD-10-CM | POA: Diagnosis not present

## 2014-08-03 DIAGNOSIS — M729 Fibroblastic disorder, unspecified: Secondary | ICD-10-CM | POA: Diagnosis not present

## 2014-08-17 DIAGNOSIS — G7000 Myasthenia gravis without (acute) exacerbation: Secondary | ICD-10-CM | POA: Diagnosis not present

## 2014-08-17 DIAGNOSIS — H2513 Age-related nuclear cataract, bilateral: Secondary | ICD-10-CM | POA: Diagnosis not present

## 2014-09-09 DIAGNOSIS — Z85828 Personal history of other malignant neoplasm of skin: Secondary | ICD-10-CM | POA: Diagnosis not present

## 2014-09-09 DIAGNOSIS — L821 Other seborrheic keratosis: Secondary | ICD-10-CM | POA: Diagnosis not present

## 2014-09-09 DIAGNOSIS — D1801 Hemangioma of skin and subcutaneous tissue: Secondary | ICD-10-CM | POA: Diagnosis not present

## 2014-09-09 DIAGNOSIS — D225 Melanocytic nevi of trunk: Secondary | ICD-10-CM | POA: Diagnosis not present

## 2014-10-20 DIAGNOSIS — E782 Mixed hyperlipidemia: Secondary | ICD-10-CM | POA: Diagnosis not present

## 2014-10-20 DIAGNOSIS — Z0000 Encounter for general adult medical examination without abnormal findings: Secondary | ICD-10-CM | POA: Diagnosis not present

## 2014-10-20 DIAGNOSIS — M810 Age-related osteoporosis without current pathological fracture: Secondary | ICD-10-CM | POA: Diagnosis not present

## 2014-10-20 DIAGNOSIS — I10 Essential (primary) hypertension: Secondary | ICD-10-CM | POA: Diagnosis not present

## 2014-10-20 DIAGNOSIS — E039 Hypothyroidism, unspecified: Secondary | ICD-10-CM | POA: Diagnosis not present

## 2014-10-20 DIAGNOSIS — R8299 Other abnormal findings in urine: Secondary | ICD-10-CM | POA: Diagnosis not present

## 2014-10-23 DIAGNOSIS — J45909 Unspecified asthma, uncomplicated: Secondary | ICD-10-CM | POA: Diagnosis not present

## 2014-10-23 DIAGNOSIS — Z1389 Encounter for screening for other disorder: Secondary | ICD-10-CM | POA: Diagnosis not present

## 2014-10-23 DIAGNOSIS — E782 Mixed hyperlipidemia: Secondary | ICD-10-CM | POA: Diagnosis not present

## 2014-10-23 DIAGNOSIS — Z0000 Encounter for general adult medical examination without abnormal findings: Secondary | ICD-10-CM | POA: Diagnosis not present

## 2014-10-23 DIAGNOSIS — G7000 Myasthenia gravis without (acute) exacerbation: Secondary | ICD-10-CM | POA: Diagnosis not present

## 2014-10-23 DIAGNOSIS — I10 Essential (primary) hypertension: Secondary | ICD-10-CM | POA: Diagnosis not present

## 2014-10-23 DIAGNOSIS — E669 Obesity, unspecified: Secondary | ICD-10-CM | POA: Diagnosis not present

## 2014-10-23 DIAGNOSIS — F329 Major depressive disorder, single episode, unspecified: Secondary | ICD-10-CM | POA: Diagnosis not present

## 2014-10-23 DIAGNOSIS — Z6831 Body mass index (BMI) 31.0-31.9, adult: Secondary | ICD-10-CM | POA: Diagnosis not present

## 2014-10-23 DIAGNOSIS — M810 Age-related osteoporosis without current pathological fracture: Secondary | ICD-10-CM | POA: Diagnosis not present

## 2014-10-23 DIAGNOSIS — B009 Herpesviral infection, unspecified: Secondary | ICD-10-CM | POA: Diagnosis not present

## 2014-10-23 DIAGNOSIS — E039 Hypothyroidism, unspecified: Secondary | ICD-10-CM | POA: Diagnosis not present

## 2014-10-26 DIAGNOSIS — Z1212 Encounter for screening for malignant neoplasm of rectum: Secondary | ICD-10-CM | POA: Diagnosis not present

## 2014-11-04 DIAGNOSIS — R05 Cough: Secondary | ICD-10-CM | POA: Diagnosis not present

## 2014-11-04 DIAGNOSIS — J45909 Unspecified asthma, uncomplicated: Secondary | ICD-10-CM | POA: Diagnosis not present

## 2014-11-04 DIAGNOSIS — J209 Acute bronchitis, unspecified: Secondary | ICD-10-CM | POA: Diagnosis not present

## 2014-11-09 ENCOUNTER — Other Ambulatory Visit: Payer: Self-pay

## 2014-11-09 DIAGNOSIS — Z1231 Encounter for screening mammogram for malignant neoplasm of breast: Secondary | ICD-10-CM

## 2014-11-20 DIAGNOSIS — L859 Epidermal thickening, unspecified: Secondary | ICD-10-CM | POA: Diagnosis not present

## 2014-11-20 DIAGNOSIS — Z85828 Personal history of other malignant neoplasm of skin: Secondary | ICD-10-CM | POA: Diagnosis not present

## 2014-11-20 DIAGNOSIS — D485 Neoplasm of uncertain behavior of skin: Secondary | ICD-10-CM | POA: Diagnosis not present

## 2014-11-26 ENCOUNTER — Ambulatory Visit
Admission: RE | Admit: 2014-11-26 | Discharge: 2014-11-26 | Disposition: A | Discharge status: Home / Self Care | Payer: Medicare Other | Source: Ambulatory Visit

## 2014-11-26 DIAGNOSIS — Z1231 Encounter for screening mammogram for malignant neoplasm of breast: Secondary | ICD-10-CM

## 2014-12-31 DIAGNOSIS — M7061 Trochanteric bursitis, right hip: Secondary | ICD-10-CM | POA: Diagnosis not present

## 2015-01-13 DIAGNOSIS — M7061 Trochanteric bursitis, right hip: Secondary | ICD-10-CM | POA: Diagnosis not present

## 2015-03-16 DIAGNOSIS — Z6831 Body mass index (BMI) 31.0-31.9, adult: Secondary | ICD-10-CM | POA: Diagnosis not present

## 2015-03-16 DIAGNOSIS — M5416 Radiculopathy, lumbar region: Secondary | ICD-10-CM | POA: Diagnosis not present

## 2015-03-16 DIAGNOSIS — M545 Low back pain: Secondary | ICD-10-CM | POA: Diagnosis not present

## 2015-05-25 DIAGNOSIS — Z23 Encounter for immunization: Secondary | ICD-10-CM | POA: Diagnosis not present

## 2015-05-25 DIAGNOSIS — M5416 Radiculopathy, lumbar region: Secondary | ICD-10-CM | POA: Diagnosis not present

## 2015-05-25 DIAGNOSIS — M7062 Trochanteric bursitis, left hip: Secondary | ICD-10-CM | POA: Diagnosis not present

## 2015-05-25 DIAGNOSIS — Z6831 Body mass index (BMI) 31.0-31.9, adult: Secondary | ICD-10-CM | POA: Diagnosis not present

## 2015-05-27 ENCOUNTER — Other Ambulatory Visit: Payer: Self-pay | Admitting: Internal Medicine

## 2015-05-27 DIAGNOSIS — M5416 Radiculopathy, lumbar region: Secondary | ICD-10-CM

## 2015-06-01 ENCOUNTER — Ambulatory Visit
Admission: RE | Admit: 2015-06-01 | Discharge: 2015-06-01 | Disposition: A | Discharge status: Home / Self Care | Payer: Medicare Other | Source: Ambulatory Visit | Attending: Internal Medicine | Admitting: Internal Medicine

## 2015-06-01 DIAGNOSIS — M4806 Spinal stenosis, lumbar region: Secondary | ICD-10-CM | POA: Diagnosis not present

## 2015-06-01 DIAGNOSIS — M5416 Radiculopathy, lumbar region: Secondary | ICD-10-CM

## 2015-06-11 ENCOUNTER — Other Ambulatory Visit: Payer: Self-pay | Admitting: Neurosurgery

## 2015-06-11 DIAGNOSIS — M4806 Spinal stenosis, lumbar region: Secondary | ICD-10-CM | POA: Diagnosis not present

## 2015-06-11 DIAGNOSIS — M546 Pain in thoracic spine: Secondary | ICD-10-CM | POA: Diagnosis not present

## 2015-06-11 DIAGNOSIS — M4316 Spondylolisthesis, lumbar region: Secondary | ICD-10-CM | POA: Diagnosis not present

## 2015-06-11 DIAGNOSIS — M5416 Radiculopathy, lumbar region: Secondary | ICD-10-CM | POA: Diagnosis not present

## 2015-06-11 DIAGNOSIS — M545 Low back pain: Secondary | ICD-10-CM | POA: Diagnosis not present

## 2015-06-11 DIAGNOSIS — M4726 Other spondylosis with radiculopathy, lumbar region: Secondary | ICD-10-CM | POA: Diagnosis not present

## 2015-06-11 DIAGNOSIS — M5136 Other intervertebral disc degeneration, lumbar region: Secondary | ICD-10-CM | POA: Diagnosis not present

## 2015-06-12 ENCOUNTER — Other Ambulatory Visit: Payer: Medicare Other

## 2015-06-17 ENCOUNTER — Encounter (HOSPITAL_COMMUNITY): Payer: Self-pay

## 2015-06-17 ENCOUNTER — Other Ambulatory Visit (HOSPITAL_COMMUNITY): Payer: Self-pay | Admitting: *Deleted

## 2015-06-17 ENCOUNTER — Encounter (HOSPITAL_COMMUNITY)
Admission: RE | Admit: 2015-06-17 | Discharge: 2015-06-17 | Disposition: A | Discharge status: Home / Self Care | Payer: Medicare Other | Source: Ambulatory Visit | Attending: Neurosurgery | Admitting: Neurosurgery

## 2015-06-17 DIAGNOSIS — E785 Hyperlipidemia, unspecified: Secondary | ICD-10-CM | POA: Diagnosis not present

## 2015-06-17 DIAGNOSIS — M4806 Spinal stenosis, lumbar region: Secondary | ICD-10-CM | POA: Diagnosis not present

## 2015-06-17 DIAGNOSIS — I10 Essential (primary) hypertension: Secondary | ICD-10-CM | POA: Diagnosis not present

## 2015-06-17 DIAGNOSIS — G7000 Myasthenia gravis without (acute) exacerbation: Secondary | ICD-10-CM | POA: Diagnosis not present

## 2015-06-17 HISTORY — DX: Pneumonia, unspecified organism: J18.9

## 2015-06-17 HISTORY — DX: Unspecified cataract: H26.9

## 2015-06-17 LAB — BASIC METABOLIC PANEL
Anion gap: 12 (ref 5–15)
BUN: 7 mg/dL (ref 6–20)
CHLORIDE: 100 mmol/L — AB (ref 101–111)
CO2: 24 mmol/L (ref 22–32)
CREATININE: 0.78 mg/dL (ref 0.44–1.00)
Calcium: 10.3 mg/dL (ref 8.9–10.3)
GFR calc Af Amer: >60 mL/min (ref >60)
GFR calc non Af Amer: >60 mL/min (ref >60)
Glucose, Bld: 108 mg/dL — ABNORMAL HIGH (ref 65–99)
Potassium: 4.2 mmol/L (ref 3.5–5.1)
Sodium: 136 mmol/L (ref 135–145)

## 2015-06-17 LAB — ABO/RH: ABO/RH(D): A POS

## 2015-06-17 LAB — TYPE AND SCREEN
ABO/RH(D): A POS
Antibody Screen: NEGATIVE

## 2015-06-17 LAB — CBC
HCT: 42 % (ref 36.0–46.0)
Hemoglobin: 14.7 g/dL (ref 12.0–15.0)
MCH: 33.5 pg (ref 26.0–34.0)
MCHC: 35 g/dL (ref 30.0–36.0)
MCV: 95.7 fL (ref 78.0–100.0)
PLATELETS: 345 10*3/uL (ref 150–400)
RBC: 4.39 MIL/uL (ref 3.87–5.11)
RDW: 12.9 % (ref 11.5–15.5)
WBC: 7.2 10*3/uL (ref 4.0–10.5)

## 2015-06-17 LAB — SURGICAL PCR SCREEN
MRSA, PCR: NEGATIVE
Staphylococcus aureus: POSITIVE — AB

## 2015-06-17 MED ORDER — VANCOMYCIN HCL IN DEXTROSE 1-5 GM/200ML-% IV SOLN
1000.0000 mg | INTRAVENOUS | Status: AC
  Administered 2015-06-18: 1000 mg via INTRAVENOUS
  Filled 2015-06-17: qty 200

## 2015-06-17 NOTE — Progress Notes (Addendum)
Pt states the only "heart" issue she has ever had was bradycardia in 2015. She states she was seen by Dr. Gwenlyn Found and had an Echo and stress test and both came back normal and she's not had any bradycardia since.   Pt is concerned about anesthesia. She states in the past she has always had "bad" experiences with anesthesia, except for the past 2 surgeries. She is requesting that whatever was used in those 2 surgeries be used again. She requested Dr. Kalman Shan to be her anesthesiologist if he is available.   Pt has Myasthenia Gravis and has a long list of meds that she cannot take due to the Myasthenia Gravis.   Ibuprofen, Naprosyn and multivitamins were stopped 6 days prior to surgery.  Will have [place], [place] review chart.

## 2015-06-17 NOTE — Progress Notes (Signed)
Anesthesia Chart Review: Patient is a 74 year old female scheduled for L4-5 decompression with PLIF on 06/18/15 by Dr. Sherwood Gambler.  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 She was seen by cardiologist Dr. Gwenlyn Found in the past for bradycardia and LLE swelling. U/S was negative for DVT. Event monitor showed no bradycardia. Myoview and echo were unremarkable, so PRN cardiology follow-up recommended. PCP is listed as Dr. Marton Redwood.   Neurologist Dr. Preston Fleeting Hobson-Webb with [facility] (see Care Everywhere). Notations in [place] indicate that she is aware of patient's plans for back surgery. She has told patient ".Marland KitchenMarland KitchenAncef would not interfere with her myasthenia gravis."   For anesthesia complications, she reported: - Concerns regarding her history of Myasthenia gravis and medications that should be avoided including vecuronium, tubocurarine, succinylcholine, beta blockers, calcium channel blockers, and multiple antibiotics (see allergy list for a more complete list). She did really well with her 2013 and 2014 procedures done at [facility]. - Hypotension - Anaphylactic shock - Extreme tremors (required 4 doses of Demerol before they stopped) 10/19/09 Op note indicates case was done under spinal anesthesia.   Meds albuterol, Xanax, Prozac, Advair, levothyroxine, lisinopril-HCTZ, Singulair, Cellcept, omega-3 fatty acids, Percocet, trazodone, Valtrex.   06/17/15 EKG: SR with first degree AVB.  03/04/14 Nuclear stress test: Overall Impression: Normal stress nuclear study. LV Wall Motion: NL LV Function; NL Wall Motion. LVEF 60%.  03/02/14 Echo: Study Conclusions - Left ventricle: The cavity size was normal. Wall thickness was normal.  Systolic function was normal. The estimated ejection fraction was in the range of 55% to 60%. Wall motion was normal; there were no regional wall motion abnormalities. There was an increased relative contribution of atrial contraction toventricular filling. - Impressions: The quality of the echo is poor. The LV endocardium is not well seen so assessment of the LV function and wall motion isdifficult.  03/2014 Event Monitor: NSR without arrhythmias.  Preoperative labs noted.   She will meet with her anesthesiologist on the day of surgery to discuss the definitive anesthesia plan.  George Hugh [facility] Short Stay Center/Anesthesiology Phone [phone] 06/17/2015 4:46 PM

## 2015-06-17 NOTE — Progress Notes (Signed)
Notified pt's daughter, Vickii Chafe of positive PCR of staph. Will treat in AM.

## 2015-06-17 NOTE — Anesthesia Preprocedure Evaluation (Addendum)
Anesthesia Evaluation  Patient identified by MRN, date of birth, ID band Patient awake    Airway Mallampati: II  TM Distance: >3 FB Neck ROM: Full    Dental   Pulmonary asthma , pneumonia,    breath sounds clear to auscultation       Cardiovascular hypertension,  Rhythm:Regular Rate:Normal     Neuro/Psych Myasthenia gravis    GI/Hepatic Neg liver ROS, GI history noted. CE   Endo/Other  Hypothyroidism   Renal/GU negative Renal ROS     Musculoskeletal  (+) Arthritis ,   Abdominal   Peds  Hematology   Anesthesia Other Findings   Reproductive/Obstetrics                            Anesthesia Physical Anesthesia Plan  ASA: III  Anesthesia Plan: General   Post-op Pain Management:    Induction: Intravenous  Airway Management Planned: Oral ETT  Additional Equipment:   Intra-op Plan:   Post-operative Plan: Possible Post-op intubation/ventilation  Informed Consent: I have reviewed the patients History and Physical, chart, labs and discussed the procedure including the risks, benefits and alternatives for the proposed anesthesia with the patient or authorized representative who has indicated his/her understanding and acceptance.   Dental advisory given  Plan Discussed with: CRNA, Anesthesiologist and Surgeon  Anesthesia Plan Comments:         Anesthesia Quick Evaluation

## 2015-06-17 NOTE — Pre-Procedure Instructions (Signed)
Sabrina [name]  06/17/2015     Your procedure is scheduled on Friday, June 18, 2015 at 7:30 AM.   Report to [facility] Entrance "A" Admitting Office at 5:30 AM.   Call this number if you have problems the morning of surgery: [phone]     Remember:  Do not eat food or drink liquids after midnight tonight.  Take these medicines the morning of surgery with A SIP OF WATER: Alprazolam (Xanax), Fluoxetine (Prozac), Synthroid, Cellcept, Advair inhaler, Oxycodone - if needed, albuterol inhaler - if needed   Do not wear jewelry, make-up or nail polish.  Do not wear lotions, powders, or perfumes.  You may wear deodorant.  Do not shave 48 hours prior to surgery.    Do not bring valuables to the hospital.  [facility] is not responsible for any belongings or valuables.  Contacts, dentures or bridgework may not be worn into surgery.  Leave your suitcase in the car.  After surgery it may be brought to your room.  For patients admitted to the hospital, discharge time will be determined by your treatment team.  Special instructions:  [place] - Preparing for Surgery  Before surgery, you can play an important role.  Because skin is not sterile, your skin needs to be as free of germs as possible.  You can reduce the number of germs on you skin by washing with CHG (chlorahexidine gluconate) soap before surgery.  CHG is an antiseptic cleaner which kills germs and bonds with the skin to continue killing germs even after washing.  Please DO NOT use if you have an allergy to CHG or antibacterial soaps.  If your skin becomes reddened/irritated stop using the CHG and inform your nurse when you arrive at Short Stay.  Do not shave (including legs and underarms) for at least 48 hours prior to the first CHG shower.  You may shave your face.  Please follow these instructions carefully:   1.  Shower with CHG Soap the night before surgery and the                                morning of  Surgery.  2.  If you choose to wash your hair, wash your hair first as usual with your       normal shampoo.  3.  After you shampoo, rinse your hair and body thoroughly to remove the                      Shampoo.  4.  Use CHG as you would any other liquid soap.  You can apply chg directly       to the skin and wash gently with scrungie or a clean washcloth.  5.  Apply the CHG Soap to your body ONLY FROM THE NECK DOWN.        Do not use on open wounds or open sores.  Avoid contact with your eyes, ears, mouth and genitals (private parts).  Wash genitals (private parts) with your normal soap.  6.  Wash thoroughly, paying special attention to the area where your surgery        will be performed.  7.  Thoroughly rinse your body with warm water from the neck down.  8.  DO NOT shower/wash with your normal soap after using and rinsing off       the CHG Soap.  9.  Pat yourself dry with a clean towel.            10.  Wear clean pajamas.            11.  Place clean sheets on your bed the night of your first shower and do not        sleep with pets.  Day of Surgery  Do not apply any lotions the morning of surgery.  Please wear clean clothes to the hospital.   Please read over the following fact sheets that you were given. Pain Booklet, Coughing and Deep Breathing, Blood Transfusion Information, MRSA Information and Surgical Site Infection Prevention

## 2015-06-18 ENCOUNTER — Inpatient Hospital Stay (HOSPITAL_COMMUNITY)
Admission: RE | Admit: 2015-06-18 | Discharge: 2015-06-19 | DRG: 460 | Disposition: A | Discharge status: Home / Self Care | Payer: Medicare Other | Source: Ambulatory Visit | Attending: Neurosurgery | Admitting: Neurosurgery

## 2015-06-18 ENCOUNTER — Encounter (HOSPITAL_COMMUNITY)
Admission: RE | Disposition: A | Discharge status: Home / Self Care | Payer: Self-pay | Source: Ambulatory Visit | Attending: Neurosurgery

## 2015-06-18 ENCOUNTER — Encounter (HOSPITAL_COMMUNITY): Payer: Self-pay | Admitting: *Deleted

## 2015-06-18 ENCOUNTER — Inpatient Hospital Stay (HOSPITAL_COMMUNITY): Payer: Medicare Other

## 2015-06-18 ENCOUNTER — Inpatient Hospital Stay (HOSPITAL_COMMUNITY): Payer: Medicare Other | Admitting: Anesthesiology

## 2015-06-18 ENCOUNTER — Inpatient Hospital Stay (HOSPITAL_COMMUNITY): Payer: Medicare Other | Admitting: Vascular Surgery

## 2015-06-18 DIAGNOSIS — M1990 Unspecified osteoarthritis, unspecified site: Secondary | ICD-10-CM | POA: Diagnosis present

## 2015-06-18 DIAGNOSIS — M4316 Spondylolisthesis, lumbar region: Secondary | ICD-10-CM | POA: Diagnosis present

## 2015-06-18 DIAGNOSIS — Z888 Allergy status to other drugs, medicaments and biological substances status: Secondary | ICD-10-CM | POA: Diagnosis not present

## 2015-06-18 DIAGNOSIS — M48061 Spinal stenosis, lumbar region without neurogenic claudication: Secondary | ICD-10-CM | POA: Diagnosis present

## 2015-06-18 DIAGNOSIS — J45909 Unspecified asthma, uncomplicated: Secondary | ICD-10-CM | POA: Diagnosis present

## 2015-06-18 DIAGNOSIS — I10 Essential (primary) hypertension: Secondary | ICD-10-CM | POA: Diagnosis not present

## 2015-06-18 DIAGNOSIS — Z981 Arthrodesis status: Secondary | ICD-10-CM | POA: Diagnosis not present

## 2015-06-18 DIAGNOSIS — E039 Hypothyroidism, unspecified: Secondary | ICD-10-CM | POA: Diagnosis present

## 2015-06-18 DIAGNOSIS — Z91041 Radiographic dye allergy status: Secondary | ICD-10-CM | POA: Diagnosis not present

## 2015-06-18 DIAGNOSIS — M810 Age-related osteoporosis without current pathological fracture: Secondary | ICD-10-CM | POA: Diagnosis present

## 2015-06-18 DIAGNOSIS — M4806 Spinal stenosis, lumbar region: Secondary | ICD-10-CM | POA: Diagnosis not present

## 2015-06-18 DIAGNOSIS — Z791 Long term (current) use of non-steroidal anti-inflammatories (NSAID): Secondary | ICD-10-CM

## 2015-06-18 DIAGNOSIS — M47816 Spondylosis without myelopathy or radiculopathy, lumbar region: Secondary | ICD-10-CM | POA: Diagnosis not present

## 2015-06-18 DIAGNOSIS — M4726 Other spondylosis with radiculopathy, lumbar region: Secondary | ICD-10-CM | POA: Diagnosis present

## 2015-06-18 DIAGNOSIS — G7000 Myasthenia gravis without (acute) exacerbation: Secondary | ICD-10-CM | POA: Diagnosis not present

## 2015-06-18 DIAGNOSIS — Z79899 Other long term (current) drug therapy: Secondary | ICD-10-CM | POA: Diagnosis not present

## 2015-06-18 DIAGNOSIS — Z881 Allergy status to other antibiotic agents status: Secondary | ICD-10-CM

## 2015-06-18 DIAGNOSIS — M7138 Other bursal cyst, other site: Secondary | ICD-10-CM | POA: Diagnosis present

## 2015-06-18 DIAGNOSIS — M5136 Other intervertebral disc degeneration, lumbar region: Secondary | ICD-10-CM | POA: Diagnosis not present

## 2015-06-18 DIAGNOSIS — Z880 Allergy status to penicillin: Secondary | ICD-10-CM

## 2015-06-18 DIAGNOSIS — Z885 Allergy status to narcotic agent status: Secondary | ICD-10-CM

## 2015-06-18 DIAGNOSIS — Z96612 Presence of left artificial shoulder joint: Secondary | ICD-10-CM | POA: Diagnosis present

## 2015-06-18 DIAGNOSIS — E785 Hyperlipidemia, unspecified: Secondary | ICD-10-CM | POA: Diagnosis present

## 2015-06-18 DIAGNOSIS — Z96641 Presence of right artificial hip joint: Secondary | ICD-10-CM | POA: Diagnosis present

## 2015-06-18 DIAGNOSIS — Z9889 Other specified postprocedural states: Secondary | ICD-10-CM | POA: Diagnosis not present

## 2015-06-18 DIAGNOSIS — M4326 Fusion of spine, lumbar region: Secondary | ICD-10-CM | POA: Diagnosis not present

## 2015-06-18 DIAGNOSIS — M549 Dorsalgia, unspecified: Secondary | ICD-10-CM

## 2015-06-18 SURGERY — POSTERIOR LUMBAR FUSION 1 LEVEL
Anesthesia: General | Site: Back

## 2015-06-18 MED ORDER — LIDOCAINE HCL (CARDIAC) 20 MG/ML IV SOLN
INTRAVENOUS | Status: DC | PRN
  Administered 2015-06-18: 100 mg via INTRAVENOUS

## 2015-06-18 MED ORDER — ARTIFICIAL TEARS OP OINT
TOPICAL_OINTMENT | OPHTHALMIC | Status: AC
  Filled 2015-06-18: qty 3.5

## 2015-06-18 MED ORDER — ONDANSETRON HCL 4 MG/2ML IJ SOLN
INTRAMUSCULAR | Status: DC | PRN
  Administered 2015-06-18: 4 mg via INTRAVENOUS

## 2015-06-18 MED ORDER — MONTELUKAST SODIUM 10 MG PO TABS
10.0000 mg | ORAL_TABLET | Freq: Every day | ORAL | Status: DC
  Administered 2015-06-18: 10 mg via ORAL
  Filled 2015-06-18 (×2): qty 1

## 2015-06-18 MED ORDER — PROPOFOL 500 MG/50ML IV EMUL
INTRAVENOUS | Status: DC | PRN
  Administered 2015-06-18: 25 ug/kg/min via INTRAVENOUS

## 2015-06-18 MED ORDER — FENTANYL CITRATE (PF) 100 MCG/2ML IJ SOLN
25.0000 ug | INTRAMUSCULAR | Status: DC | PRN
  Administered 2015-06-18 (×2): 50 ug via INTRAVENOUS

## 2015-06-18 MED ORDER — LIDOCAINE HCL (CARDIAC) 20 MG/ML IV SOLN
INTRAVENOUS | Status: AC
  Filled 2015-06-18: qty 5

## 2015-06-18 MED ORDER — ROCURONIUM BROMIDE 100 MG/10ML IV SOLN
INTRAVENOUS | Status: DC | PRN
  Administered 2015-06-18: 25 mg via INTRAVENOUS

## 2015-06-18 MED ORDER — MUPIROCIN 2 % EX OINT
TOPICAL_OINTMENT | Freq: Two times a day (BID) | CUTANEOUS | Status: DC
  Administered 2015-06-18: 22:00:00 via NASAL

## 2015-06-18 MED ORDER — KETOROLAC TROMETHAMINE 30 MG/ML IJ SOLN
INTRAMUSCULAR | Status: AC
  Filled 2015-06-18: qty 1

## 2015-06-18 MED ORDER — ALBUTEROL SULFATE (2.5 MG/3ML) 0.083% IN NEBU: 3.0000 mL | INHALATION_SOLUTION | Freq: Four times a day (QID) | RESPIRATORY_TRACT | Status: DC | PRN

## 2015-06-18 MED ORDER — BISACODYL 10 MG RE SUPP
10.0000 mg | Freq: Every day | RECTAL | Status: DC | PRN
Start: 2015-06-18 — End: 2015-06-19

## 2015-06-18 MED ORDER — ACETAMINOPHEN 325 MG PO TABS: 650.0000 mg | ORAL_TABLET | ORAL | Status: DC | PRN

## 2015-06-18 MED ORDER — MENTHOL 3 MG MT LOZG: 1.0000 | LOZENGE | OROMUCOSAL | Status: DC | PRN

## 2015-06-18 MED ORDER — PHENOL 1.4 % MT LIQD: 1.0000 | OROMUCOSAL | Status: DC | PRN

## 2015-06-18 MED ORDER — FLUOXETINE HCL 20 MG PO TABS
20.0000 mg | ORAL_TABLET | Freq: Every morning | ORAL | Status: DC
  Filled 2015-06-18: qty 1

## 2015-06-18 MED ORDER — KETOROLAC TROMETHAMINE 30 MG/ML IJ SOLN
30.0000 mg | Freq: Once | INTRAMUSCULAR | Status: AC
  Administered 2015-06-18: 30 mg via INTRAVENOUS

## 2015-06-18 MED ORDER — PROPOFOL 10 MG/ML IV BOLUS
INTRAVENOUS | Status: AC
  Filled 2015-06-18: qty 20

## 2015-06-18 MED ORDER — THROMBIN 5000 UNITS EX SOLR
OROMUCOSAL | Status: DC | PRN
  Administered 2015-06-18: 10 mL via TOPICAL

## 2015-06-18 MED ORDER — FENTANYL CITRATE (PF) 100 MCG/2ML IJ SOLN
INTRAMUSCULAR | Status: DC | PRN
  Administered 2015-06-18 (×5): 50 ug via INTRAVENOUS
  Administered 2015-06-18: 200 ug via INTRAVENOUS
  Administered 2015-06-18 (×2): 50 ug via INTRAVENOUS

## 2015-06-18 MED ORDER — MAGNESIUM HYDROXIDE 400 MG/5ML PO SUSP: 30.0000 mL | Freq: Every day | ORAL | Status: DC | PRN

## 2015-06-18 MED ORDER — ALUM & MAG HYDROXIDE-SIMETH 200-200-20 MG/5ML PO SUSP: 30.0000 mL | Freq: Four times a day (QID) | ORAL | Status: DC | PRN

## 2015-06-18 MED ORDER — MIDAZOLAM HCL 2 MG/2ML IJ SOLN
INTRAMUSCULAR | Status: AC
  Filled 2015-06-18: qty 4

## 2015-06-18 MED ORDER — KCL IN DEXTROSE-NACL 20-5-0.45 MEQ/L-%-% IV SOLN
INTRAVENOUS | Status: DC
  Filled 2015-06-18 (×4): qty 1000

## 2015-06-18 MED ORDER — LEVOTHYROXINE SODIUM 75 MCG PO TABS
75.0000 ug | ORAL_TABLET | Freq: Every day | ORAL | Status: DC
  Filled 2015-06-18 (×2): qty 1

## 2015-06-18 MED ORDER — ONDANSETRON HCL 4 MG/2ML IJ SOLN
INTRAMUSCULAR | Status: AC
  Filled 2015-06-18: qty 2

## 2015-06-18 MED ORDER — FENTANYL CITRATE (PF) 250 MCG/5ML IJ SOLN
INTRAMUSCULAR | Status: AC
  Filled 2015-06-18: qty 5

## 2015-06-18 MED ORDER — HYDROCHLOROTHIAZIDE 12.5 MG PO CAPS
12.5000 mg | ORAL_CAPSULE | Freq: Every day | ORAL | Status: DC
  Administered 2015-06-18: 12.5 mg via ORAL
  Filled 2015-06-18: qty 1

## 2015-06-18 MED ORDER — SODIUM CHLORIDE 0.9 % IJ SOLN
INTRAMUSCULAR | Status: AC
  Filled 2015-06-18: qty 10

## 2015-06-18 MED ORDER — SODIUM CHLORIDE 0.9 % IV SOLN
8.0000 mg | Freq: Four times a day (QID) | INTRAVENOUS | Status: DC | PRN
  Filled 2015-06-18: qty 4

## 2015-06-18 MED ORDER — MYCOPHENOLATE MOFETIL 250 MG PO CAPS
1000.0000 mg | ORAL_CAPSULE | Freq: Two times a day (BID) | ORAL | Status: DC
  Administered 2015-06-18 (×2): 1000 mg via ORAL
  Filled 2015-06-18 (×4): qty 4

## 2015-06-18 MED ORDER — ACETAMINOPHEN 10 MG/ML IV SOLN
INTRAVENOUS | Status: AC
  Filled 2015-06-18: qty 100

## 2015-06-18 MED ORDER — LIDOCAINE HCL 4 % MT SOLN
OROMUCOSAL | Status: DC | PRN
Start: 2015-06-18 — End: 2015-06-18
  Administered 2015-06-18: 4 mL via TOPICAL

## 2015-06-18 MED ORDER — SODIUM CHLORIDE 0.9 % IJ SOLN: 3.0000 mL | INTRAMUSCULAR | Status: DC | PRN

## 2015-06-18 MED ORDER — ALBUMIN HUMAN 5 % IV SOLN
INTRAVENOUS | Status: DC | PRN
  Administered 2015-06-18 (×2): via INTRAVENOUS

## 2015-06-18 MED ORDER — SODIUM CHLORIDE 0.9 % IJ SOLN
3.0000 mL | Freq: Two times a day (BID) | INTRAMUSCULAR | Status: DC
  Administered 2015-06-18 (×2): 3 mL via INTRAVENOUS

## 2015-06-18 MED ORDER — LISINOPRIL 20 MG PO TABS
20.0000 mg | ORAL_TABLET | Freq: Every day | ORAL | Status: DC
  Administered 2015-06-18: 20 mg via ORAL
  Filled 2015-06-18: qty 1

## 2015-06-18 MED ORDER — 0.9 % SODIUM CHLORIDE (POUR BTL) OPTIME
TOPICAL | Status: DC | PRN
  Administered 2015-06-18: 1000 mL

## 2015-06-18 MED ORDER — NEOSTIGMINE METHYLSULFATE 10 MG/10ML IV SOLN
INTRAVENOUS | Status: AC
  Filled 2015-06-18: qty 1

## 2015-06-18 MED ORDER — ACETAMINOPHEN 10 MG/ML IV SOLN
INTRAVENOUS | Status: DC | PRN
  Administered 2015-06-18: 1000 mg via INTRAVENOUS

## 2015-06-18 MED ORDER — PHENYLEPHRINE HCL 10 MG/ML IJ SOLN
10.0000 mg | INTRAVENOUS | Status: DC | PRN
  Administered 2015-06-18: 10 ug/min via INTRAVENOUS

## 2015-06-18 MED ORDER — VALACYCLOVIR HCL 500 MG PO TABS
1000.0000 mg | ORAL_TABLET | Freq: Every day | ORAL | Status: DC
  Administered 2015-06-18: 1000 mg via ORAL
  Filled 2015-06-18 (×2): qty 2

## 2015-06-18 MED ORDER — LACTATED RINGERS IV SOLN
INTRAVENOUS | Status: DC | PRN
  Administered 2015-06-18 (×2): via INTRAVENOUS

## 2015-06-18 MED ORDER — OXYCODONE-ACETAMINOPHEN 5-325 MG PO TABS
1.0000 | ORAL_TABLET | ORAL | Status: DC | PRN
  Administered 2015-06-18 (×2): 2 via ORAL
  Administered 2015-06-19: 1 via ORAL
  Filled 2015-06-18 (×2): qty 2
  Filled 2015-06-18: qty 1

## 2015-06-18 MED ORDER — ONDANSETRON HCL 4 MG/2ML IJ SOLN: 4.0000 mg | Freq: Four times a day (QID) | INTRAMUSCULAR | Status: DC | PRN

## 2015-06-18 MED ORDER — MUPIROCIN 2 % EX OINT
1.0000 "application " | TOPICAL_OINTMENT | Freq: Once | CUTANEOUS | Status: AC
  Administered 2015-06-18: 1 via TOPICAL
  Filled 2015-06-18: qty 22

## 2015-06-18 MED ORDER — GLYCOPYRROLATE 0.2 MG/ML IJ SOLN
INTRAMUSCULAR | Status: AC
  Filled 2015-06-18: qty 1

## 2015-06-18 MED ORDER — SURGIFOAM 100 EX MISC
CUTANEOUS | Status: DC | PRN
  Administered 2015-06-18: 20 mL via TOPICAL

## 2015-06-18 MED ORDER — HYDROXYZINE HCL 50 MG/ML IM SOLN
50.0000 mg | INTRAMUSCULAR | Status: DC | PRN
  Filled 2015-06-18: qty 1

## 2015-06-18 MED ORDER — ACETAMINOPHEN 650 MG RE SUPP: 650.0000 mg | RECTAL | Status: DC | PRN

## 2015-06-18 MED ORDER — VITAMIN C 500 MG PO TABS
1000.0000 mg | ORAL_TABLET | Freq: Every morning | ORAL | Status: DC
  Administered 2015-06-18: 1000 mg via ORAL
  Filled 2015-06-18 (×2): qty 2

## 2015-06-18 MED ORDER — NEOSTIGMINE METHYLSULFATE 10 MG/10ML IV SOLN
INTRAVENOUS | Status: DC | PRN
Start: 2015-06-18 — End: 2015-06-18
  Administered 2015-06-18: 3 mg via INTRAVENOUS

## 2015-06-18 MED ORDER — HYDROXYZINE HCL 25 MG PO TABS: 50.0000 mg | ORAL_TABLET | ORAL | Status: DC | PRN

## 2015-06-18 MED ORDER — MORPHINE SULFATE (PF) 4 MG/ML IV SOLN: 4.0000 mg | INTRAVENOUS | Status: DC | PRN

## 2015-06-18 MED ORDER — HYDROCODONE-ACETAMINOPHEN 5-325 MG PO TABS: 1.0000 | ORAL_TABLET | ORAL | Status: DC | PRN

## 2015-06-18 MED ORDER — LIDOCAINE-EPINEPHRINE 1 %-1:100000 IJ SOLN
INTRAMUSCULAR | Status: DC | PRN
  Administered 2015-06-18: 10 mL

## 2015-06-18 MED ORDER — VANCOMYCIN HCL 1000 MG IV SOLR
INTRAVENOUS | Status: AC
  Filled 2015-06-18: qty 1000

## 2015-06-18 MED ORDER — EPHEDRINE SULFATE 50 MG/ML IJ SOLN
INTRAMUSCULAR | Status: AC
  Filled 2015-06-18: qty 1

## 2015-06-18 MED ORDER — ONDANSETRON HCL 4 MG PO TABS: 4.0000 mg | ORAL_TABLET | Freq: Four times a day (QID) | ORAL | Status: DC | PRN

## 2015-06-18 MED ORDER — TRAZODONE HCL 50 MG PO TABS
75.0000 mg | ORAL_TABLET | Freq: Every day | ORAL | Status: DC
  Administered 2015-06-18: 75 mg via ORAL
  Filled 2015-06-18 (×2): qty 1

## 2015-06-18 MED ORDER — KETOROLAC TROMETHAMINE 30 MG/ML IJ SOLN
30.0000 mg | Freq: Four times a day (QID) | INTRAMUSCULAR | Status: DC
  Administered 2015-06-18 (×2): 30 mg via INTRAVENOUS
  Filled 2015-06-18 (×3): qty 1

## 2015-06-18 MED ORDER — GLYCOPYRROLATE 0.2 MG/ML IJ SOLN
INTRAMUSCULAR | Status: DC | PRN
  Administered 2015-06-18: .4 mg via INTRAVENOUS
  Administered 2015-06-18: 0.2 mg via INTRAVENOUS

## 2015-06-18 MED ORDER — GLYCOPYRROLATE 0.2 MG/ML IJ SOLN
INTRAMUSCULAR | Status: AC
  Filled 2015-06-18: qty 2

## 2015-06-18 MED ORDER — SODIUM CHLORIDE 0.9 % IV SOLN
INTRAVENOUS | Status: DC | PRN
  Administered 2015-06-18: 11:00:00 via INTRAVENOUS

## 2015-06-18 MED ORDER — BUPIVACAINE HCL (PF) 0.5 % IJ SOLN
INTRAMUSCULAR | Status: DC | PRN
  Administered 2015-06-18: 10 mL

## 2015-06-18 MED ORDER — ALPRAZOLAM 0.5 MG PO TABS
0.5000 mg | ORAL_TABLET | Freq: Four times a day (QID) | ORAL | Status: DC
  Administered 2015-06-18 – 2015-06-19 (×4): 0.5 mg via ORAL
  Filled 2015-06-18 (×4): qty 1

## 2015-06-18 MED ORDER — SODIUM CHLORIDE 0.9 % IR SOLN
Status: DC | PRN
  Administered 2015-06-18: 500 mL

## 2015-06-18 MED ORDER — VANCOMYCIN HCL 1000 MG IV SOLR
INTRAVENOUS | Status: DC | PRN
  Administered 2015-06-18: 1000 mg

## 2015-06-18 MED ORDER — OXYCODONE-ACETAMINOPHEN 5-325 MG PO TABS
ORAL_TABLET | ORAL | Status: AC
  Filled 2015-06-18: qty 2

## 2015-06-18 MED ORDER — DEXAMETHASONE SODIUM PHOSPHATE 10 MG/ML IJ SOLN
INTRAMUSCULAR | Status: DC | PRN
  Administered 2015-06-18: 8 mg via INTRAVENOUS

## 2015-06-18 MED ORDER — FENTANYL CITRATE (PF) 100 MCG/2ML IJ SOLN
INTRAMUSCULAR | Status: AC
  Filled 2015-06-18: qty 2

## 2015-06-18 MED ORDER — MIDAZOLAM HCL 5 MG/5ML IJ SOLN
INTRAMUSCULAR | Status: DC | PRN
  Administered 2015-06-18: 2 mg via INTRAVENOUS

## 2015-06-18 MED ORDER — PROPOFOL 10 MG/ML IV BOLUS
INTRAVENOUS | Status: DC | PRN
  Administered 2015-06-18: 170 mg via INTRAVENOUS

## 2015-06-18 MED ORDER — SUFENTANIL CITRATE 50 MCG/ML IV SOLN
INTRAVENOUS | Status: AC
  Filled 2015-06-18: qty 1

## 2015-06-18 MED ORDER — ONDANSETRON HCL 4 MG/2ML IJ SOLN
4.0000 mg | Freq: Four times a day (QID) | INTRAMUSCULAR | Status: DC | PRN
  Administered 2015-06-19: 4 mg via INTRAVENOUS
  Filled 2015-06-18: qty 2

## 2015-06-18 MED ORDER — SUCCINYLCHOLINE CHLORIDE 20 MG/ML IJ SOLN
INTRAMUSCULAR | Status: AC
  Filled 2015-06-18: qty 1

## 2015-06-18 MED ORDER — ROCURONIUM BROMIDE 50 MG/5ML IV SOLN
INTRAVENOUS | Status: AC
  Filled 2015-06-18: qty 1

## 2015-06-18 MED ORDER — DEXAMETHASONE SODIUM PHOSPHATE 4 MG/ML IJ SOLN
INTRAMUSCULAR | Status: AC
  Filled 2015-06-18: qty 2

## 2015-06-18 MED ORDER — LISINOPRIL-HYDROCHLOROTHIAZIDE 20-12.5 MG PO TABS: 1.0000 | ORAL_TABLET | Freq: Every morning | ORAL | Status: DC

## 2015-06-18 SURGICAL SUPPLY — 73 items
ADH SKN CLS APL DERMABOND .7 (GAUZE/BANDAGES/DRESSINGS) ×2
ADH SKN CLS LQ APL DERMABOND (GAUZE/BANDAGES/DRESSINGS) ×1
BAG DECANTER FOR FLEXI CONT (MISCELLANEOUS) ×2 IMPLANT
BLADE CLIPPER SURG (BLADE) IMPLANT
BRUSH SCRUB EZ PLAIN DRY (MISCELLANEOUS) ×2 IMPLANT
BUR ACRON 5.0MM COATED (BURR) ×2 IMPLANT
BUR MATCHSTICK NEURO 3.0 LAGG (BURR) ×2 IMPLANT
CANISTER SUCT 3000ML PPV (MISCELLANEOUS) ×2 IMPLANT
CAP LCK SPNE (Orthopedic Implant) ×4 IMPLANT
CAP LOCK SPINE RADIUS (Orthopedic Implant) IMPLANT
CAP LOCKING (Orthopedic Implant) ×8 IMPLANT
CONT SPEC 4OZ CLIKSEAL STRL BL (MISCELLANEOUS) ×2 IMPLANT
COVER BACK TABLE 60X90IN (DRAPES) ×2 IMPLANT
DERMABOND ADHESIVE PROPEN (GAUZE/BANDAGES/DRESSINGS) ×1
DERMABOND ADVANCED (GAUZE/BANDAGES/DRESSINGS) ×2
DERMABOND ADVANCED .7 DNX12 (GAUZE/BANDAGES/DRESSINGS) ×2 IMPLANT
DERMABOND ADVANCED .7 DNX6 (GAUZE/BANDAGES/DRESSINGS) IMPLANT
DRAPE C-ARM 42X72 X-RAY (DRAPES) ×4 IMPLANT
DRAPE LAPAROTOMY 100X72X124 (DRAPES) ×2 IMPLANT
DRAPE POUCH INSTRU U-SHP 10X18 (DRAPES) ×2 IMPLANT
DRAPE PROXIMA HALF (DRAPES) IMPLANT
DRSG EMULSION OIL 3X3 NADH (GAUZE/BANDAGES/DRESSINGS) IMPLANT
ELECT REM PT RETURN 9FT ADLT (ELECTROSURGICAL) ×2
ELECTRODE REM PT RTRN 9FT ADLT (ELECTROSURGICAL) ×1 IMPLANT
GAUZE SPONGE 4X4 12PLY STRL (GAUZE/BANDAGES/DRESSINGS) ×2 IMPLANT
GAUZE SPONGE 4X4 16PLY XRAY LF (GAUZE/BANDAGES/DRESSINGS) IMPLANT
GLOVE BIOGEL PI IND STRL 8 (GLOVE) ×2 IMPLANT
GLOVE BIOGEL PI INDICATOR 8 (GLOVE) ×2
GLOVE ECLIPSE 7.5 STRL STRAW (GLOVE) ×4 IMPLANT
GLOVE EXAM NITRILE LRG STRL (GLOVE) IMPLANT
GLOVE EXAM NITRILE MD LF STRL (GLOVE) IMPLANT
GLOVE EXAM NITRILE XL STR (GLOVE) IMPLANT
GLOVE EXAM NITRILE XS STR PU (GLOVE) IMPLANT
GOWN STRL REUS W/ TWL LRG LVL3 (GOWN DISPOSABLE) ×1 IMPLANT
GOWN STRL REUS W/ TWL XL LVL3 (GOWN DISPOSABLE) ×1 IMPLANT
GOWN STRL REUS W/TWL 2XL LVL3 (GOWN DISPOSABLE) IMPLANT
GOWN STRL REUS W/TWL LRG LVL3 (GOWN DISPOSABLE) ×2
GOWN STRL REUS W/TWL XL LVL3 (GOWN DISPOSABLE) ×2
KIT BASIN OR (CUSTOM PROCEDURE TRAY) ×2 IMPLANT
KIT INFUSE SMALL (Orthopedic Implant) ×1 IMPLANT
KIT ROOM TURNOVER OR (KITS) ×2 IMPLANT
MILL MEDIUM DISP (BLADE) ×2 IMPLANT
NDL ASP BONE MRW 8GX15 (NEEDLE) IMPLANT
NDL SPNL 18GX3.5 QUINCKE PK (NEEDLE) IMPLANT
NDL SPNL 22GX3.5 QUINCKE BK (NEEDLE) ×2 IMPLANT
NEEDLE ASP BONE MRW 8GX15 (NEEDLE) ×2 IMPLANT
NEEDLE BONE MARROW 8GAX6 (NEEDLE) IMPLANT
NEEDLE SPNL 18GX3.5 QUINCKE PK (NEEDLE) IMPLANT
NEEDLE SPNL 22GX3.5 QUINCKE BK (NEEDLE) ×4 IMPLANT
NS IRRIG 1000ML POUR BTL (IV SOLUTION) ×2 IMPLANT
PACK LAMINECTOMY NEURO (CUSTOM PROCEDURE TRAY) ×2 IMPLANT
PAD ARMBOARD 7.5X6 YLW CONV (MISCELLANEOUS) ×6 IMPLANT
PATTIES SURGICAL .5 X.5 (GAUZE/BANDAGES/DRESSINGS) IMPLANT
PATTIES SURGICAL .5 X1 (DISPOSABLE) IMPLANT
PATTIES SURGICAL 1X1 (DISPOSABLE) IMPLANT
PEEK PLIF AVS 10X25X4 (Peek) ×2 IMPLANT
ROD 5.5X30MM (Rod) ×2 IMPLANT
SCREW 5.75X45MM (Screw) ×2 IMPLANT
SCREW RADIUS 5.75X50MM (Screw) ×2 IMPLANT
SPONGE LAP 4X18 X RAY DECT (DISPOSABLE) IMPLANT
SPONGE NEURO XRAY DETECT 1X3 (DISPOSABLE) IMPLANT
SPONGE SURGIFOAM ABS GEL 100 (HEMOSTASIS) ×2 IMPLANT
STRIP BIOACTIVE VITOSS 25X100X (Neuro Prosthesis/Implant) ×1 IMPLANT
STRIP BIOACTIVE VITOSS 25X52X4 (Orthopedic Implant) ×1 IMPLANT
SUT VIC AB 1 CT1 18XBRD ANBCTR (SUTURE) ×2 IMPLANT
SUT VIC AB 1 CT1 8-18 (SUTURE) ×4
SUT VIC AB 2-0 CP2 18 (SUTURE) ×4 IMPLANT
SYR 3ML LL SCALE MARK (SYRINGE) ×8 IMPLANT
SYR CONTROL 10ML LL (SYRINGE) ×2 IMPLANT
TOWEL OR 17X24 6PK STRL BLUE (TOWEL DISPOSABLE) ×2 IMPLANT
TOWEL OR 17X26 10 PK STRL BLUE (TOWEL DISPOSABLE) ×2 IMPLANT
TRAY FOLEY W/METER SILVER 14FR (SET/KITS/TRAYS/PACK) ×2 IMPLANT
WATER STERILE IRR 1000ML POUR (IV SOLUTION) ×2 IMPLANT

## 2015-06-18 NOTE — Addendum Note (Signed)
Addendum  created 06/18/15 1842 by Izora Gala, CRNA   Modules edited: Anesthesia Medication Administration

## 2015-06-18 NOTE — Addendum Note (Signed)
Addendum  created 06/18/15 1455 by Izora Gala, CRNA   Modules edited: Charges VN

## 2015-06-18 NOTE — Plan of Care (Signed)
Problem: Consults Goal: Diagnosis - Spinal Surgery Outcome: Completed/Met Date Met:  06/18/15 Thoraco/Lumbar Spine Fusion

## 2015-06-18 NOTE — H&P (Signed)
Subjective: Patient is a 74 y.o. 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 The patient has had left lumbar radicular pain for a couple of years, but it became severe and disabling about a month ago. She is limited in how long she can stand, and it is not been relieved with high-dose Percocet.  She is admitted now for an L4-5 lumbar decompression and stabilization, specifically a bilateral L4-5 lumbar laminectomy, facetectomy, and foraminotomy, bilateral L4-5 posterior lumbar interbody arthrodesis with interbody implants and bone graft, bilateral L4-5 posterior lateral arthrodesis with posterior instrumentation and bone graft.    Patient Active Problem List   Diagnosis Date Noted  . Essential hypertension 02/20/2014  . Bradycardia 02/20/2014  . Swelling of left lower extremity 02/20/2014  . Rotator cuff arthropathy 10/16/2012  . Carpal tunnel syndrome 10/16/2012  . Carpal tunnel syndrome of right wrist 05/31/2012  . Rotator cuff impingement syndrome 12/22/2011  . Irreducible incisional hernia 03/15/2011   Past Medical History  Diagnosis Date  . Asthma   . Thyroid disease   . Arthritis   . History of skin cancer   . Hypothyroidism   . Anxiety   . Depression   . Osteoporosis   . Hyperlipidemia   . Myasthenia gravis     DOING WELL ON CELLCEPT-FOLLOWED BY DR. LISA HOBSON - [facility] NEUROLOGIST  . Hypertension     PT HAS LOST WEIGHT--B/P NOW WITHIN NORMALS--IS ON B/P MEDICINE  . Cancer ([place])     COUPLE OF SKIN CANCERS  . Pain     LEFT SHOULDER PAIN-TORN ROTATOR CUFF-VERY LIMITED ROM  . Carpal tunnel syndrome     LEFT --NUMBNESS  . Pain     TOP OF RIGHT SHOULDER--ROM OK AT PRESENT  . Rash, skin     LOWER ABDOMEN-FUNGAL INFECTION-PT HAS TOPICAL  OINTMENT TO USE AS NEEDED.  Marland Kitchen Complication of anesthesia     BP FALLS/"ANAPHYLACTIC SHOCK"/ EXTREME TREMORS;  DID  FINE WITH SURGERIES APRIL AND OCT 2013 AT [facility] SURGERIES WERE SHORT PROCEDURES--PROBLEMS WITH ANESTHESIA SEEM TO OCCUR WITH THE LONGER PROCEDURES.THE TREMORS WERE AFTER HIP REPLACEMNT 22011-ARMS WERE FLAILING--PT STATES IT TOOK 4 DOSES OF DEMEROL BEFORE THE TREMORS STOPPED.  . Bradycardia     symptomatic  . Pneumonia   . Cataracts, bilateral     Past Surgical History  Procedure Laterality Date  . Tonsillectomy and adenoidectomy    . Total hip arthroplasty      right  . Left shoulder arthroscopy     . Carpal tunnel release  05/31/2012    Procedure: CARPAL TUNNEL RELEASE;  Surgeon: Mcarthur Rossetti, MD;  Location: [facility];  Service: Orthopedics;  Laterality: Left;  Left Open Carpal Tunnel Release  . Cerebral aneurysm repair  10/2005    GUGLIEMI COILS TO REPAIR ANEURYSM; NO RESIDUAL PROBLEMS--HAS FOLLOW UP MRI EVERY 1 OR 2 YRS  . Tubal ligation      ANAPHYLACTIC SHOCK AFTER THIS SURGERY-CAUSE THOUGHT TO  BE RELATED TO SODIUM PENTOTHAL  . 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 Joint replacement  10/19/09    RT HIP--SEVERE TREMORS, FLAILING OF ARMS AFTER THIS SURGERY.  . Cholecystectomy      EXTREME DROP IN BLOOD PRESSURE WITH THIS SURGERY- YRS AGO- PT WAS 74 YRS OLD  .  Reverse shoulder arthroplasty  10/10/2012    Procedure: REVERSE SHOULDER ARTHROPLASTY;  Surgeon: Nita Sells, MD;  Location: [facility];  Service: Orthopedics;  Laterality: Left;  . Carpal tunnel release  10/10/2012    Procedure: CARPAL TUNNEL RELEASE;  Surgeon: Nita Sells, MD;  Location: [facility];  Service: Orthopedics;  Laterality: Left;    Prescriptions prior to admission  Medication Sig Dispense Refill Last Dose  . ALPRAZolam (XANAX) 0.5 MG tablet Take 0.5 mg by mouth every 6 (six) hours.    06/18/2015 at 0445  . Ascorbic Acid (VITAMIN C) 1000 MG tablet Take  1,000 mg by mouth every morning.    06/17/2015 at Unknown time  . Calcium Carbonate (CALTRATE 600 PO) Take 600 mg by mouth 2 (two) times daily.    06/17/2015 at Unknown time  . Cholecalciferol (VITAMIN D-3 PO) Take 1,000 mg by mouth every morning.    06/17/2015 at Unknown time  . FLUoxetine (PROZAC) 20 MG tablet Take 20 mg by mouth every morning.    06/18/2015 at 0445  . Fluticasone-Salmeterol (ADVAIR DISKUS) 250-50 MCG/DOSE AEPB Inhale 1 puff into the lungs every 12 (twelve) hours.    06/18/2015 at 0445  . Ibuprofen-Diphenhydramine Cit (ADVIL PM PO) Take 1 tablet by mouth at bedtime.    Past Week at Unknown time  . levothyroxine (SYNTHROID, LEVOTHROID) 75 MCG tablet Take 75 mcg by mouth every morning. MUST TAKE BRAND NAME SYNTHROID   06/17/2015 at Unknown time  . lisinopril-hydrochlorothiazide (PRINZIDE,ZESTORETIC) 20-12.5 MG per tablet Take 1 tablet by mouth every morning.    06/17/2015 at Unknown time  . montelukast (SINGULAIR) 10 MG tablet Take 10 mg by mouth at bedtime. MUST BE BRAND NAME ONLY-SINGULAIR   06/17/2015 at 2200  . Multiple Vitamin (MULITIVITAMIN WITH MINERALS) TABS Take 1 tablet by mouth daily.   06/17/2015 at Unknown time  . mycophenolate (CELLCEPT) 500 MG tablet Take 1,000 mg by mouth 2 (two) times daily.    06/17/2015 at 2200  . naproxen (NAPROSYN) 500 MG tablet Take 500 mg by mouth 2 (two) times daily with a meal.   Past Week at Unknown time  . Omega-3 Fatty Acids (SUPER TWIN EPA/DHA) 1250 MG CAPS Take 1 capsule by mouth daily.   Past Week at Unknown time  . oxyCODONE-acetaminophen (PERCOCET) 7.5-325 MG tablet Take 1 tablet by mouth every 6 (six) hours as needed for severe pain.   06/18/2015 at 0445  . Polyethyl Glycol-Propyl Glycol (SYSTANE OP) Apply 1 drop to eye every morning. To both eyes   06/18/2015 at Unknown time  . traZODone (DESYREL) 150 MG tablet Take 75 mg by mouth at bedtime.    06/17/2015 at 2200  . valACYclovir (VALTREX) 1000 MG tablet Take 1,000 mg by mouth  daily. 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 ONLY TAKES BRAND NAME VALTREX   06/17/2015 at 2200  . albuterol (PROVENTIL HFA;VENTOLIN HFA) 108 (90 BASE) MCG/ACT inhaler Inhale 2 puffs into the lungs every 6 (six) hours as needed.   More than a month at Unknown time   Allergies  Allergen Reactions  . Other Anaphylaxis    Sodium pentathol  . Aminoglycosides     Tobramycin, gentamycin, kanamycin, neomycin, streptomycin Can't take because of myasthenia gravis  . Avelox [Moxifloxacin Hcl In Nacl] Other (See Comments)    Can't take because of myasthenia gravis  . Beta Adrenergic Blockers     Proponolol, timolol maleate eyedrops Can't take because of myasthenia gravis  .  Calcium Channel Blockers     Blood pressure medications Can't take because of myasthenia gravis  . Cephalosporins     Can't take because of myasthenia gravis  . Ciprofloxacin     Can't take because of myasthenia gravis  . Clindamycin/Lincomycin     May exascerbate myasthenia gravis  . Colistin     Can't take because of myasthenia gravis  . Fosamax [Alendronate Sodium]     LEG WEAKNESS  . Imuran [Azathioprine Sodium] Other (See Comments)    Kidneys shut down  . Iodinated Diagnostic Agents   . Iodine     Iodine contrast Can't take because of myasthenia gravis  . Macrolides And Ketolides     Erythromocin, azithromycin, telithromycin Can't take because of myasthenia gravis  . Magnesium-Containing Compounds     Including milk of magnesia, antacids containing magnesium hydroxide (maalox, mylanta) and epsom salts Can't take because of myasthenia gravis  . Norfloxacin     Can't take because of myasthenia gravis  . Ofloxacin     Can't take because of myasthenia gravis  . Pefloxacin     Can't take because of myasthenia gravis  . Penicillamine     do not use due to Myasthenia Gravis  . Prednisolone Nausea And Vomiting  . Prednisone Nausea And Vomiting  . Procainamide     Can't  take because of myasthenia gravis  . Quinidine     Can't take because of myasthenia gravis  . Quinine Derivatives     Can't take because of myasthenia gravis  . Succinylcholine     Can't take because of myasthenia gravis No paralytics  . Tubocurarine     Can't take because of myasthenia gravis  . Vecuronium     Can't take because of myasthenia gravis No paralytics  . Vicodin [Hydrocodone-Acetaminophen] Other (See Comments)    Pt suffers from bad headaches    Social History  Substance Use Topics  . Smoking status: Never Smoker   . Smokeless tobacco: Never Used  . Alcohol Use: 0.0 oz/week     Comment: OCCASIONAL    Family History  Problem Relation Age of Onset  . Hypertension Mother   . Heart disease Mother     heart attack  . Stroke Father   . Heart disease Father     heart attack     Review of Systems Notable for those difficulties described inner history of present illness and past medical history, but otherwise unremarkable.  Objective: Vital signs in last 24 hours: Temp:  [97 F (36.1 C)-98.4 F (36.9 C)] 97 F (36.1 C) (10/14 0619) Pulse Rate:  [68-70] 68 (10/14 0619) Resp:  [20] 20 (10/14 0619) BP: (161-186)/(70-82) 186/82 mmHg (10/14 0619) SpO2:  [95 %-100 %] 100 % (10/14 0619) Weight:  [84.539 kg (186 lb 6 oz)-84.55 kg (186 lb 6.4 oz)] 84.539 kg (186 lb 6 oz) (10/14 4098)  EXAM: Patient well-developed well-nourished white female in no acute distress. Lungs are clear to auscultation , the patient has symmetrical respiratory excursion. Heart has a regular rate and rhythm normal S1 and S2 no murmur.   Abdomen is soft nontender nondistended bowel sounds are present. Extremity examination shows no clubbing cyanosis or edema. Motor examination shows 5 over 5 strength in the lower extremities including the iliopsoas quadriceps dorsiflexor extensor hallicus  longus and plantar flexor bilaterally. Sensation is intact to pinprick in the distal lower extremities. Reflexes  are symmetrical bilaterally. No pathologic reflexes are present. Patient has a normal gait and stance.  Data Review:CBC    Component Value Date/Time   WBC 7.2 06/17/2015 1404   RBC 4.39 06/17/2015 1404   HGB 14.7 06/17/2015 1404   HCT 42.0 06/17/2015 1404   PLT 345 06/17/2015 1404   MCV 95.7 06/17/2015 1404   MCH 33.5 06/17/2015 1404   MCHC 35.0 06/17/2015 1404   RDW 12.9 06/17/2015 1404   LYMPHSABS 2.1 10/04/2012 1355   MONOABS 0.5 10/04/2012 1355   EOSABS 0.1 10/04/2012 1355   BASOSABS 0.0 10/04/2012 1355                          BMET    Component Value Date/Time   NA 136 06/17/2015 1404   K 4.2 06/17/2015 1404   CL 100* 06/17/2015 1404   CO2 24 06/17/2015 1404   GLUCOSE 108* 06/17/2015 1404   BUN 7 06/17/2015 1404   CREATININE 0.78 06/17/2015 1404   CALCIUM 10.3 06/17/2015 1404   GFRNONAA >60 06/17/2015 1404   GFRAA >60 06/17/2015 1404     Assessment/Plan: Patient with severe disabling left lumbar radicular pain, secondary to left L4-5 lateral recess stenosis, associated with a grade 2 degenerative spondylolisthesis of L4-L5. Patient is admitted now for L4-5 lumbar decompression and stabilization.  I've discussed with the patient the nature of his condition, the nature the surgical procedure, the typical length of surgery, hospital stay, and overall recuperation, the limitations postoperatively, and risks of surgery. I discussed risks including risks of infection, bleeding, possibly need for transfusion, the risk of nerve root dysfunction with pain, weakness, numbness, or paresthesias, the risk of dural tear and CSF leakage and possible need for further surgery, the risk of failure of the arthrodesis and possibly for further surgery, the risk of anesthetic complications including myocardial infarction, stroke, pneumonia, and death. We discussed the need for postoperative immobilization in a lumbar brace. Understanding all this the patient does wish to proceed with surgery  and is admitted for such.     Hosie Spangle, MD 06/18/2015 7:16 AM

## 2015-06-18 NOTE — Op Note (Signed)
06/18/2015  11:27 AM  PATIENT:  Sabrina [name]  74 y.o. 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  PROCEDURE:  Procedure(s):  L4-5 lumbar decompression including bilateral L4 and L5 lumbar laminectomy, bilateral L4-5 facetectomy and foraminotomies for the exiting L4 and L5 nerve roots bilaterally, with resection of 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  ANESTHESIA:   general  EBL:  Total I/O In: 2100 [I.V.:1600; IV Piggyback:500] Out: 5631 [Urine:825; Blood:680]  BLOOD ADMINISTERED:180 CC CELLSAVER  COUNT: Correct per nursing staff  DICTATION: Patient is brought to the operating room placed under general endotracheal anesthesia. The patient was turned to prone position the lumbar region was prepped with Betadine soap and solution and draped in a sterile fashion. The midline was infiltrated with local anesthesia with epinephrine. A localizing x-ray was taken and then a midline incision was made carried down through the subcutaneous tissue, bipolar cautery and electrocautery were used to maintain hemostasis. Dissection was carried down to the lumbar fascia. The fascia was incised bilaterally and the paraspinal muscles were  dissected with a spinous process and lamina in a subperiosteal fashion. Another x-ray was taken for localization and the L4-5 level was localized. Dissection was then carried out laterally over the facet complex and the transverse processes of L4 and L5 were exposed and decorticated.  Decompression was begun via a bilateral L4 and L5 lumbar laminectomy, using double-action rongeurs, the high-speed drill, and Kerrison punches. Ligament flavum was found to be markedly thickened, and was carefully removed, decompressing the underlying thecal sac. Bilateral facetectomy was performed using the high-speed drill and Kerrison punches. Foraminotomies were performed for the exiting L4 and L5 nerve roots bilaterally. As we were unroofing the left L5 neural foramen, we found a synovial cyst partially adherent to the exiting left L5 nerve root. This was carefully freed up from the nerve root and removed in a piecemeal fashion. Once the decompression of the canal, lateral recess, and neural foraminal stenosis was completed we proceeded with the arthrodesis.  The annulus was incised bilaterally and the disc space entered. A thorough discectomy was performed using pituitary rongeurs and curettes. Once the discectomy was completed we began to prepare the endplate surfaces removing the cartilaginous endplates surface. We then measured the height of the intervertebral disc space. We selected 10 x 25 x 4 AVS peek interbody implants.  The C-arm fluoroscope was then draped and brought in the field and we identified the pedicle entry points bilaterally at the L4-L5 levels. Each of the 4 pedicles was probed, we aspirated bone marrow aspirate from the vertebral bodies, this was injected over a 10 cc strip and a 5 cc strip of Vitoss BA. Then each of the pedicles was examined with the ball probe good bony surfaces were found and no bony cuts were found. Each of the pedicles was then tapped with a 5.25 mm tap, again examined with the  ball probe good threading was found  and no bony cuts were found. We then placed 5.75 by 50 millimeter screws bilaterally at the L4 level and 5.75 x 45 mm screws bilaterally at the L5 level.  We then packed the AVS peek interbody implants with Vitoss BA with bone marrow aspirate and infuse, and then placed the first implant and on the right side, carefully retracting the thecal sac and nerve root medially. We then went back to the left side and packed the midline with additional Vitoss BA with bone marrow aspirate and infuse, and then placed a second implant and on the left side again retracting the thecal sac and nerve root medially. Additional Vitoss BA with bone marrow aspirate and infuse was packed lateral to the implants.  We then packed the lateral gutter over the transverse processes and intertransverse space with Vitoss BA with locally harvested morcellized autograft, bone marrow aspirate, and infuse. We then selected a 30 mm pre-lordosed rods, they were placed within the screw heads and secured with locking caps once all 4 locking caps were placed final tightening was performed against a counter torque.  The wound had been irrigated multiple times during the procedure with saline solution and bacitracin solution, good hemostasis was established with a combination of bipolar cautery, Gelfoam with thrombin, and Surgifoam. Once good hemostasis was confirmed we proceeded with closure. Vancomycin powder was sprinkled on the tissue surfaces. Then the paraspinal muscles deep fascia and Scarpa's fascia were closed with interrupted undyed 1 Vicryl sutures the subcutaneous and subcuticular closed with interrupted inverted 2-0 undyed Vicryl sutures the skin edges were approximated with Dermabond.  Following surgery the patient was turned back to the supine position to be reversed and the anesthetic extubated and transferred to the recovery room for further care.  PLAN OF CARE: Admit to inpatient   PATIENT  DISPOSITION:  PACU - hemodynamically stable.   Delay start of Pharmacological VTE agent (>24hrs) due to surgical blood loss or risk of bleeding:  yes

## 2015-06-18 NOTE — Anesthesia Postprocedure Evaluation (Signed)
  Anesthesia Post-op Note  Patient: Sabrina [name]  Procedure(s) Performed: Procedure(s) with comments: POSTERIOR LUMBAR FUSION 1 LEVEL (N/A) - L45 decompression with posterior lumbar interbody fusion with interbody prosthesis posterior lateral arthrodesis and posterior nonsegmental instrumentation  Patient Location: PACU  Anesthesia Type:General  Level of Consciousness: awake  Airway and Oxygen Therapy: Patient Spontanous Breathing  Post-op Pain: mild  Post-op Assessment: Post-op Vital signs reviewed              Post-op Vital Signs: Reviewed  Last Vitals:  Filed Vitals:   06/18/15 1137  BP: 150/78  Pulse: 84  Temp: 36.9 C  Resp: 16    Complications: No apparent anesthesia complications

## 2015-06-18 NOTE — Anesthesia Procedure Notes (Signed)
Procedure Name: Intubation Date/Time: 06/18/2015 7:29 AM Performed by: ,  Pre-anesthesia Checklist: Patient identified, Emergency Drugs available, Suction available and Patient being monitored Patient Re-evaluated:Patient Re-evaluated prior to inductionOxygen Delivery Method: Circle system utilized Preoxygenation: Pre-oxygenation with 100% oxygen Intubation Type: IV induction Ventilation: Mask ventilation without difficulty Laryngoscope Size: Miller and 3 Grade View: Grade I Tube type: Oral Tube size: 7.0 mm Number of attempts: 1 Airway Equipment and Method: Stylet,  Bite block and LTA kit utilized Placement Confirmation: ETT inserted through vocal cords under direct vision,  positive ETCO2 and breath sounds checked- equal and bilateral Secured at: 22 cm Tube secured with: Tape Dental Injury: Teeth and Oropharynx as per pre-operative assessment      

## 2015-06-18 NOTE — Progress Notes (Signed)
Utilization review completed.  

## 2015-06-18 NOTE — Transfer of Care (Signed)
Immediate Anesthesia Transfer of Care Note  Patient: Sabrina [name]  Procedure(s) Performed: Procedure(s) with comments: POSTERIOR LUMBAR FUSION 1 LEVEL (N/A) - L45 decompression with posterior lumbar interbody fusion with interbody prosthesis posterior lateral arthrodesis and posterior nonsegmental instrumentation  Patient Location: PACU  Anesthesia Type:General  Level of Consciousness: awake and alert   Airway & Oxygen Therapy: Patient Spontanous Breathing and Patient connected to face mask oxygen  Post-op Assessment: Report given to RN, Post -op Vital signs reviewed and stable and Patient moving all extremities  Post vital signs: Reviewed and stable  Last Vitals:  Filed Vitals:   06/18/15 1137  BP: 150/78  Pulse: 84  Temp: 36.9 C  Resp: 16    Complications: No apparent anesthesia complications

## 2015-06-18 NOTE — Progress Notes (Signed)
Filed Vitals:   06/18/15 1230 06/18/15 1245 06/18/15 1300 06/18/15 1704  BP: 160/96 159/79 169/73 151/84  Pulse: 68 75 72 76  Temp:  98.8 F (37.1 C) 98.1 F (36.7 C) 98.5 F (36.9 C)  TempSrc:      Resp: 14 20 16 16   Height:      Weight:      SpO2: 99% 99% 100% 98%    CBC  Recent Labs  06/17/15 1404  WBC 7.2  HGB 14.7  HCT 42.0  PLT 345   BMET  Recent Labs  06/17/15 1404  NA 136  K 4.2  CL 100*  CO2 24  GLUCOSE 108*  BUN 7  CREATININE 0.78  CALCIUM 10.3    Patient sitting on the edge of the bed. Has been up and ambulated a moderate distance in the hallway so far. Asked to have Foley removed. Only a small void so far. Nursing staff to monitor voiding function. Wound clean and dry.  Plan: We'll continue to progress through postoperative recovery.  Hosie Spangle, MD 06/18/2015, 5:41 PM

## 2015-06-19 MED ORDER — OXYCODONE-ACETAMINOPHEN 5-325 MG PO TABS: 1.0000 | ORAL_TABLET | ORAL | Status: DC | PRN

## 2015-06-19 NOTE — Progress Notes (Signed)
Subjective: Patient reports doing well  Objective: Vital signs in last 24 hours: Temp:  [98.1 F (36.7 C)-99.1 F (37.3 C)] 98.5 F (36.9 C) (10/15 0427) Pulse Rate:  [66-84] 70 (10/15 0427) Resp:  [14-20] 20 (10/15 0427) BP: (111-169)/(56-96) 111/61 mmHg (10/15 0427) SpO2:  [95 %-100 %] 95 % (10/15 0427)  Intake/Output from previous day: 10/14 0701 - 10/15 0700 In: 2820 [P.O.:720; I.V.:1600; IV Piggyback:500] Out: 2841 [Urine:2875; Blood:680] Intake/Output this shift: Total I/O In: -  Out: 400 [Urine:400]  Physical Exam: Full strength both lower extremities.  Dressing CDI  Lab Results:  Recent Labs  06/17/15 1404  WBC 7.2  HGB 14.7  HCT 42.0  PLT 345   BMET  Recent Labs  06/17/15 1404  NA 136  K 4.2  CL 100*  CO2 24  GLUCOSE 108*  BUN 7  CREATININE 0.78  CALCIUM 10.3    Studies/Results: Dg Lumbar Spine 2-3 Views  06/18/2015  CLINICAL DATA:  Status post posterior fusion of L4-5. EXAM: DG C-ARM 61-120 MIN; LUMBAR SPINE - 2-3 VIEW COMPARISON:  Same day. FLUOROSCOPY TIME:  35 seconds. FINDINGS: Two intraoperative fluoroscopic images were obtained of the lower lumbar spine. These demonstrate the patient be status post posterior fusion of L4-5 with bilateral intrapedicular screw placement and interbody fusion. Good alignment of the vertebral bodies is noted. IMPRESSION: Status post surgical posterior fusion of L4-5. Electronically Signed   By: Marijo Conception, M.D.   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 L4-5 PLIF. EXAM: LUMBAR SPINE - COMPLETE 4+ VIEW COMPARISON:  MRI lumbar spine 06/01/2015. That examination demonstrated 5 non rib-bearing lumbar vertebrae. FINDINGS: Five portable lateral images were obtained sequentially in the operating room and are submitted for  interpretation postoperatively. Initial image at 0758 hr demonstrates a localizer needle with its tip adjacent to the L4 spinous process. 2nd image at 0805 hr demonstrates a localizer probe posterior to the L4 vertebral body. 3rd image obtained at 0811 hr demonstrates tissue spreaders with the localizer probe still posterior to the L4 vertebral body. 4th image obtained at 0814 hr demonstrates a 2nd probe which is posterior to the upper endplate of L5. 5th image obtained at 0821 hr demonstrates a new localizer probe directed toward the L4-5 disc space. IMPRESSION: L4-5 localized intraoperatively. Electronically Signed   By: Evangeline Dakin M.D.   On: 06/18/2015 11:07   Dg C-arm 1-60 Min  06/18/2015  CLINICAL DATA:  Status post posterior fusion of L4-5. EXAM: DG C-ARM 61-120 MIN; LUMBAR SPINE - 2-3 VIEW COMPARISON:  Same day. FLUOROSCOPY TIME:  35 seconds. FINDINGS: Two intraoperative fluoroscopic images were obtained of the lower lumbar spine. These demonstrate the patient be status post posterior fusion of L4-5 with bilateral intrapedicular screw placement and interbody fusion. Good alignment of the vertebral bodies is noted. IMPRESSION: Status post surgical posterior fusion of L4-5. Electronically Signed   By: Marijo Conception, M.D.   On: 06/18/2015 11:20    Assessment/Plan: Doing well.  Discharge home.    LOS: 1 day    Peggyann Shoals, MD 06/19/2015, 6:42 AM

## 2015-06-19 NOTE — Discharge Summary (Signed)
Physician Discharge Summary  Patient ID: Sabrina [name] MRN: [id number] DOB/AGE: 1941-03-10 74 y.o.  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 rate 20, height 5' 5.5" (1.664 m), weight 84.539 kg (186 lb 6 oz), SpO2 95 %. Neurologic: Alert and oriented X 3, normal strength and tone. Normal symmetric reflexes. Normal coordination and gait Wound:CDI  Disposition: Home     Medication List    STOP taking these medications        ADVIL PM PO     naproxen 500 MG tablet  Commonly known as:  NAPROSYN      TAKE these medications        ADVAIR DISKUS 250-50 MCG/DOSE Aepb  Generic drug:  Fluticasone-Salmeterol  Inhale 1 puff into the lungs every 12 (twelve) hours.     albuterol 108 (90 BASE) MCG/ACT inhaler  Commonly known as:  PROVENTIL HFA;VENTOLIN HFA  Inhale 2 puffs into the lungs every 6 (six) hours as needed.     ALPRAZolam 0.5 MG tablet  Commonly known as:  XANAX  Take 0.5 mg by mouth every 6 (six) hours.     CALTRATE 600 PO  Take 600 mg by mouth 2 (two) times daily.     CELLCEPT 500 MG tablet  Generic drug:  mycophenolate  Take 1,000 mg by mouth 2 (two) times daily.     FLUoxetine 20 MG tablet  Commonly known as:  PROZAC  Take 20 mg by mouth every morning.     levothyroxine 75 MCG tablet  Commonly known as:  SYNTHROID, LEVOTHROID  Take 75 mcg by mouth every morning. MUST TAKE BRAND NAME SYNTHROID     lisinopril-hydrochlorothiazide 20-12.5 MG tablet  Commonly known  as:  PRINZIDE,ZESTORETIC  Take 1 tablet by mouth every morning.     montelukast 10 MG tablet  Commonly known as:  SINGULAIR  Take 10 mg by mouth at bedtime. MUST BE BRAND NAME ONLY-SINGULAIR     multivitamin with minerals Tabs tablet  Take 1 tablet by mouth daily.     oxyCODONE-acetaminophen 5-325 MG tablet  Commonly known as:  PERCOCET/ROXICET  Take 1-2 tablets by mouth every 4 (four) hours as needed for moderate pain.     oxyCODONE-acetaminophen 7.5-325 MG tablet  Commonly known as:  PERCOCET  Take 1 tablet by mouth every 6 (six) hours as needed for severe pain.     SUPER TWIN EPA/DHA 1250 MG Caps  Take 1 capsule by mouth daily.     SYSTANE OP  Apply 1 drop to eye every morning. To both eyes     traZODone 150 MG tablet  Commonly known as:  DESYREL  Take 75 mg by mouth at bedtime.     valACYclovir 1000 MG tablet  Commonly known as:  VALTREX  Take 1,000 mg by mouth daily. 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 ONLY TAKES BRAND NAME VALTREX     vitamin C 1000 MG tablet  Take 1,000 mg by mouth every morning.     VITAMIN D-3 PO  Take 1,000 mg  by mouth every morning.         Signed: Peggyann Shoals, MD 06/19/2015, 6:43 AM

## 2015-06-19 NOTE — Discharge Instructions (Signed)

## 2015-06-19 NOTE — Progress Notes (Signed)
Patient alert and oriented, mae's well, voiding adequate amount of urine, swallowing without difficulty, no c/o pain. Patient discharged home with family. Script and discharged instructions given to patient. Patient and family stated understanding of d/c instructions given and has an appointment with MD. 

## 2015-06-21 MED FILL — Sodium Chloride IV Soln 0.9%: INTRAVENOUS | Qty: 3000 | Status: AC

## 2015-06-21 MED FILL — Heparin Sodium (Porcine) Inj 1000 Unit/ML: INTRAMUSCULAR | Qty: 30 | Status: AC

## 2015-06-23 ENCOUNTER — Encounter (HOSPITAL_COMMUNITY): Payer: Self-pay | Admitting: Neurosurgery

## 2015-07-12 DIAGNOSIS — M5136 Other intervertebral disc degeneration, lumbar region: Secondary | ICD-10-CM | POA: Diagnosis not present

## 2015-07-12 DIAGNOSIS — M4726 Other spondylosis with radiculopathy, lumbar region: Secondary | ICD-10-CM | POA: Diagnosis not present

## 2015-07-12 DIAGNOSIS — I10 Essential (primary) hypertension: Secondary | ICD-10-CM | POA: Diagnosis not present

## 2015-07-12 DIAGNOSIS — Z981 Arthrodesis status: Secondary | ICD-10-CM | POA: Diagnosis not present

## 2015-07-12 DIAGNOSIS — Z6830 Body mass index (BMI) 30.0-30.9, adult: Secondary | ICD-10-CM | POA: Diagnosis not present

## 2015-07-12 DIAGNOSIS — M4806 Spinal stenosis, lumbar region: Secondary | ICD-10-CM | POA: Diagnosis not present

## 2015-07-12 DIAGNOSIS — M4316 Spondylolisthesis, lumbar region: Secondary | ICD-10-CM | POA: Diagnosis not present

## 2015-07-27 DIAGNOSIS — R030 Elevated blood-pressure reading, without diagnosis of hypertension: Secondary | ICD-10-CM | POA: Diagnosis not present

## 2015-07-27 DIAGNOSIS — R112 Nausea with vomiting, unspecified: Secondary | ICD-10-CM | POA: Diagnosis not present

## 2015-07-27 DIAGNOSIS — R197 Diarrhea, unspecified: Secondary | ICD-10-CM | POA: Diagnosis not present

## 2015-07-28 ENCOUNTER — Emergency Department (HOSPITAL_COMMUNITY)
Admission: EM | Admit: 2015-07-28 | Discharge: 2015-07-28 | Disposition: A | Discharge status: Home / Self Care | Payer: Medicare Other | Attending: Emergency Medicine | Admitting: Emergency Medicine

## 2015-07-28 ENCOUNTER — Emergency Department (HOSPITAL_COMMUNITY): Payer: Medicare Other

## 2015-07-28 ENCOUNTER — Encounter (HOSPITAL_COMMUNITY): Payer: Self-pay | Admitting: Emergency Medicine

## 2015-07-28 DIAGNOSIS — M810 Age-related osteoporosis without current pathological fracture: Secondary | ICD-10-CM | POA: Insufficient documentation

## 2015-07-28 DIAGNOSIS — Z79899 Other long term (current) drug therapy: Secondary | ICD-10-CM | POA: Insufficient documentation

## 2015-07-28 DIAGNOSIS — Z85828 Personal history of other malignant neoplasm of skin: Secondary | ICD-10-CM | POA: Diagnosis not present

## 2015-07-28 DIAGNOSIS — F1123 Opioid dependence with withdrawal: Secondary | ICD-10-CM | POA: Diagnosis not present

## 2015-07-28 DIAGNOSIS — F329 Major depressive disorder, single episode, unspecified: Secondary | ICD-10-CM | POA: Insufficient documentation

## 2015-07-28 DIAGNOSIS — F419 Anxiety disorder, unspecified: Secondary | ICD-10-CM | POA: Diagnosis not present

## 2015-07-28 DIAGNOSIS — Z8701 Personal history of pneumonia (recurrent): Secondary | ICD-10-CM | POA: Diagnosis not present

## 2015-07-28 DIAGNOSIS — Z7951 Long term (current) use of inhaled steroids: Secondary | ICD-10-CM | POA: Diagnosis not present

## 2015-07-28 DIAGNOSIS — H269 Unspecified cataract: Secondary | ICD-10-CM | POA: Insufficient documentation

## 2015-07-28 DIAGNOSIS — R112 Nausea with vomiting, unspecified: Secondary | ICD-10-CM | POA: Diagnosis present

## 2015-07-28 DIAGNOSIS — R197 Diarrhea, unspecified: Secondary | ICD-10-CM | POA: Diagnosis not present

## 2015-07-28 DIAGNOSIS — I10 Essential (primary) hypertension: Secondary | ICD-10-CM | POA: Diagnosis not present

## 2015-07-28 DIAGNOSIS — F1193 Opioid use, unspecified with withdrawal: Secondary | ICD-10-CM

## 2015-07-28 DIAGNOSIS — M1990 Unspecified osteoarthritis, unspecified site: Secondary | ICD-10-CM | POA: Diagnosis not present

## 2015-07-28 DIAGNOSIS — E039 Hypothyroidism, unspecified: Secondary | ICD-10-CM | POA: Insufficient documentation

## 2015-07-28 DIAGNOSIS — E785 Hyperlipidemia, unspecified: Secondary | ICD-10-CM | POA: Diagnosis not present

## 2015-07-28 DIAGNOSIS — E876 Hypokalemia: Secondary | ICD-10-CM | POA: Diagnosis not present

## 2015-07-28 DIAGNOSIS — R140 Abdominal distension (gaseous): Secondary | ICD-10-CM | POA: Diagnosis not present

## 2015-07-28 DIAGNOSIS — J45909 Unspecified asthma, uncomplicated: Secondary | ICD-10-CM | POA: Insufficient documentation

## 2015-07-28 LAB — COMPREHENSIVE METABOLIC PANEL
ALK PHOS: 82 U/L (ref 38–126)
ALT: 30 U/L (ref 14–54)
AST: 32 U/L (ref 15–41)
Albumin: 4.2 g/dL (ref 3.5–5.0)
Anion gap: 12 (ref 5–15)
BILIRUBIN TOTAL: 0.9 mg/dL (ref 0.3–1.2)
BUN: 6 mg/dL (ref 6–20)
CHLORIDE: 96 mmol/L — AB (ref 101–111)
CO2: 21 mmol/L — ABNORMAL LOW (ref 22–32)
CREATININE: 0.76 mg/dL (ref 0.44–1.00)
Calcium: 9.3 mg/dL (ref 8.9–10.3)
GFR calc Af Amer: >60 mL/min (ref >60)
Glucose, Bld: 114 mg/dL — ABNORMAL HIGH (ref 65–99)
Potassium: 2.8 mmol/L — ABNORMAL LOW (ref 3.5–5.1)
Sodium: 129 mmol/L — ABNORMAL LOW (ref 135–145)
Total Protein: 6.5 g/dL (ref 6.5–8.1)

## 2015-07-28 LAB — LIPASE, BLOOD: Lipase: 22 U/L (ref 11–51)

## 2015-07-28 LAB — CBC
HCT: 40.2 % (ref 36.0–46.0)
Hemoglobin: 14.3 g/dL (ref 12.0–15.0)
MCH: 33.1 pg (ref 26.0–34.0)
MCHC: 35.6 g/dL (ref 30.0–36.0)
MCV: 93.1 fL (ref 78.0–100.0)
PLATELETS: 383 10*3/uL (ref 150–400)
RBC: 4.32 MIL/uL (ref 3.87–5.11)
RDW: 12.8 % (ref 11.5–15.5)
WBC: 8.4 10*3/uL (ref 4.0–10.5)

## 2015-07-28 MED ORDER — SODIUM CHLORIDE 0.9 % IV BOLUS (SEPSIS)
1000.0000 mL | Freq: Once | INTRAVENOUS | Status: AC
  Administered 2015-07-28: 1000 mL via INTRAVENOUS

## 2015-07-28 MED ORDER — POTASSIUM CHLORIDE CRYS ER 20 MEQ PO TBCR
80.0000 meq | EXTENDED_RELEASE_TABLET | Freq: Once | ORAL | Status: AC
  Administered 2015-07-28: 80 meq via ORAL
  Filled 2015-07-28: qty 4

## 2015-07-28 MED ORDER — ONDANSETRON 8 MG PO TBDP: ORAL_TABLET | ORAL | Status: DC

## 2015-07-28 MED ORDER — POTASSIUM CHLORIDE ER 10 MEQ PO TBCR: 20.0000 meq | EXTENDED_RELEASE_TABLET | Freq: Two times a day (BID) | ORAL | Status: DC

## 2015-07-28 MED ORDER — DICYCLOMINE HCL 10 MG/ML IM SOLN
20.0000 mg | Freq: Once | INTRAMUSCULAR | Status: AC
  Administered 2015-07-28: 20 mg via INTRAMUSCULAR
  Filled 2015-07-28: qty 2

## 2015-07-28 NOTE — ED Notes (Signed)
Pt tried to give a urine specimen but it was contaminated with stool.

## 2015-07-28 NOTE — ED Notes (Signed)
Pt presents via EMS from home c/o ongoing nausea, vomiting, and diarrhea since Friday 11/18.  Pt denies blood in stool.  Pt has taken OTC meds but has been unable to keep them down so no relief thus far.  Poor tolerance of PO intake.  Pt thinks it may be related to a back surgery five weeks ago that resulted in Rx for percocet, recently began weaning from meds.  Hx of myasthenia gravis.  20g IV in left AC, 4mg  Zofran en route.  Vitals en route 130/96, 74HR, NSR with PVCs on EKG, 98% room air.

## 2015-07-28 NOTE — Discharge Instructions (Signed)
Hypokalemia °Hypokalemia means that the amount of potassium in the blood is lower than normal. Potassium is a chemical, called an electrolyte, that helps regulate the amount of fluid in the body. It also stimulates muscle contraction and helps nerves function properly. Most of the body's potassium is inside of cells, and only a very small amount is in the blood. Because the amount in the blood is so small, minor changes can be life-threatening. °CAUSES °· Antibiotics. °· Diarrhea or vomiting. °· Using laxatives too much, which can cause diarrhea. °· Chronic kidney disease. °· Water pills (diuretics). °· Eating disorders (bulimia). °· Low magnesium level. °· Sweating a lot. °SIGNS AND SYMPTOMS °· Weakness. °· Constipation. °· Fatigue. °· Muscle cramps. °· Mental confusion. °· Skipped heartbeats or irregular heartbeat (palpitations). °· Tingling or numbness. °DIAGNOSIS  °Your health care provider can diagnose hypokalemia with blood tests. In addition to checking your potassium level, your health care provider may also check other lab tests. °TREATMENT °Hypokalemia can be treated with potassium supplements taken by mouth or adjustments in your current medicines. If your potassium level is very low, you may need to get potassium through a vein (IV) and be monitored in the hospital. A diet high in potassium is also helpful. Foods high in potassium are: °· Nuts, such as peanuts and pistachios. °· Seeds, such as sunflower seeds and pumpkin seeds. °· Peas, lentils, and lima beans. °· Whole grain and bran cereals and breads. °· Fresh fruit and vegetables, such as apricots, avocado, bananas, cantaloupe, kiwi, oranges, tomatoes, asparagus, and potatoes. °· Orange and tomato juices. °· Red meats. °· Fruit yogurt. °HOME CARE INSTRUCTIONS °· Take all medicines as prescribed by your health care provider. °· Maintain a healthy diet by including nutritious food, such as fruits, vegetables, nuts, whole grains, and lean meats. °· If  you are taking a laxative, be sure to follow the directions on the label. °SEEK MEDICAL CARE IF: °· Your weakness gets worse. °· You feel your heart pounding or racing. °· You are vomiting or having diarrhea. °· You are diabetic and having trouble keeping your blood glucose in the normal range. °SEEK IMMEDIATE MEDICAL CARE IF: °· You have chest pain, shortness of breath, or dizziness. °· You are vomiting or having diarrhea for more than 2 days. °· You faint. °MAKE SURE YOU:  °· Understand these instructions. °· Will watch your condition. °· Will get help right away if you are not doing well or get worse. °  °This information is not intended to replace advice given to you by your health care provider. Make sure you discuss any questions you have with your health care provider. °  °Document Released: 08/21/2005 Document Revised: 09/11/2014 Document Reviewed: 02/21/2013 °Elsevier Interactive Patient Education ©2016 [employer] ° °Potassium Content of Foods °Potassium is a mineral found in many foods and drinks. It helps keep fluids and minerals balanced in your body and affects how steadily your heart beats. Potassium also helps control your blood pressure and keep your muscles and nervous system healthy. °Certain health conditions and medicines may change the balance of potassium in your body. When this happens, you can help balance your level of potassium through the foods that you do or do not eat. Your health care provider or dietitian may recommend an amount of potassium that you should have each day. The following lists of foods provide the amount of potassium (in parentheses) per serving in each item. °HIGH IN POTASSIUM  °The following foods and beverages have 200 mg   or more of potassium per serving:  Apricots, 2 raw or 5 dry (200 mg).  Artichoke, 1 medium (345 mg).  Avocado, raw,  each (245 mg).  Banana, 1 medium (425 mg).  Beans, lima, or baked beans, canned,  cup (280 mg).  Beans, white,  canned,  cup (595 mg).  Beef roast, 3 oz (320 mg).  Beef, ground, 3 oz (270 mg).  Beets, raw or cooked,  cup (260 mg).  Bran muffin, 2 oz (300 mg).  Broccoli,  cup (230 mg).  Brussels sprouts,  cup (250 mg).  Cantaloupe,  cup (215 mg).  Cereal, 100% bran,  cup (200-400 mg).  Cheeseburger, single, fast food, 1 each (225-400 mg).  Chicken, 3 oz (220 mg).  Clams, canned, 3 oz (535 mg).  Crab, 3 oz (225 mg).  Dates, 5 each (270 mg).  Dried beans and peas,  cup (300-475 mg).  Figs, dried, 2 each (260 mg).  Fish: halibut, tuna, cod, snapper, 3 oz (480 mg).  Fish: salmon, haddock, swordfish, perch, 3 oz (300 mg).  Fish, tuna, canned 3 oz (200 mg).  [place] fries, fast food, 3 oz (470 mg).  Granola with fruit and nuts,  cup (200 mg).  Grapefruit juice,  cup (200 mg).  Greens, beet,  cup (655 mg).  Honeydew melon,  cup (200 mg).  Kale, raw, 1 cup (300 mg).  Kiwi, 1 medium (240 mg).  Kohlrabi, rutabaga, parsnips,  cup (280 mg).  Lentils,  cup (365 mg).  Mango, 1 each (325 mg).  Milk, chocolate, 1 cup (420 mg).  Milk: nonfat, low-fat, whole, buttermilk, 1 cup (350-380 mg).  Molasses, 1 Tbsp (295 mg).  Mushrooms,  cup (280) mg.  Nectarine, 1 each (275 mg).  Nuts: almonds, peanuts, hazelnuts, [place], cashew, mixed, 1 oz (200 mg).  Nuts, pistachios, 1 oz (295 mg).  Orange, 1 each (240 mg).  Orange juice,  cup (235 mg).  Papaya, medium,  fruit (390 mg).  Peanut butter, chunky, 2 Tbsp (240 mg).  Peanut butter, smooth, 2 Tbsp (210 mg).  Pear, 1 medium (200 mg).  Pomegranate, 1 whole (400 mg).  Pomegranate juice,  cup (215 mg).  Pork, 3 oz (350 mg).  Potato chips, salted, 1 oz (465 mg).  Potato, baked with skin, 1 medium (925 mg).  Potatoes, boiled,  cup (255 mg).  Potatoes, mashed,  cup (330 mg).  Prune juice,  cup (370 mg).  Prunes, 5 each (305 mg).  Pudding, chocolate,  cup (230 mg).  Pumpkin, canned,  cup  (250 mg).  Raisins, seedless,  cup (270 mg).  Seeds, sunflower or pumpkin, 1 oz (240 mg).  Soy milk, 1 cup (300 mg).  Spinach,  cup (420 mg).  Spinach, canned,  cup (370 mg).  Sweet potato, baked with skin, 1 medium (450 mg).  Swiss chard,  cup (480 mg).  Tomato or vegetable juice,  cup (275 mg).  Tomato sauce or puree,  cup (400-550 mg).  Tomato, raw, 1 medium (290 mg).  Tomatoes, canned,  cup (200-300 mg).  [place], 3 oz (250 mg).  Wheat germ, 1 oz (250 mg).  Winter squash,  cup (250 mg).  Yogurt, plain or fruited, 6 oz (260-435 mg).  Zucchini,  cup (220 mg). MODERATE IN POTASSIUM The following foods and beverages have 50-200 mg of potassium per serving:  Apple, 1 each (150 mg).  Apple juice,  cup (150 mg).  Applesauce,  cup (90 mg).  Apricot nectar,  cup (140 mg).  Asparagus, small  spears, ½ cup or 6 spears (155 mg). °· Bagel, cinnamon raisin, 1 each (130 mg). °· Bagel, egg or plain, 4 in., 1 each (70 mg). °· Beans, green, ½ cup (90 mg). °· Beans, yellow, ½ cup (190 mg). °· Beer, regular, 12 oz (100 mg). °· Beets, canned, ½ cup (125 mg). °· Blackberries, ½ cup (115 mg). °· Blueberries, ½ cup (60 mg). °· Bread, whole wheat, 1 slice (70 mg). °· Broccoli, raw, ½ cup (145 mg). °· Cabbage, ½ cup (150 mg). °· Carrots, cooked or raw, ½ cup (180 mg). °· Cauliflower, raw, ½ cup (150 mg). °· Celery, raw, ½ cup (155 mg). °· Cereal, bran flakes, ½cup (120-150 mg). °· Cheese, cottage, ½ cup (110 mg). °· Cherries, 10 each (150 mg). °· Chocolate, 1½ oz bar (165 mg). °· Coffee, brewed 6 oz (90 mg). °· Corn, ½ cup or 1 ear (195 mg). °· Cucumbers, ½ cup (80 mg). °· Egg, large, 1 each (60 mg). °· Eggplant, ½ cup (60 mg). °· Endive, raw, ½cup (80 mg). °· English muffin, 1 each (65 mg). °· Fish, orange roughy, 3 oz (150 mg). °· Frankfurter, beef or pork, 1 each (75 mg). °· Fruit cocktail, ½ cup (115 mg). °· Grape juice, ½ cup (170 mg). °· Grapefruit, ½ fruit (175 mg). °· Grapes,  ½ cup (155 mg). °· Greens: kale, turnip, collard, ½ cup (110-150 mg). °· Ice cream or frozen yogurt, chocolate, ½ cup (175 mg). °· Ice cream or frozen yogurt, vanilla, ½ cup (120-150 mg). °· Lemons, limes, 1 each (80 mg). °· Lettuce, all types, 1 cup (100 mg). °· Mixed vegetables, ½ cup (150 mg). °· Mushrooms, raw, ½ cup (110 mg). °· Nuts: walnuts, pecans, or macadamia, 1 oz (125 mg). °· Oatmeal, ½ cup (80 mg). °· [place], ½ cup (110 mg). °· Onions, raw, ½ cup (120 mg). °· Peach, 1 each (185 mg). °· Peaches, canned, ½ cup (120 mg). °· Pears, canned, ½ cup (120 mg). °· Peas, green, frozen, ½ cup (90 mg). °· Peppers, green, ½ cup (130 mg). °· Peppers, red, ½ cup (160 mg). °· Pineapple juice, ½ cup (165 mg). °· Pineapple, fresh or canned, ½ cup (100 mg). °· Plums, 1 each (105 mg). °· Pudding, vanilla, ½ cup (150 mg). °· Raspberries, ½ cup (90 mg). °· Rhubarb, ½ cup (115 mg). °· Rice, wild, ½ cup (80 mg). °· Shrimp, 3 oz (155 mg). °· Spinach, raw, 1 cup (170 mg). °· Strawberries, ½ cup (125 mg). °· Summer squash ½ cup (175-200 mg). °· Swiss chard, raw, 1 cup (135 mg). °· Tangerines, 1 each (140 mg). °· Tea, brewed, 6 oz (65 mg). °· Turnips, ½ cup (140 mg). °· Watermelon, ½ cup (85 mg). °· Wine, red, table, 5 oz (180 mg). °· Wine, white, table, 5 oz (100 mg). °LOW IN POTASSIUM °The following foods and beverages have less than 50 mg of potassium per serving. °· Bread, white, 1 slice (30 mg). °· Carbonated beverages, 12 oz (less than 5 mg). °· Cheese, 1 oz (20-30 mg). °· Cranberries, ½ cup (45 mg). °· Cranberry juice cocktail, ½ cup (20 mg). °· Fats and oils, 1 Tbsp (less than 5 mg). °· Hummus, 1 Tbsp (32 mg). °· Nectar: papaya, mango, or pear, ½ cup (35 mg). °· Rice, white or brown, ½ cup (50 mg). °· Spaghetti or macaroni, ½ cup cooked (30 mg). °· Tortilla, flour or corn, 1 each (50 mg). °· Waffle, 4 in., 1 each (50 mg). °· Water   chestnuts,  cup (40 mg).   This information is not intended to replace advice given to you by  your health care provider. Make sure you discuss any questions you have with your health care provider.   Document Released: 04/04/2005 Document Revised: 08/26/2013 Document Reviewed: 07/18/2013 Elsevier Interactive Patient Education 2016 [place].  Opioid Withdrawal Opioids are a group of narcotic drugs. They include the street drug heroin. They also include pain medicines, such as morphine, hydrocodone, oxycodone, and fentanyl. Opioid withdrawal is a group of characteristic physical and mental signs and symptoms. It typically occurs if you have been using opioids daily for several weeks or longer and stop using or rapidly decrease use. Opioid withdrawal can also occur if you have used opioids daily for a long time and are given a medicine to block the effect.  SIGNS AND SYMPTOMS Opioid withdrawal includes three or more of the following symptoms:   Depressed, anxious, or irritable mood.  Nausea or vomiting.  Muscle aches or spasms.   Watery eyes.   Runny nose.  Dilated pupils, sweating, or hairs standing on end.  Diarrhea or intestinal cramping.  Yawning.   Fever.  Increased blood pressure.  Fast pulse.  Restlessness or trouble sleeping. These signs and symptoms occur within several hours of stopping or reducing short-acting opioids, such as heroin. They can occur within 3 days of stopping or reducing long-acting opioids, such as methadone. Withdrawal begins within minutes of receiving a drug that blocks the effects of opioids, such as naltrexone or naloxone. DIAGNOSIS  Opioid use disorder is diagnosed by your health care provider. You will be asked about your symptoms, drug and alcohol use, medical history, and use of medicines. A physical exam may be done. Lab tests may be ordered. Your health care provider may have you see a mental health professional.  TREATMENT  The treatment for opioid withdrawal is usually provided by medical doctors with special training in  substance use disorders (addiction specialists). The following medicines may be included in treatment:  Opioids given in place of the abused opioid. They turn on opioid receptors in the brain and lessen or prevent withdrawal symptoms. They are gradually decreased (opioid substitution and taper).  Non-opioids that can lessen certain opioid withdrawal symptoms. They may be used alone or with opioid substitution and taper. Successful long-term recovery usually requires medicine, counseling, and group support. HOME CARE INSTRUCTIONS   Take medicines only as directed by your health care provider.  Check with your health care provider before starting new medicines.  Keep all follow-up visits as directed by your health care provider. SEEK MEDICAL CARE IF:  You are not able to take your medicines as directed.  Your symptoms get worse.  You relapse. SEEK IMMEDIATE MEDICAL CARE IF:  You have serious thoughts about hurting yourself or others.  You have a seizure.  You lose consciousness.   This information is not intended to replace advice given to you by your health care provider. Make sure you discuss any questions you have with your health care provider.   Document Released: 08/24/2003 Document Revised: 09/11/2014 Document Reviewed: 09/03/2013 Elsevier Interactive Patient Education [employer].

## 2015-07-28 NOTE — ED Notes (Signed)
Pt states that she thinks she has had diarrhea "at least 50 times" within the past 24 hours.  Intermittent cramping abd pain after BM, otherwise no pain at this time.  4 episodes of vomiting in the last day, nausea relieved by zofran en route via EMS.

## 2015-07-28 NOTE — ED Provider Notes (Signed)
CSN: [id number]     Arrival date & time 07/28/15  0030 History   By signing my name below, I, Sabrina [name], attest that this documentation has been prepared under the direction and in the presence of Sabrina Hendricks, MD.  Electronically Signed: Forrestine [name], ED Scribe. 07/28/2015. 2:41 AM.   Chief Complaint  Patient presents with  . Emesis  . Diarrhea   Patient is a 74 y.o. female presenting with diarrhea. The history is provided by the patient. No language interpreter was used.  Diarrhea Quality:  Unable to specify Severity:  Severe Onset quality:  Gradual Duration:  9 days Timing:  Intermittent Progression:  Unchanged Relieved by:  Nothing Worsened by:  Nothing tried Ineffective treatments:  None tried Associated symptoms: vomiting   Associated symptoms: no chills, no recent cough, no diaphoresis, no fever and no headaches   Risk factors: no suspicious food intake and no travel to endemic areas     HPI Comments: Sabrina [name] brought in by EMS is a 74 y.o. female who presents to the Emergency Department complaining of constant, ongoing diarrhea and nausea x 9 days. "50 episodes" of diarrhea reported x 1 day. No blood noted in stools. Ongoing intermittent abdominal cramping after having bowel movements reported. 4 episodes of vomiting also reported in last day. No aggravating or alleviating factors at this time. OTC medications attempted prior to arrival, however, pt is having diffuculty tolerating foods, liquids, or pills. 4 mg of Zofran given en route to department with some improvement. No recent fever, chills, chest pain, or shortness of breath. [name] recently underwent back surgery 5 weeks ago and was discharged with a prescription for Percocet 10 mg every 4 hours and recently started weaning off of this medication at 5 mg. Prior to surgery pt was also started on a 4 week course of Percocet 7.5 mg.  PCP: Sabrina Redwood, MD    Past Medical History  Diagnosis Date  . Asthma    . Thyroid disease   . Arthritis   . History of skin cancer   . Hypothyroidism   . Anxiety   . Depression   . Osteoporosis   . Hyperlipidemia   . Myasthenia gravis     DOING WELL ON CELLCEPT-FOLLOWED BY DR. LISA HOBSON - [name]  . Hypertension     PT HAS LOST WEIGHT--B/P NOW WITHIN NORMALS--IS ON B/P MEDICINE  . Cancer ([place])     COUPLE OF SKIN CANCERS  . Pain     LEFT SHOULDER PAIN-TORN ROTATOR CUFF-VERY LIMITED ROM  . Carpal tunnel syndrome     LEFT --NUMBNESS  . Pain     TOP OF RIGHT SHOULDER--ROM OK AT PRESENT  . Rash, skin     LOWER ABDOMEN-FUNGAL INFECTION-PT HAS TOPICAL OINTMENT TO USE AS NEEDED.  Marland Kitchen Complication of anesthesia     BP FALLS/"ANAPHYLACTIC SHOCK"/ EXTREME TREMORS;  DID  FINE WITH SURGERIES Sabrina [name] AND OCT 2013 AT [facility] SURGERIES WERE SHORT PROCEDURES--PROBLEMS WITH ANESTHESIA SEEM TO OCCUR WITH THE LONGER PROCEDURES.THE TREMORS WERE AFTER HIP REPLACEMNT 22011-ARMS WERE FLAILING--PT STATES IT TOOK 4 DOSES OF DEMEROL BEFORE THE TREMORS STOPPED.  . Bradycardia     symptomatic  . Pneumonia   . Cataracts, bilateral    Past Surgical History  Procedure Laterality Date  . Tonsillectomy and adenoidectomy    . Total hip arthroplasty      right  . Left shoulder arthroscopy     . Carpal tunnel release  05/31/2012  Procedure: CARPAL TUNNEL RELEASE;  Surgeon: Sabrina Rossetti, MD;  Location: [facility];  Service: Orthopedics;  Laterality: Left;  Left Open Carpal Tunnel Release  . Cerebral aneurysm repair  10/2005    GUGLIEMI COILS TO REPAIR ANEURYSM; NO RESIDUAL PROBLEMS--HAS FOLLOW UP MRI EVERY 1 OR 2 YRS  . Tubal ligation      ANAPHYLACTIC SHOCK AFTER THIS SURGERY-CAUSE THOUGHT TO  BE RELATED TO SODIUM PENTOTHAL  . 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 Joint replacement  10/19/09    RT HIP--SEVERE TREMORS, FLAILING OF ARMS AFTER THIS SURGERY.  . Cholecystectomy      EXTREME DROP IN BLOOD PRESSURE WITH THIS SURGERY- YRS AGO- PT  WAS 74 YRS OLD  . Reverse shoulder arthroplasty  10/10/2012    Procedure: REVERSE SHOULDER ARTHROPLASTY;  Surgeon: Sabrina Sells, MD;  Location: [facility];  Service: Orthopedics;  Laterality: Left;  . Carpal tunnel release  10/10/2012    Procedure: CARPAL TUNNEL RELEASE;  Surgeon: Sabrina Sells, MD;  Location: [facility];  Service: Orthopedics;  Laterality: Left;   Family History  Problem Relation Age of Onset  . Hypertension Mother   . Heart disease Mother     heart attack  . Stroke Father   . Heart disease Father     heart attack   Social History  Substance Use Topics  . Smoking status: Never Smoker   . Smokeless tobacco: Never Used  . Alcohol Use: 0.0 oz/week     Comment: OCCASIONAL   OB History    No data available     Review of Systems  Constitutional: Negative for fever, chills and diaphoresis.  Respiratory: Negative for cough and shortness of breath.   Cardiovascular: Negative for chest pain and leg swelling.  Gastrointestinal: Positive for vomiting and diarrhea.  Genitourinary: Negative for dysuria.  Musculoskeletal: Negative for back pain.  Skin: Negative for rash.  Neurological: Negative for weakness, numbness and headaches.  Psychiatric/Behavioral: Negative for confusion.  All other systems reviewed and are negative.     Allergies  Other; Aminoglycosides; Avelox; Beta adrenergic blockers; Calcium channel blockers; Cephalosporins; Ciprofloxacin; Clindamycin/lincomycin; Colistin; Fosamax; Imuran; Iodinated diagnostic agents; Iodine; Macrolides and ketolides; Magnesium-containing compounds; Norfloxacin; Ofloxacin; Pefloxacin; Penicillamine; Prednisolone; Prednisone; Procainamide; Quinidine; Quinine derivatives; Succinylcholine; Tubocurarine; Vecuronium; and Vicodin  Home Medications   Prior to Admission medications   Medication Sig Start Date End Date Taking? Authorizing Provider  albuterol (PROVENTIL HFA;VENTOLIN HFA) 108 (90 BASE) MCG/ACT inhaler  Inhale 2 puffs into the lungs every 6 (six) hours as needed.    Historical Provider, MD  ALPRAZolam Duanne Moron) 0.5 MG tablet Take 0.5 mg by mouth every 6 (six) hours.     Historical Provider, MD  Ascorbic Acid (VITAMIN C) 1000 MG tablet Take 1,000 mg by mouth every morning.     Historical Provider, MD  Calcium Carbonate (CALTRATE 600 PO) Take 600 mg by mouth 2 (two) times daily.     Historical Provider, MD  Cholecalciferol (VITAMIN D-3 PO) Take 1,000 mg by mouth every morning.     Historical Provider, MD  FLUoxetine (PROZAC) 20 MG tablet Take 20 mg by mouth every morning.     Historical Provider, MD  Fluticasone-Salmeterol (ADVAIR DISKUS) 250-50 MCG/DOSE AEPB Inhale 1 puff into the lungs every 12 (twelve) hours.     Historical Provider, MD  levothyroxine (SYNTHROID, LEVOTHROID) 75 MCG tablet Take 75 mcg by mouth every morning. MUST TAKE BRAND NAME SYNTHROID    Historical Provider, MD  lisinopril-hydrochlorothiazide (PRINZIDE,ZESTORETIC) 20-12.5 MG per tablet Take 1 tablet by mouth every morning.     Historical Provider, MD  montelukast (SINGULAIR) 10 MG tablet Take 10 mg by mouth at bedtime. MUST BE BRAND NAME ONLY-SINGULAIR    Historical Provider, MD  Multiple Vitamin (MULITIVITAMIN WITH MINERALS) TABS Take 1 tablet by mouth daily.    Historical Provider, MD  mycophenolate (CELLCEPT) 500 MG tablet Take 1,000 mg by mouth 2 (two) times daily.     Historical Provider, MD  Omega-3 Fatty Acids (SUPER TWIN EPA/DHA) 1250 MG CAPS Take 1 capsule by mouth daily.    Historical Provider, MD  oxyCODONE-acetaminophen (PERCOCET) 7.5-325 MG tablet Take 1 tablet by mouth every 6 (six) hours as needed for severe pain.    Historical Provider, MD  oxyCODONE-acetaminophen (PERCOCET/ROXICET) 5-325 MG tablet Take 1-2 tablets by mouth every 4 (four) hours as needed for moderate pain. 06/19/15   Erline Levine, MD  Polyethyl Glycol-Propyl Glycol (SYSTANE OP) Apply 1 drop to eye every morning. To both eyes    Historical Provider,  MD  traZODone (DESYREL) 150 MG tablet Take 75 mg by mouth at bedtime.     Historical Provider, MD  valACYclovir (VALTREX) 1000 MG tablet Take 1,000 mg by mouth daily. 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 ONLY TAKES BRAND NAME VALTREX    Historical Provider, MD   Triage Vitals: BP 145/80 mmHg  Pulse 76  Temp(Src) 97.7 F (36.5 C) (Oral)  Resp 18  SpO2 98%   Physical Exam  Constitutional: She is oriented to person, place, and time. She appears well-developed and well-nourished. No distress.  HENT:  Head: Normocephalic and atraumatic.  Mouth/Throat: Oropharynx is clear and moist. No oropharyngeal exudate.  Eyes: EOM are normal. Pupils are equal, round, and reactive to light.  Neck: Normal range of motion.  Cardiovascular: Normal rate, regular rhythm and normal heart sounds.   Pulmonary/Chest: Effort normal and breath sounds normal. No respiratory distress. She has no wheezes. She has no rales.  Abdominal: Soft. She exhibits no distension. There is no tenderness. There is no rebound and no guarding.  Gas noted in abdomen  Musculoskeletal: Normal range of motion.  Neurological: She is alert and oriented to person, place, and time. She has normal reflexes.  Skin: Skin is warm and dry.  Psychiatric: She has a normal mood and affect. Judgment normal.  Nursing note and vitals reviewed.   ED Course  Procedures (including critical care time)  DIAGNOSTIC STUDIES: Oxygen Saturation is 98% on RA, Normal by my interpretation.    COORDINATION OF CARE: 2:22 AM- Will give fluids, Potassium, and Bentyl. Will order DG abd acute with chest, Lipase, CMP, CBC, and urinalysis. Discussed treatment plan with pt at bedside and pt agreed to plan.     2:50 AM- Spoke with Dr. Tamala Julian. Reccommended a Magnesium draw. If low, could be cause of symptoms. If elevated, pt will be admitted by hospitalist.  Labs Review Labs Reviewed  COMPREHENSIVE METABOLIC  PANEL - Abnormal; Notable for the following:    Sodium 129 (*)    Potassium 2.8 (*)    Chloride 96 (*)    CO2 21 (*)    Glucose, Bld 114 (*)    All other components within normal limits  LIPASE, BLOOD  CBC  URINALYSIS, ROUTINE W REFLEX MICROSCOPIC (NOT AT [facility])    Imaging Review No results found. I have personally reviewed and evaluated these images and lab results as part of  my medical decision-making.   EKG Interpretation None      MDM   Final diagnoses:  None    Results for orders placed or performed during the hospital encounter of 07/28/15  Lipase, blood  Result Value Ref Range   Lipase 22 11 - 51 U/L  Comprehensive metabolic panel  Result Value Ref Range   Sodium 129 (L) 135 - 145 mmol/L   Potassium 2.8 (L) 3.5 - 5.1 mmol/L   Chloride 96 (L) 101 - 111 mmol/L   CO2 21 (L) 22 - 32 mmol/L   Glucose, Bld 114 (H) 65 - 99 mg/dL   BUN 6 6 - 20 mg/dL   Creatinine, Ser 0.76 0.44 - 1.00 mg/dL   Calcium 9.3 8.9 - 10.3 mg/dL   Total Protein 6.5 6.5 - 8.1 g/dL   Albumin 4.2 3.5 - 5.0 g/dL   AST 32 15 - 41 U/L   ALT 30 14 - 54 U/L   Alkaline Phosphatase 82 38 - 126 U/L   Total Bilirubin 0.9 0.3 - 1.2 mg/dL   GFR calc non Af Amer >60 >60 mL/min   GFR calc Af Amer >60 >60 mL/min   Anion gap 12 5 - 15  CBC  Result Value Ref Range   WBC 8.4 4.0 - 10.5 K/uL   RBC 4.32 3.87 - 5.11 MIL/uL   Hemoglobin 14.3 12.0 - 15.0 g/dL   HCT 40.2 36.0 - 46.0 %   MCV 93.1 78.0 - 100.0 fL   MCH 33.1 26.0 - 34.0 pg   MCHC 35.6 30.0 - 36.0 g/dL   RDW 12.8 11.5 - 15.5 %   Platelets 383 150 - 400 K/uL   Dg Abd Acute W/chest  07/28/2015  CLINICAL DATA:  Diarrhea for 1 week, worse this morning. Abdominal distention and pain. EXAM: DG ABDOMEN ACUTE W/ 1V CHEST COMPARISON:  Chest 12/21/2011.  Abdomen 10/19/2009 FINDINGS: Normal heart size and pulmonary vascularity. No focal airspace disease or consolidation in the lungs. No blunting of costophrenic angles. No pneumothorax. Mediastinal  contours appear intact. Tortuous aorta. Degenerative changes in the spine and right shoulder. Postoperative change in the left shoulder. Gas-filled small and large bowel without significant distention likely due to ileus or enteritis. No free intra-abdominal air. No abnormal air-fluid levels. Postoperative changes in the lower lumbar spine and right hip. No radiopaque stones. Surgical clips in the right upper quadrant. IMPRESSION: No evidence of active pulmonary disease. Gas-filled nondistended small and large bowel most likely to represent ileus or enteritis. Electronically Signed   By: Lucienne Capers M.D.   On: 07/28/2015 03:16    \ Medications  sodium chloride 0.9 % bolus 1,000 mL (0 mLs Intravenous Stopped 07/28/15 0440)  potassium chloride SA (K-DUR,KLOR-CON) CR tablet 80 mEq (80 mEq Oral Given 07/28/15 0249)  dicyclomine (BENTYL) injection 20 mg (20 mg Intramuscular Given 07/28/15 0250)    Tolerated PO in the department.  Script for zofran ODT.  Recommend probiotics and bland diet.  PO potassium and recheck labs  with your doctor in 2 days.  Contact your physician about narcotic weaning.  Return for  Recurrent vomiting diarrhea weakness or any concerns  I personally performed the services described in this documentation, which was scribed in my presence. The recorded information has been reviewed and is accurate.       Veatrice Kells, MD 07/28/15 [id number]

## 2015-07-28 NOTE — ED Notes (Signed)
Bed: WA04 Expected date:  Expected time:  Means of arrival:  Comments: EMS 

## 2015-08-19 DIAGNOSIS — M545 Low back pain: Secondary | ICD-10-CM | POA: Diagnosis not present

## 2015-08-19 DIAGNOSIS — Z6829 Body mass index (BMI) 29.0-29.9, adult: Secondary | ICD-10-CM | POA: Diagnosis not present

## 2015-08-19 DIAGNOSIS — M4726 Other spondylosis with radiculopathy, lumbar region: Secondary | ICD-10-CM | POA: Diagnosis not present

## 2015-08-19 DIAGNOSIS — Z981 Arthrodesis status: Secondary | ICD-10-CM | POA: Diagnosis not present

## 2015-08-19 DIAGNOSIS — M4316 Spondylolisthesis, lumbar region: Secondary | ICD-10-CM | POA: Diagnosis not present

## 2015-08-19 DIAGNOSIS — M5136 Other intervertebral disc degeneration, lumbar region: Secondary | ICD-10-CM | POA: Diagnosis not present

## 2015-08-26 DIAGNOSIS — R05 Cough: Secondary | ICD-10-CM | POA: Diagnosis not present

## 2015-08-26 DIAGNOSIS — G7000 Myasthenia gravis without (acute) exacerbation: Secondary | ICD-10-CM | POA: Diagnosis not present

## 2015-08-26 DIAGNOSIS — J45909 Unspecified asthma, uncomplicated: Secondary | ICD-10-CM | POA: Diagnosis not present

## 2015-08-26 DIAGNOSIS — Z6830 Body mass index (BMI) 30.0-30.9, adult: Secondary | ICD-10-CM | POA: Diagnosis not present

## 2015-10-19 DIAGNOSIS — G7000 Myasthenia gravis without (acute) exacerbation: Secondary | ICD-10-CM | POA: Diagnosis not present

## 2015-10-22 ENCOUNTER — Other Ambulatory Visit: Payer: Self-pay

## 2015-10-22 DIAGNOSIS — Z1231 Encounter for screening mammogram for malignant neoplasm of breast: Secondary | ICD-10-CM

## 2015-10-25 DIAGNOSIS — L570 Actinic keratosis: Secondary | ICD-10-CM | POA: Diagnosis not present

## 2015-10-25 DIAGNOSIS — L821 Other seborrheic keratosis: Secondary | ICD-10-CM | POA: Diagnosis not present

## 2015-10-25 DIAGNOSIS — Z85828 Personal history of other malignant neoplasm of skin: Secondary | ICD-10-CM | POA: Diagnosis not present

## 2015-10-25 DIAGNOSIS — L814 Other melanin hyperpigmentation: Secondary | ICD-10-CM | POA: Diagnosis not present

## 2015-10-25 DIAGNOSIS — D1801 Hemangioma of skin and subcutaneous tissue: Secondary | ICD-10-CM | POA: Diagnosis not present

## 2015-10-25 DIAGNOSIS — H61001 Unspecified perichondritis of right external ear: Secondary | ICD-10-CM | POA: Diagnosis not present

## 2015-10-25 DIAGNOSIS — D225 Melanocytic nevi of trunk: Secondary | ICD-10-CM | POA: Diagnosis not present

## 2015-10-25 DIAGNOSIS — D485 Neoplasm of uncertain behavior of skin: Secondary | ICD-10-CM | POA: Diagnosis not present

## 2015-10-26 DIAGNOSIS — M4726 Other spondylosis with radiculopathy, lumbar region: Secondary | ICD-10-CM | POA: Diagnosis not present

## 2015-10-26 DIAGNOSIS — M4316 Spondylolisthesis, lumbar region: Secondary | ICD-10-CM | POA: Diagnosis not present

## 2015-10-26 DIAGNOSIS — Z981 Arthrodesis status: Secondary | ICD-10-CM | POA: Diagnosis not present

## 2015-10-26 DIAGNOSIS — M5136 Other intervertebral disc degeneration, lumbar region: Secondary | ICD-10-CM | POA: Diagnosis not present

## 2015-10-26 DIAGNOSIS — Z6828 Body mass index (BMI) 28.0-28.9, adult: Secondary | ICD-10-CM | POA: Diagnosis not present

## 2015-11-03 DIAGNOSIS — E782 Mixed hyperlipidemia: Secondary | ICD-10-CM | POA: Diagnosis not present

## 2015-11-03 DIAGNOSIS — M810 Age-related osteoporosis without current pathological fracture: Secondary | ICD-10-CM | POA: Diagnosis not present

## 2015-11-03 DIAGNOSIS — E038 Other specified hypothyroidism: Secondary | ICD-10-CM | POA: Diagnosis not present

## 2015-11-03 DIAGNOSIS — R8290 Unspecified abnormal findings in urine: Secondary | ICD-10-CM | POA: Diagnosis not present

## 2015-11-03 DIAGNOSIS — I10 Essential (primary) hypertension: Secondary | ICD-10-CM | POA: Diagnosis not present

## 2015-11-03 DIAGNOSIS — N390 Urinary tract infection, site not specified: Secondary | ICD-10-CM | POA: Diagnosis not present

## 2015-11-10 DIAGNOSIS — I10 Essential (primary) hypertension: Secondary | ICD-10-CM | POA: Diagnosis not present

## 2015-11-10 DIAGNOSIS — E038 Other specified hypothyroidism: Secondary | ICD-10-CM | POA: Diagnosis not present

## 2015-11-10 DIAGNOSIS — E782 Mixed hyperlipidemia: Secondary | ICD-10-CM | POA: Diagnosis not present

## 2015-11-10 DIAGNOSIS — E668 Other obesity: Secondary | ICD-10-CM | POA: Diagnosis not present

## 2015-11-10 DIAGNOSIS — J45909 Unspecified asthma, uncomplicated: Secondary | ICD-10-CM | POA: Diagnosis not present

## 2015-11-10 DIAGNOSIS — G7000 Myasthenia gravis without (acute) exacerbation: Secondary | ICD-10-CM | POA: Diagnosis not present

## 2015-11-10 DIAGNOSIS — F329 Major depressive disorder, single episode, unspecified: Secondary | ICD-10-CM | POA: Diagnosis not present

## 2015-11-10 DIAGNOSIS — Z6830 Body mass index (BMI) 30.0-30.9, adult: Secondary | ICD-10-CM | POA: Diagnosis not present

## 2015-11-10 DIAGNOSIS — R05 Cough: Secondary | ICD-10-CM | POA: Diagnosis not present

## 2015-11-10 DIAGNOSIS — Z1389 Encounter for screening for other disorder: Secondary | ICD-10-CM | POA: Diagnosis not present

## 2015-11-10 DIAGNOSIS — Z0000 Encounter for general adult medical examination without abnormal findings: Secondary | ICD-10-CM | POA: Diagnosis not present

## 2015-11-12 DIAGNOSIS — Z1212 Encounter for screening for malignant neoplasm of rectum: Secondary | ICD-10-CM | POA: Diagnosis not present

## 2015-12-14 ENCOUNTER — Ambulatory Visit
Admission: RE | Admit: 2015-12-14 | Discharge: 2015-12-14 | Disposition: A | Discharge status: Home / Self Care | Payer: Medicare Other | Source: Ambulatory Visit

## 2015-12-14 DIAGNOSIS — Z1231 Encounter for screening mammogram for malignant neoplasm of breast: Secondary | ICD-10-CM

## 2015-12-21 DIAGNOSIS — H5203 Hypermetropia, bilateral: Secondary | ICD-10-CM | POA: Diagnosis not present

## 2015-12-21 DIAGNOSIS — H2513 Age-related nuclear cataract, bilateral: Secondary | ICD-10-CM | POA: Diagnosis not present

## 2016-01-06 DIAGNOSIS — H25812 Combined forms of age-related cataract, left eye: Secondary | ICD-10-CM | POA: Diagnosis not present

## 2016-01-06 DIAGNOSIS — H268 Other specified cataract: Secondary | ICD-10-CM | POA: Diagnosis not present

## 2016-01-06 DIAGNOSIS — H2512 Age-related nuclear cataract, left eye: Secondary | ICD-10-CM | POA: Diagnosis not present

## 2016-02-03 DIAGNOSIS — H25811 Combined forms of age-related cataract, right eye: Secondary | ICD-10-CM | POA: Diagnosis not present

## 2016-02-03 DIAGNOSIS — H2511 Age-related nuclear cataract, right eye: Secondary | ICD-10-CM | POA: Diagnosis not present

## 2016-03-08 DIAGNOSIS — J45909 Unspecified asthma, uncomplicated: Secondary | ICD-10-CM | POA: Diagnosis not present

## 2016-03-08 DIAGNOSIS — G7000 Myasthenia gravis without (acute) exacerbation: Secondary | ICD-10-CM | POA: Diagnosis not present

## 2016-03-08 DIAGNOSIS — J209 Acute bronchitis, unspecified: Secondary | ICD-10-CM | POA: Diagnosis not present

## 2016-03-08 DIAGNOSIS — Z6831 Body mass index (BMI) 31.0-31.9, adult: Secondary | ICD-10-CM | POA: Diagnosis not present

## 2016-03-08 DIAGNOSIS — R05 Cough: Secondary | ICD-10-CM | POA: Diagnosis not present

## 2016-03-29 DIAGNOSIS — J301 Allergic rhinitis due to pollen: Secondary | ICD-10-CM | POA: Diagnosis not present

## 2016-03-29 DIAGNOSIS — R05 Cough: Secondary | ICD-10-CM | POA: Diagnosis not present

## 2016-03-29 DIAGNOSIS — Z6831 Body mass index (BMI) 31.0-31.9, adult: Secondary | ICD-10-CM | POA: Diagnosis not present

## 2016-03-29 DIAGNOSIS — J45909 Unspecified asthma, uncomplicated: Secondary | ICD-10-CM | POA: Diagnosis not present

## 2016-04-25 DIAGNOSIS — M4726 Other spondylosis with radiculopathy, lumbar region: Secondary | ICD-10-CM | POA: Diagnosis not present

## 2016-04-25 DIAGNOSIS — Z981 Arthrodesis status: Secondary | ICD-10-CM | POA: Diagnosis not present

## 2016-04-25 DIAGNOSIS — Z6830 Body mass index (BMI) 30.0-30.9, adult: Secondary | ICD-10-CM | POA: Diagnosis not present

## 2016-04-25 DIAGNOSIS — M5136 Other intervertebral disc degeneration, lumbar region: Secondary | ICD-10-CM | POA: Diagnosis not present

## 2016-05-12 DIAGNOSIS — H61001 Unspecified perichondritis of right external ear: Secondary | ICD-10-CM | POA: Diagnosis not present

## 2016-05-12 DIAGNOSIS — L821 Other seborrheic keratosis: Secondary | ICD-10-CM | POA: Diagnosis not present

## 2016-05-12 DIAGNOSIS — Z85828 Personal history of other malignant neoplasm of skin: Secondary | ICD-10-CM | POA: Diagnosis not present

## 2016-05-18 DIAGNOSIS — R05 Cough: Secondary | ICD-10-CM | POA: Diagnosis not present

## 2016-05-18 DIAGNOSIS — I10 Essential (primary) hypertension: Secondary | ICD-10-CM | POA: Diagnosis not present

## 2016-05-18 DIAGNOSIS — J3089 Other allergic rhinitis: Secondary | ICD-10-CM | POA: Diagnosis not present

## 2016-05-18 DIAGNOSIS — Z6831 Body mass index (BMI) 31.0-31.9, adult: Secondary | ICD-10-CM | POA: Diagnosis not present

## 2016-05-18 DIAGNOSIS — J45909 Unspecified asthma, uncomplicated: Secondary | ICD-10-CM | POA: Diagnosis not present

## 2016-05-18 DIAGNOSIS — Z23 Encounter for immunization: Secondary | ICD-10-CM | POA: Diagnosis not present

## 2016-07-13 DIAGNOSIS — R05 Cough: Secondary | ICD-10-CM | POA: Diagnosis not present

## 2016-07-13 DIAGNOSIS — Z6832 Body mass index (BMI) 32.0-32.9, adult: Secondary | ICD-10-CM | POA: Diagnosis not present

## 2016-07-13 DIAGNOSIS — B370 Candidal stomatitis: Secondary | ICD-10-CM | POA: Diagnosis not present

## 2016-07-13 DIAGNOSIS — J45909 Unspecified asthma, uncomplicated: Secondary | ICD-10-CM | POA: Diagnosis not present

## 2016-09-28 DIAGNOSIS — R05 Cough: Secondary | ICD-10-CM | POA: Diagnosis not present

## 2016-09-28 DIAGNOSIS — J45901 Unspecified asthma with (acute) exacerbation: Secondary | ICD-10-CM | POA: Diagnosis not present

## 2016-11-06 DIAGNOSIS — M810 Age-related osteoporosis without current pathological fracture: Secondary | ICD-10-CM | POA: Diagnosis not present

## 2016-11-06 DIAGNOSIS — I10 Essential (primary) hypertension: Secondary | ICD-10-CM | POA: Diagnosis not present

## 2016-11-06 DIAGNOSIS — E038 Other specified hypothyroidism: Secondary | ICD-10-CM | POA: Diagnosis not present

## 2016-11-06 DIAGNOSIS — E782 Mixed hyperlipidemia: Secondary | ICD-10-CM | POA: Diagnosis not present

## 2016-11-07 ENCOUNTER — Other Ambulatory Visit: Payer: Self-pay | Admitting: Internal Medicine

## 2016-11-07 DIAGNOSIS — Z1231 Encounter for screening mammogram for malignant neoplasm of breast: Secondary | ICD-10-CM

## 2016-11-09 DIAGNOSIS — G7000 Myasthenia gravis without (acute) exacerbation: Secondary | ICD-10-CM | POA: Diagnosis not present

## 2016-11-16 DIAGNOSIS — Z1389 Encounter for screening for other disorder: Secondary | ICD-10-CM | POA: Diagnosis not present

## 2016-11-16 DIAGNOSIS — Z6832 Body mass index (BMI) 32.0-32.9, adult: Secondary | ICD-10-CM | POA: Diagnosis not present

## 2016-11-16 DIAGNOSIS — E668 Other obesity: Secondary | ICD-10-CM | POA: Diagnosis not present

## 2016-11-16 DIAGNOSIS — Z0000 Encounter for general adult medical examination without abnormal findings: Secondary | ICD-10-CM | POA: Diagnosis not present

## 2016-11-16 DIAGNOSIS — I10 Essential (primary) hypertension: Secondary | ICD-10-CM | POA: Diagnosis not present

## 2016-11-16 DIAGNOSIS — K432 Incisional hernia without obstruction or gangrene: Secondary | ICD-10-CM | POA: Diagnosis not present

## 2016-11-16 DIAGNOSIS — E782 Mixed hyperlipidemia: Secondary | ICD-10-CM | POA: Diagnosis not present

## 2016-11-16 DIAGNOSIS — G7000 Myasthenia gravis without (acute) exacerbation: Secondary | ICD-10-CM | POA: Diagnosis not present

## 2016-11-16 DIAGNOSIS — J45909 Unspecified asthma, uncomplicated: Secondary | ICD-10-CM | POA: Diagnosis not present

## 2016-11-16 DIAGNOSIS — M810 Age-related osteoporosis without current pathological fracture: Secondary | ICD-10-CM | POA: Diagnosis not present

## 2016-11-16 DIAGNOSIS — E038 Other specified hypothyroidism: Secondary | ICD-10-CM | POA: Diagnosis not present

## 2016-12-12 DIAGNOSIS — K430 Incisional hernia with obstruction, without gangrene: Secondary | ICD-10-CM | POA: Diagnosis not present

## 2016-12-15 ENCOUNTER — Ambulatory Visit
Admission: RE | Admit: 2016-12-15 | Discharge: 2016-12-15 | Disposition: A | Discharge status: Home / Self Care | Payer: Medicare Other | Source: Ambulatory Visit | Attending: Internal Medicine | Admitting: Internal Medicine

## 2016-12-15 DIAGNOSIS — Z1231 Encounter for screening mammogram for malignant neoplasm of breast: Secondary | ICD-10-CM

## 2016-12-26 DIAGNOSIS — L821 Other seborrheic keratosis: Secondary | ICD-10-CM | POA: Diagnosis not present

## 2016-12-26 DIAGNOSIS — L814 Other melanin hyperpigmentation: Secondary | ICD-10-CM | POA: Diagnosis not present

## 2016-12-26 DIAGNOSIS — D225 Melanocytic nevi of trunk: Secondary | ICD-10-CM | POA: Diagnosis not present

## 2016-12-26 DIAGNOSIS — D2272 Melanocytic nevi of left lower limb, including hip: Secondary | ICD-10-CM | POA: Diagnosis not present

## 2016-12-26 DIAGNOSIS — Z85828 Personal history of other malignant neoplasm of skin: Secondary | ICD-10-CM | POA: Diagnosis not present

## 2016-12-26 DIAGNOSIS — D1809 Hemangioma of other sites: Secondary | ICD-10-CM | POA: Diagnosis not present

## 2016-12-26 DIAGNOSIS — D0472 Carcinoma in situ of skin of left lower limb, including hip: Secondary | ICD-10-CM | POA: Diagnosis not present

## 2016-12-26 DIAGNOSIS — L578 Other skin changes due to chronic exposure to nonionizing radiation: Secondary | ICD-10-CM | POA: Diagnosis not present

## 2016-12-26 DIAGNOSIS — L304 Erythema intertrigo: Secondary | ICD-10-CM | POA: Diagnosis not present

## 2016-12-26 DIAGNOSIS — D485 Neoplasm of uncertain behavior of skin: Secondary | ICD-10-CM | POA: Diagnosis not present

## 2016-12-29 DIAGNOSIS — L578 Other skin changes due to chronic exposure to nonionizing radiation: Secondary | ICD-10-CM | POA: Diagnosis not present

## 2016-12-29 DIAGNOSIS — D0472 Carcinoma in situ of skin of left lower limb, including hip: Secondary | ICD-10-CM | POA: Diagnosis not present

## 2016-12-29 DIAGNOSIS — L304 Erythema intertrigo: Secondary | ICD-10-CM | POA: Diagnosis not present

## 2016-12-29 DIAGNOSIS — L821 Other seborrheic keratosis: Secondary | ICD-10-CM | POA: Diagnosis not present

## 2016-12-29 DIAGNOSIS — L814 Other melanin hyperpigmentation: Secondary | ICD-10-CM | POA: Diagnosis not present

## 2016-12-29 DIAGNOSIS — D485 Neoplasm of uncertain behavior of skin: Secondary | ICD-10-CM | POA: Diagnosis not present

## 2016-12-29 DIAGNOSIS — D2272 Melanocytic nevi of left lower limb, including hip: Secondary | ICD-10-CM | POA: Diagnosis not present

## 2016-12-29 DIAGNOSIS — Z85828 Personal history of other malignant neoplasm of skin: Secondary | ICD-10-CM | POA: Diagnosis not present

## 2016-12-29 DIAGNOSIS — D225 Melanocytic nevi of trunk: Secondary | ICD-10-CM | POA: Diagnosis not present

## 2017-01-15 DIAGNOSIS — J309 Allergic rhinitis, unspecified: Secondary | ICD-10-CM | POA: Diagnosis not present

## 2017-01-15 DIAGNOSIS — Z6832 Body mass index (BMI) 32.0-32.9, adult: Secondary | ICD-10-CM | POA: Diagnosis not present

## 2017-01-15 DIAGNOSIS — J45901 Unspecified asthma with (acute) exacerbation: Secondary | ICD-10-CM | POA: Diagnosis not present

## 2017-01-15 DIAGNOSIS — I10 Essential (primary) hypertension: Secondary | ICD-10-CM | POA: Diagnosis not present

## 2017-04-04 DIAGNOSIS — B370 Candidal stomatitis: Secondary | ICD-10-CM | POA: Diagnosis not present

## 2017-04-04 DIAGNOSIS — Z85828 Personal history of other malignant neoplasm of skin: Secondary | ICD-10-CM | POA: Diagnosis not present

## 2017-06-21 DIAGNOSIS — Z23 Encounter for immunization: Secondary | ICD-10-CM | POA: Diagnosis not present

## 2017-10-23 ENCOUNTER — Encounter (HOSPITAL_COMMUNITY): Payer: Self-pay

## 2017-10-23 ENCOUNTER — Other Ambulatory Visit: Payer: Self-pay

## 2017-10-23 ENCOUNTER — Emergency Department (HOSPITAL_COMMUNITY): Payer: Medicare Other

## 2017-10-23 ENCOUNTER — Emergency Department (HOSPITAL_COMMUNITY)
Admission: EM | Admit: 2017-10-23 | Discharge: 2017-10-23 | Disposition: A | Discharge status: Home / Self Care | Payer: Medicare Other | Attending: Emergency Medicine | Admitting: Emergency Medicine

## 2017-10-23 DIAGNOSIS — Z79899 Other long term (current) drug therapy: Secondary | ICD-10-CM | POA: Insufficient documentation

## 2017-10-23 DIAGNOSIS — J45909 Unspecified asthma, uncomplicated: Secondary | ICD-10-CM | POA: Diagnosis not present

## 2017-10-23 DIAGNOSIS — S8990XA Unspecified injury of unspecified lower leg, initial encounter: Secondary | ICD-10-CM | POA: Diagnosis not present

## 2017-10-23 DIAGNOSIS — Z85828 Personal history of other malignant neoplasm of skin: Secondary | ICD-10-CM | POA: Insufficient documentation

## 2017-10-23 DIAGNOSIS — I10 Essential (primary) hypertension: Secondary | ICD-10-CM | POA: Insufficient documentation

## 2017-10-23 DIAGNOSIS — G8911 Acute pain due to trauma: Secondary | ICD-10-CM | POA: Diagnosis not present

## 2017-10-23 DIAGNOSIS — M791 Myalgia, unspecified site: Secondary | ICD-10-CM | POA: Diagnosis not present

## 2017-10-23 DIAGNOSIS — E039 Hypothyroidism, unspecified: Secondary | ICD-10-CM | POA: Diagnosis not present

## 2017-10-23 DIAGNOSIS — M25562 Pain in left knee: Secondary | ICD-10-CM | POA: Diagnosis present

## 2017-10-23 DIAGNOSIS — R600 Localized edema: Secondary | ICD-10-CM | POA: Insufficient documentation

## 2017-10-23 MED ORDER — ACETAMINOPHEN 500 MG PO TABS
1000.0000 mg | ORAL_TABLET | Freq: Once | ORAL | Status: AC
  Administered 2017-10-23: 1000 mg via ORAL
  Filled 2017-10-23: qty 2

## 2017-10-23 MED ORDER — OXYCODONE HCL 5 MG PO TABS: 2.5000 mg | ORAL_TABLET | ORAL | 0 refills | Status: DC | PRN

## 2017-10-23 NOTE — ED Provider Notes (Signed)
[facility] DEPT Provider Note   CSN: [id number] Arrival date & time: 10/23/17  1033     History   Chief Complaint Chief Complaint  Patient presents with  . Knee Pain    HPI Sabrina [name] is a 77 y.o. female.  76 yo F with a chief complaint of left knee pain.  The patient was reaching to grab something and twisted to the left when she felt a pop to the posterior aspect of the left knee.  She had sudden severe pain that made her sit on the bed.  Since then she has had trouble ranging of the left knee.  Difficulty bearing weight due to pain.  She normally walks with a walker at home.  He has had a prior right hip replacement done by Dr. Ninfa Linden.  Denies fevers, nausea vomiting or diarrhea, erythema or direct trauma.   The history is provided by the patient.  Knee Pain   This is a new problem. The current episode started yesterday. The problem occurs constantly. The problem has not changed since onset.The pain is present in the left knee. The quality of the pain is described as aching and sharp. The pain is at a severity of 8/10. The pain is moderate. She has tried nothing for the symptoms. The treatment provided no relief. There has been no history of extremity trauma.    Past Medical History:  Diagnosis Date  . Anxiety   . Arthritis   . Asthma   . Bradycardia    symptomatic  . Cancer ([place])    COUPLE OF SKIN CANCERS  . Carpal tunnel syndrome    LEFT --NUMBNESS  . Cataracts, bilateral   . Complication of anesthesia    BP FALLS/"ANAPHYLACTIC SHOCK"/ EXTREME TREMORS;  DID  FINE WITH SURGERIES APRIL AND OCT 2013 AT [facility] SURGERIES WERE SHORT PROCEDURES--PROBLEMS WITH ANESTHESIA SEEM TO OCCUR WITH THE LONGER PROCEDURES.THE TREMORS WERE AFTER HIP REPLACEMNT 22011-ARMS WERE FLAILING--PT STATES IT TOOK 4 DOSES OF DEMEROL BEFORE THE TREMORS STOPPED.  . Depression   . History of skin cancer   . Hyperlipidemia   . Hypertension    PT HAS LOST  WEIGHT--B/P NOW WITHIN NORMALS--IS ON B/P MEDICINE  . Hypothyroidism   . Myasthenia gravis    DOING WELL ON CELLCEPT-FOLLOWED BY DR. LISA HOBSON - [facility] NEUROLOGIST  . Osteoporosis   . Pain    LEFT SHOULDER PAIN-TORN ROTATOR CUFF-VERY LIMITED ROM  . Pain    TOP OF RIGHT SHOULDER--ROM OK AT PRESENT  . Pneumonia   . Rash, skin    LOWER ABDOMEN-FUNGAL INFECTION-PT HAS TOPICAL OINTMENT TO USE AS NEEDED.  Marland Kitchen Thyroid disease     Patient Active Problem List   Diagnosis Date Noted  . Lumbar stenosis 06/18/2015  . Essential hypertension 02/20/2014  . Bradycardia 02/20/2014  . Swelling of left lower extremity 02/20/2014  . Rotator cuff arthropathy 10/16/2012  . Carpal tunnel syndrome 10/16/2012  . Carpal tunnel syndrome of right wrist 05/31/2012  . Rotator cuff impingement syndrome 12/22/2011  . Irreducible incisional hernia 03/15/2011    Past Surgical History:  Procedure Laterality Date  . BELPHAROPTOSIS REPAIR  01/2006   PTOSIS REPAIR BILATERAL  . CARPAL TUNNEL RELEASE  05/31/2012   Procedure: CARPAL TUNNEL RELEASE;  Surgeon: Mcarthur Rossetti, MD;  Location: [facility];  Service: Orthopedics;  Laterality: Left;  Left Open Carpal Tunnel Release  . CARPAL TUNNEL RELEASE  10/10/2012   Procedure: CARPAL TUNNEL RELEASE;  Surgeon: Nita Sells,  MD;  Location: [facility];  Service: Orthopedics;  Laterality: Left;  . 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 CHOLECYSTECTOMY     EXTREME DROP IN BLOOD PRESSURE WITH THIS SURGERY- YRS AGO- PT WAS 77 YRS OLD  . JOINT REPLACEMENT  10/19/09   RT HIP--SEVERE TREMORS, FLAILING OF ARMS AFTER THIS SURGERY.  Marland Kitchen left shoulder arthroscopy     . REVERSE SHOULDER ARTHROPLASTY  10/10/2012   Procedure: REVERSE SHOULDER ARTHROPLASTY;  Surgeon: Nita Sells, MD;  Location: [facility];  Service: Orthopedics;  Laterality: Left;  . TONSILLECTOMY AND ADENOIDECTOMY      . TOTAL HIP ARTHROPLASTY     right  . TUBAL LIGATION     ANAPHYLACTIC SHOCK AFTER THIS SURGERY-CAUSE THOUGHT TO  BE RELATED TO SODIUM PENTOTHAL    OB History    No data available       Home Medications    Prior to Admission medications   Medication Sig Start Date End Date Taking? Authorizing Provider  ALPRAZolam Duanne Moron) 0.5 MG tablet Take 0.5 mg by mouth every 6 (six) hours.    Yes [provider]  Ascorbic Acid (VITAMIN C) 1000 MG tablet Take 1,000 mg by mouth every morning.    Yes [provider]  Cholecalciferol (VITAMIN D-3 PO) Take 1,000 mg by mouth every morning.    Yes [provider]  FLUoxetine (PROZAC) 20 MG tablet Take 20 mg by mouth every morning.    Yes [provider]  fluticasone (FLONASE) 50 MCG/ACT nasal spray Place 2 sprays into both nostrils daily. 07/27/17  Yes [provider]  Fluticasone-Salmeterol (ADVAIR DISKUS) 250-50 MCG/DOSE AEPB Inhale 1 puff into the lungs every 12 (twelve) hours.    Yes [provider]  Ibuprofen-Diphenhydramine Cit 200-38 MG TABS Take 1 tablet by mouth at bedtime.   Yes [provider]  levothyroxine (SYNTHROID, LEVOTHROID) 75 MCG tablet Take 75 mcg by mouth every morning. MUST TAKE BRAND NAME SYNTHROID   Yes [provider]  montelukast (SINGULAIR) 10 MG tablet Take 10 mg by mouth at bedtime. MUST BE BRAND NAME ONLY-SINGULAIR   Yes [provider]  Multiple Vitamin (MULITIVITAMIN WITH MINERALS) TABS Take 1 tablet by mouth daily.   Yes [provider]  mycophenolate (CELLCEPT) 500 MG tablet Take 1,000 mg by mouth 2 (two) times daily.    Yes [provider]  Omega-3 Fatty Acids (SUPER TWIN EPA/DHA) 1250 MG CAPS Take 1 capsule by mouth daily.   Yes [provider]  traZODone (DESYREL) 150 MG tablet Take 75 mg by mouth at bedtime.    Yes [provider]  valsartan-hydrochlorothiazide (DIOVAN-HCT) 160-12.5 MG tablet Take 1  tablet by mouth daily. 09/28/17  Yes [provider]  albuterol (PROVENTIL HFA;VENTOLIN HFA) 108 (90 BASE) MCG/ACT inhaler Inhale 2 puffs into the lungs every 6 (six) hours as needed for wheezing.     [provider]  anti-nausea (EMETROL) solution Take 10 mLs by mouth every 15 (fifteen) minutes as needed for nausea or vomiting.    [provider]  Calcium Carbonate (CALTRATE 600 PO) Take 600 mg by mouth 2 (two) times daily.     [provider]  ondansetron (ZOFRAN ODT) 8 MG disintegrating tablet 8mg  ODT q8  hours prn nausea Patient not taking: Reported on 10/23/2017 07/28/15   Palumbo, April, MD  oxyCODONE (ROXICODONE) 5 MG immediate release tablet Take  0.5 tablets (2.5 mg total) by mouth every 4 (four) hours as needed for severe pain. 10/23/17   Deno Etienne, DO  oxyCODONE-acetaminophen (PERCOCET/ROXICET) 5-325 MG tablet Take 1-2 tablets by mouth every 4 (four) hours as needed for moderate pain. Patient not taking: Reported on 10/23/2017 06/19/15   Erline Levine, MD  potassium chloride (K-DUR) 10 MEQ tablet Take 2 tablets (20 mEq total) by mouth 2 (two) times daily. Patient not taking: Reported on 10/23/2017 07/28/15   Veatrice Kells, MD    Family History Family History  Problem Relation Age of Onset  . Hypertension Mother   . Heart disease Mother        heart attack  . Stroke Father   . Heart disease Father        heart attack  . Breast cancer Neg Hx     Social History Social History   Tobacco Use  . Smoking status: Never Smoker  . Smokeless tobacco: Never Used  Substance Use Topics  . Alcohol use: Yes    Alcohol/week: 0.0 oz    Comment: OCCASIONAL  . Drug use: No     Allergies   Azathioprine; Other; Aminoglycosides; Avelox [moxifloxacin hcl in nacl]; Beta adrenergic blockers; Calcium channel blockers; Cephalosporins; Ciprofloxacin; Clindamycin/lincomycin; Colistin; Fosamax [alendronate sodium]; Imuran [azathioprine sodium]; Iodinated diagnostic  agents; Iodine; Macrolides and ketolides; Magnesium-containing compounds; Moxifloxacin; Norfloxacin; Ofloxacin; Pefloxacin; Penicillamine; Prednisolone; Prednisone; Procainamide; Quinidine; Quinine derivatives; Succinylcholine; Tubocurarine; Vecuronium; and Vicodin [hydrocodone-acetaminophen]   Review of Systems Review of Systems  Constitutional: Negative for chills and fever.  HENT: Negative for congestion and rhinorrhea.   Eyes: Negative for redness and visual disturbance.  Respiratory: Negative for shortness of breath and wheezing.   Cardiovascular: Negative for chest pain and palpitations.  Gastrointestinal: Negative for nausea and vomiting.  Genitourinary: Negative for dysuria and urgency.  Musculoskeletal: Positive for arthralgias and myalgias.  Skin: Negative for pallor and wound.  Neurological: Negative for dizziness and headaches.     Physical Exam Updated Vital Signs BP 120/60 (BP Location: Right Arm)   Pulse 62   Temp 98 F (36.7 C) (Oral)   Resp 18   Ht 5\' 5"  (1.651 m)   Wt 83.9 kg (185 lb)   SpO2 99%   BMI 30.79 kg/m   Physical Exam  Constitutional: She is oriented to person, place, and time. She appears well-developed and well-nourished. No distress.  HENT:  Head: Normocephalic and atraumatic.  Eyes: EOM are normal. Pupils are equal, round, and reactive to light.  Neck: Normal range of motion. Neck supple.  Cardiovascular: Normal rate and regular rhythm. Exam reveals no gallop and no friction rub.  No murmur heard. Pulmonary/Chest: Effort normal. She has no wheezes. She has no rales.  Abdominal: Soft. She exhibits no distension. There is no tenderness.  Musculoskeletal: She exhibits edema. She exhibits no tenderness.  Increased fullness to the posterior aspect of the left knee compared to the right.  Not warm no erythema.  Pain on palpation to the lateral aspect of the knee including the lateral ligaments and proximal fibula.  Pulse motor and sensation is  intact distally.  No edema to the joint itself.  Neurological: She is alert and oriented to person, place, and time.  Skin: Skin is warm and dry. She is not diaphoretic.  Psychiatric: She has a normal mood and affect. Her behavior is normal.  Nursing note and vitals reviewed.    ED Treatments / Results  Labs (all labs ordered are listed, but only abnormal results  are displayed) Labs Reviewed - No data to display  EKG  EKG Interpretation None       Radiology Dg Knee Complete 4 Views Left  Result Date: 10/23/2017 CLINICAL DATA:  Felt pop in knee, now unable to bear weight EXAM: LEFT KNEE - COMPLETE 4+ VIEW COMPARISON:  None. FINDINGS: There is tricompartmental degenerative joint disease of the left knee, primarily involving the medial compartment. Medially there is significant loss of joint space with some sclerosis. No fracture is seen. No joint effusion is noted. 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 Electronically Signed   By: Ivar Drape M.D.   On: 10/23/2017 12:09    Procedures Procedures (including critical care time)  Medications Ordered in ED Medications  acetaminophen (TYLENOL) tablet 1,000 mg (1,000 mg Oral Given 10/23/17 1225)     Initial Impression / Assessment and Plan / ED Course  I have reviewed the triage vital signs and the nursing notes.  Pertinent labs & imaging results that were available during my care of the patient were reviewed by me and considered in my medical decision making (see chart for details).     77 yo F with a chief complaint of left knee pain.  Likely this is a Baker's cyst based on history and physical.  Will obtain a screening x-ray.  Will place in a knee immobilizer.  Have her follow-up with her orthopedist.  X-ray negative.  3:10 PM:  I have discussed the diagnosis/risks/treatment options with the patient and family and believe the pt to be eligible for  discharge home to follow-up with PCP. We also discussed returning to the ED immediately if new or worsening sx occur. We discussed the sx which are most concerning (e.g., sudden worsening pain, fever, inability to tolerate by mouth) that necessitate immediate return. Medications administered to the patient during their visit and any new prescriptions provided to the patient are listed below.  Medications given during this visit Medications  acetaminophen (TYLENOL) tablet 1,000 mg (1,000 mg Oral Given 10/23/17 1225)     The patient appears reasonably screen and/or stabilized for discharge and I doubt any other medical condition or other [facility] requiring further screening, evaluation, or treatment in the ED at this time prior to discharge.   Final Clinical Impressions(s) / ED Diagnoses   Final diagnoses:  Acute pain of left knee    ED Discharge Orders        Ordered    oxyCODONE (ROXICODONE) 5 MG immediate release tablet  Every 4 hours PRN     10/23/17 Kaplan, Kenni Newton, DO 10/23/17 1510

## 2017-10-23 NOTE — ED Notes (Signed)
[facility] called for transport.  

## 2017-10-23 NOTE — ED Triage Notes (Signed)
Per EMS- Patient states she felt something pop behind her left knee and is now having increased pain behind her knee when she has weight bearing.

## 2017-10-23 NOTE — ED Notes (Signed)
Bed: [facility] Expected date:  Expected time:  Means of arrival:  Comments: 

## 2017-11-07 ENCOUNTER — Other Ambulatory Visit: Payer: Self-pay | Admitting: Internal Medicine

## 2017-11-07 DIAGNOSIS — Z139 Encounter for screening, unspecified: Secondary | ICD-10-CM

## 2017-11-13 DIAGNOSIS — L03114 Cellulitis of left upper limb: Secondary | ICD-10-CM | POA: Diagnosis not present

## 2017-11-13 DIAGNOSIS — Z23 Encounter for immunization: Secondary | ICD-10-CM | POA: Diagnosis not present

## 2017-11-13 DIAGNOSIS — R82998 Other abnormal findings in urine: Secondary | ICD-10-CM | POA: Diagnosis not present

## 2017-11-13 DIAGNOSIS — I10 Essential (primary) hypertension: Secondary | ICD-10-CM | POA: Diagnosis not present

## 2017-11-13 DIAGNOSIS — E782 Mixed hyperlipidemia: Secondary | ICD-10-CM | POA: Diagnosis not present

## 2017-11-13 DIAGNOSIS — E038 Other specified hypothyroidism: Secondary | ICD-10-CM | POA: Diagnosis not present

## 2017-11-13 DIAGNOSIS — S59912A Unspecified injury of left forearm, initial encounter: Secondary | ICD-10-CM | POA: Diagnosis not present

## 2017-11-13 DIAGNOSIS — M810 Age-related osteoporosis without current pathological fracture: Secondary | ICD-10-CM | POA: Diagnosis not present

## 2017-11-13 DIAGNOSIS — W5501XA Bitten by cat, initial encounter: Secondary | ICD-10-CM | POA: Diagnosis not present

## 2017-11-21 DIAGNOSIS — M810 Age-related osteoporosis without current pathological fracture: Secondary | ICD-10-CM | POA: Diagnosis not present

## 2017-11-21 DIAGNOSIS — Z0000 Encounter for general adult medical examination without abnormal findings: Secondary | ICD-10-CM | POA: Diagnosis not present

## 2017-11-21 DIAGNOSIS — Z1389 Encounter for screening for other disorder: Secondary | ICD-10-CM | POA: Diagnosis not present

## 2017-11-21 DIAGNOSIS — M25562 Pain in left knee: Secondary | ICD-10-CM | POA: Diagnosis not present

## 2017-11-21 DIAGNOSIS — Z6833 Body mass index (BMI) 33.0-33.9, adult: Secondary | ICD-10-CM | POA: Diagnosis not present

## 2017-11-21 DIAGNOSIS — F3289 Other specified depressive episodes: Secondary | ICD-10-CM | POA: Diagnosis not present

## 2017-11-21 DIAGNOSIS — E782 Mixed hyperlipidemia: Secondary | ICD-10-CM | POA: Diagnosis not present

## 2017-11-21 DIAGNOSIS — L03114 Cellulitis of left upper limb: Secondary | ICD-10-CM | POA: Diagnosis not present

## 2017-11-21 DIAGNOSIS — I10 Essential (primary) hypertension: Secondary | ICD-10-CM | POA: Diagnosis not present

## 2017-11-21 DIAGNOSIS — G7000 Myasthenia gravis without (acute) exacerbation: Secondary | ICD-10-CM | POA: Diagnosis not present

## 2017-11-21 DIAGNOSIS — E038 Other specified hypothyroidism: Secondary | ICD-10-CM | POA: Diagnosis not present

## 2017-11-21 DIAGNOSIS — J45909 Unspecified asthma, uncomplicated: Secondary | ICD-10-CM | POA: Diagnosis not present

## 2017-11-26 ENCOUNTER — Encounter (INDEPENDENT_AMBULATORY_CARE_PROVIDER_SITE_OTHER): Payer: Self-pay | Admitting: Orthopaedic Surgery

## 2017-11-26 ENCOUNTER — Ambulatory Visit (INDEPENDENT_AMBULATORY_CARE_PROVIDER_SITE_OTHER): Payer: Medicare Other | Admitting: Orthopaedic Surgery

## 2017-11-26 DIAGNOSIS — M1712 Unilateral primary osteoarthritis, left knee: Secondary | ICD-10-CM | POA: Diagnosis not present

## 2017-11-26 DIAGNOSIS — M25562 Pain in left knee: Secondary | ICD-10-CM | POA: Diagnosis not present

## 2017-11-26 NOTE — Progress Notes (Signed)
Office Visit Note   Patient: Sabrina [name]           Date of Birth: 04/21/1941           MRN: [id number] Visit Date: 11/26/2017              Requested by: Marton Redwood, MD [address] [place], [place] [address] PCP: Marton Redwood, MD   Assessment & Plan: Visit Diagnoses:  1. Acute pain of left knee   2. Unilateral primary osteoarthritis, left knee     Plan: She understands that she does have moderate to severe arthritis in her knee and she may have experienced an acute on chronic meniscal injury.  However she is pain-free at the moment.  She would like [place] to follow this conservatively and I agree with this as well.  She is not even asking for an injection today.  I showed her knee model and went over her x-rays and explained in detail along with her knee.  If this becomes more symptomatic she will let us know and come back and be seen.  Follow-Up Instructions: Return if symptoms worsen or fail to improve.   Orders:  No orders of the defined types were placed in this encounter.  No orders of the defined types were placed in this encounter.     Procedures: No procedures performed   Clinical Data: No additional findings.   Subjective: Chief Complaint  Patient presents with  . Left Knee - Pain  The patient is actually well-known to me even though she is listed as new we just have not seen her in a long period time.  She comes in with acute left knee injury that happened about 3 weeks ago.  She was doing an activity at home and felt a pop in her knee and had the inability to bear weight on the knee.  She went to emergency room and they obtained x-rays and did not see an acute fracture.  They placed her knee immobilizer and gave her some pain medication.  She is now completely pain-free.  She does ambulate with a cane.  She has not injured this knee before that she is aware of.  She points the medial side of her knee as a source of her pain but again today she is  pain-free.  HPI  Review of Systems She currently denies any headache, chest pain, shortness of breath, fever, chills, nausea, vomiting.  She is alert and oriented x3 and in no acute distress  Objective: Vital Signs: There were no vitals taken for this visit.  Physical Exam She is alert and oriented x3 and in no acute distress examination of her left knee shows fluid and full range of motion.  There is no joint effusion at all.  There is medial joint line tenderness. Ortho Exam  Specialty Comments:  No specialty comments available.  Imaging: No results found. I was able to independently review the x-rays of the left knee and she does have medial joint space narrowing and tricompartmental arthritic changes.    PMFS History: Patient Active Problem List   Diagnosis Date Noted  . Unilateral primary osteoarthritis, left knee 11/26/2017  . Lumbar stenosis 06/18/2015  . Essential hypertension 02/20/2014  . Bradycardia 02/20/2014  . Swelling of left lower extremity 02/20/2014  . Rotator cuff arthropathy 10/16/2012  . Carpal tunnel syndrome 10/16/2012  . Carpal tunnel syndrome of right wrist 05/31/2012  . Rotator cuff impingement syndrome 12/22/2011  . Irreducible incisional hernia  03/15/2011   Past Medical History:  Diagnosis Date  . Anxiety   . Arthritis   . Asthma   . Bradycardia    symptomatic  . Cancer ([place])    COUPLE OF SKIN CANCERS  . Carpal tunnel syndrome    LEFT --NUMBNESS  . Cataracts, bilateral   . Complication of anesthesia    BP FALLS/"ANAPHYLACTIC SHOCK"/ EXTREME TREMORS;  DID  FINE WITH SURGERIES APRIL AND OCT 2013 AT [facility] SURGERIES WERE SHORT PROCEDURES--PROBLEMS WITH ANESTHESIA SEEM TO OCCUR WITH THE LONGER PROCEDURES.THE TREMORS WERE AFTER HIP REPLACEMNT 22011-ARMS WERE FLAILING--PT STATES IT TOOK 4 DOSES OF DEMEROL BEFORE THE TREMORS STOPPED.  . Depression   . History of skin cancer   . Hyperlipidemia   . Hypertension    PT HAS LOST WEIGHT--B/P NOW  WITHIN NORMALS--IS ON B/P MEDICINE  . Hypothyroidism   . Myasthenia gravis    DOING WELL ON CELLCEPT-FOLLOWED BY DR. LISA HOBSON - [facility] NEUROLOGIST  . Osteoporosis   . Pain    LEFT SHOULDER PAIN-TORN ROTATOR CUFF-VERY LIMITED ROM  . Pain    TOP OF RIGHT SHOULDER--ROM OK AT PRESENT  . Pneumonia   . Rash, skin    LOWER ABDOMEN-FUNGAL INFECTION-PT HAS TOPICAL OINTMENT TO USE AS NEEDED.  Marland Kitchen Thyroid disease     Family History  Problem Relation Age of Onset  . Hypertension Mother   . Heart disease Mother        heart attack  . Stroke Father   . Heart disease Father        heart attack  . Breast cancer Neg Hx     Past Surgical History:  Procedure Laterality Date  . BELPHAROPTOSIS REPAIR  01/2006   PTOSIS REPAIR BILATERAL  . CARPAL TUNNEL RELEASE  05/31/2012   Procedure: CARPAL TUNNEL RELEASE;  Surgeon: Mcarthur Rossetti, MD;  Location: [facility];  Service: Orthopedics;  Laterality: Left;  Left Open Carpal Tunnel Release  . CARPAL TUNNEL RELEASE  10/10/2012   Procedure: CARPAL TUNNEL RELEASE;  Surgeon: Nita Sells, MD;  Location: [facility];  Service: Orthopedics;  Laterality: Left;  . 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 CHOLECYSTECTOMY     EXTREME DROP IN BLOOD PRESSURE WITH THIS SURGERY- YRS AGO- PT WAS 77 YRS OLD  . JOINT REPLACEMENT  10/19/09   RT HIP--SEVERE TREMORS, FLAILING OF ARMS AFTER THIS SURGERY.  Marland Kitchen left shoulder arthroscopy     . REVERSE SHOULDER ARTHROPLASTY  10/10/2012   Procedure: REVERSE SHOULDER ARTHROPLASTY;  Surgeon: Nita Sells, MD;  Location: [facility];  Service: Orthopedics;  Laterality: Left;  . TONSILLECTOMY AND ADENOIDECTOMY    . TOTAL HIP ARTHROPLASTY     right  . TUBAL LIGATION     ANAPHYLACTIC SHOCK AFTER THIS SURGERY-CAUSE THOUGHT TO  BE RELATED TO SODIUM PENTOTHAL   Social History   Occupational History  . Not on file  Tobacco Use  .  Smoking status: Never Smoker  . Smokeless tobacco: Never Used  Substance and Sexual Activity  . Alcohol use: Yes    Alcohol/week: 0.0 oz    Comment: OCCASIONAL  . Drug use: No  . Sexual activity: Not on file

## 2017-11-29 DIAGNOSIS — G7000 Myasthenia gravis without (acute) exacerbation: Secondary | ICD-10-CM | POA: Diagnosis not present

## 2017-11-29 DIAGNOSIS — Z79899 Other long term (current) drug therapy: Secondary | ICD-10-CM | POA: Diagnosis not present

## 2017-11-29 DIAGNOSIS — I671 Cerebral aneurysm, nonruptured: Secondary | ICD-10-CM | POA: Diagnosis not present

## 2017-11-30 DIAGNOSIS — Z1212 Encounter for screening for malignant neoplasm of rectum: Secondary | ICD-10-CM | POA: Diagnosis not present

## 2017-12-13 DIAGNOSIS — Z6833 Body mass index (BMI) 33.0-33.9, adult: Secondary | ICD-10-CM | POA: Diagnosis not present

## 2017-12-13 DIAGNOSIS — J309 Allergic rhinitis, unspecified: Secondary | ICD-10-CM | POA: Diagnosis not present

## 2017-12-13 DIAGNOSIS — J209 Acute bronchitis, unspecified: Secondary | ICD-10-CM | POA: Diagnosis not present

## 2017-12-13 DIAGNOSIS — R05 Cough: Secondary | ICD-10-CM | POA: Diagnosis not present

## 2017-12-17 ENCOUNTER — Ambulatory Visit
Admission: RE | Admit: 2017-12-17 | Discharge: 2017-12-17 | Disposition: A | Discharge status: Home / Self Care | Payer: Medicare Other | Source: Ambulatory Visit | Attending: Internal Medicine | Admitting: Internal Medicine

## 2017-12-17 DIAGNOSIS — Z139 Encounter for screening, unspecified: Secondary | ICD-10-CM

## 2017-12-17 DIAGNOSIS — Z1231 Encounter for screening mammogram for malignant neoplasm of breast: Secondary | ICD-10-CM | POA: Diagnosis not present

## 2017-12-26 DIAGNOSIS — H26493 Other secondary cataract, bilateral: Secondary | ICD-10-CM | POA: Diagnosis not present

## 2017-12-26 DIAGNOSIS — Z961 Presence of intraocular lens: Secondary | ICD-10-CM | POA: Diagnosis not present

## 2017-12-30 DIAGNOSIS — I671 Cerebral aneurysm, nonruptured: Secondary | ICD-10-CM | POA: Diagnosis not present

## 2018-02-04 DIAGNOSIS — Z85828 Personal history of other malignant neoplasm of skin: Secondary | ICD-10-CM | POA: Diagnosis not present

## 2018-02-04 DIAGNOSIS — H61001 Unspecified perichondritis of right external ear: Secondary | ICD-10-CM | POA: Diagnosis not present

## 2018-02-04 DIAGNOSIS — L821 Other seborrheic keratosis: Secondary | ICD-10-CM | POA: Diagnosis not present

## 2018-03-03 IMAGING — CR DG KNEE COMPLETE 4+V*L*
4 series · 4 of 4 positions shown · non-contrast
Comparison: None.

CLINICAL DATA: Felt pop in knee, now unable to bear weight

EXAM:
LEFT KNEE - COMPLETE 4+ VIEW

[x knee ap left (1 of 3)]
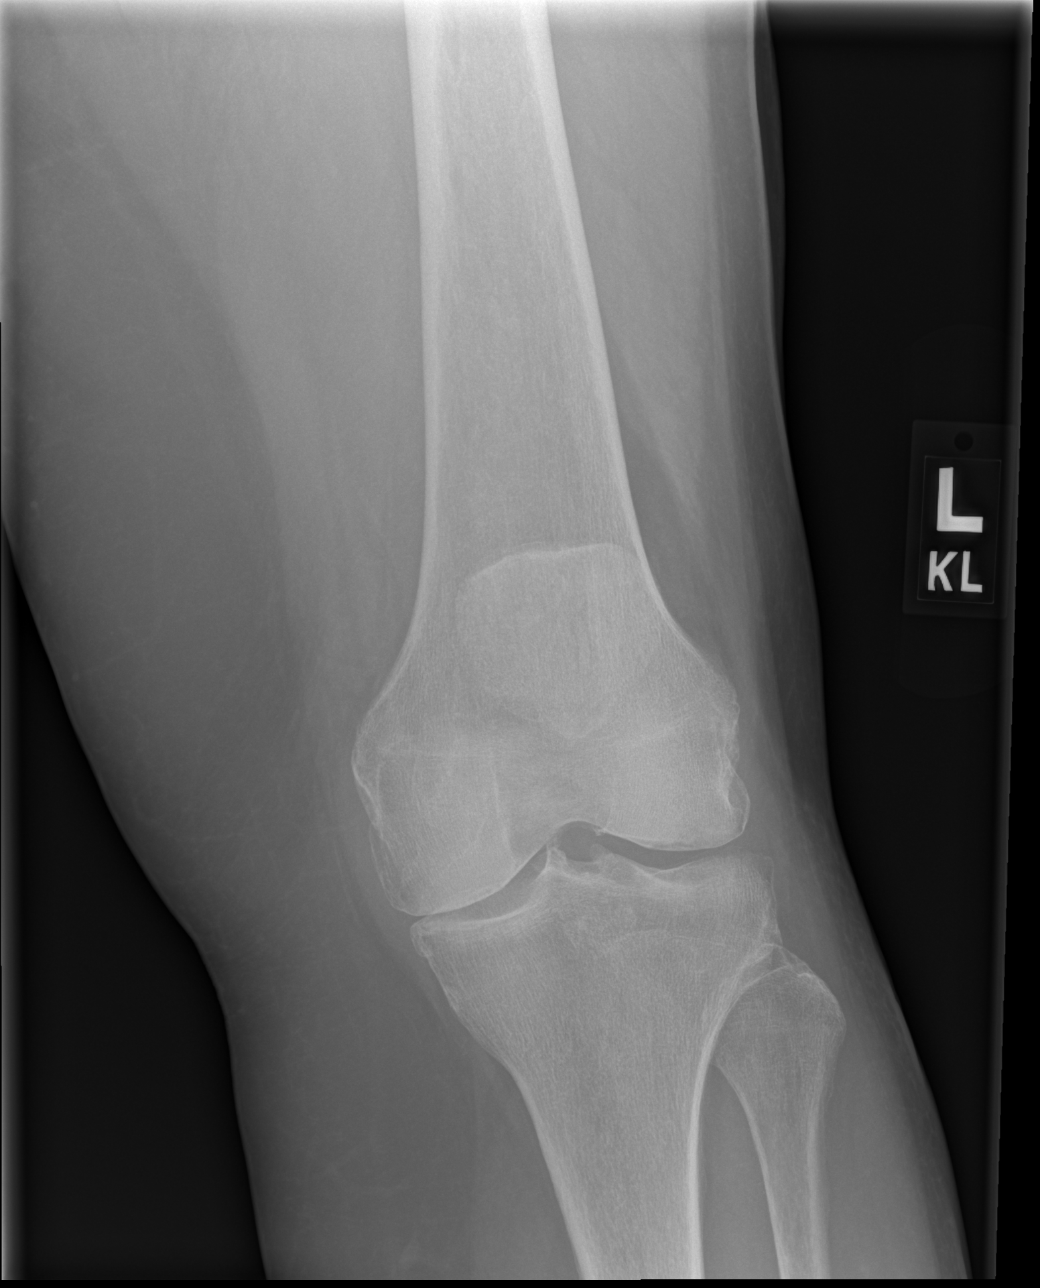

[x knee ap left (2 of 3)]
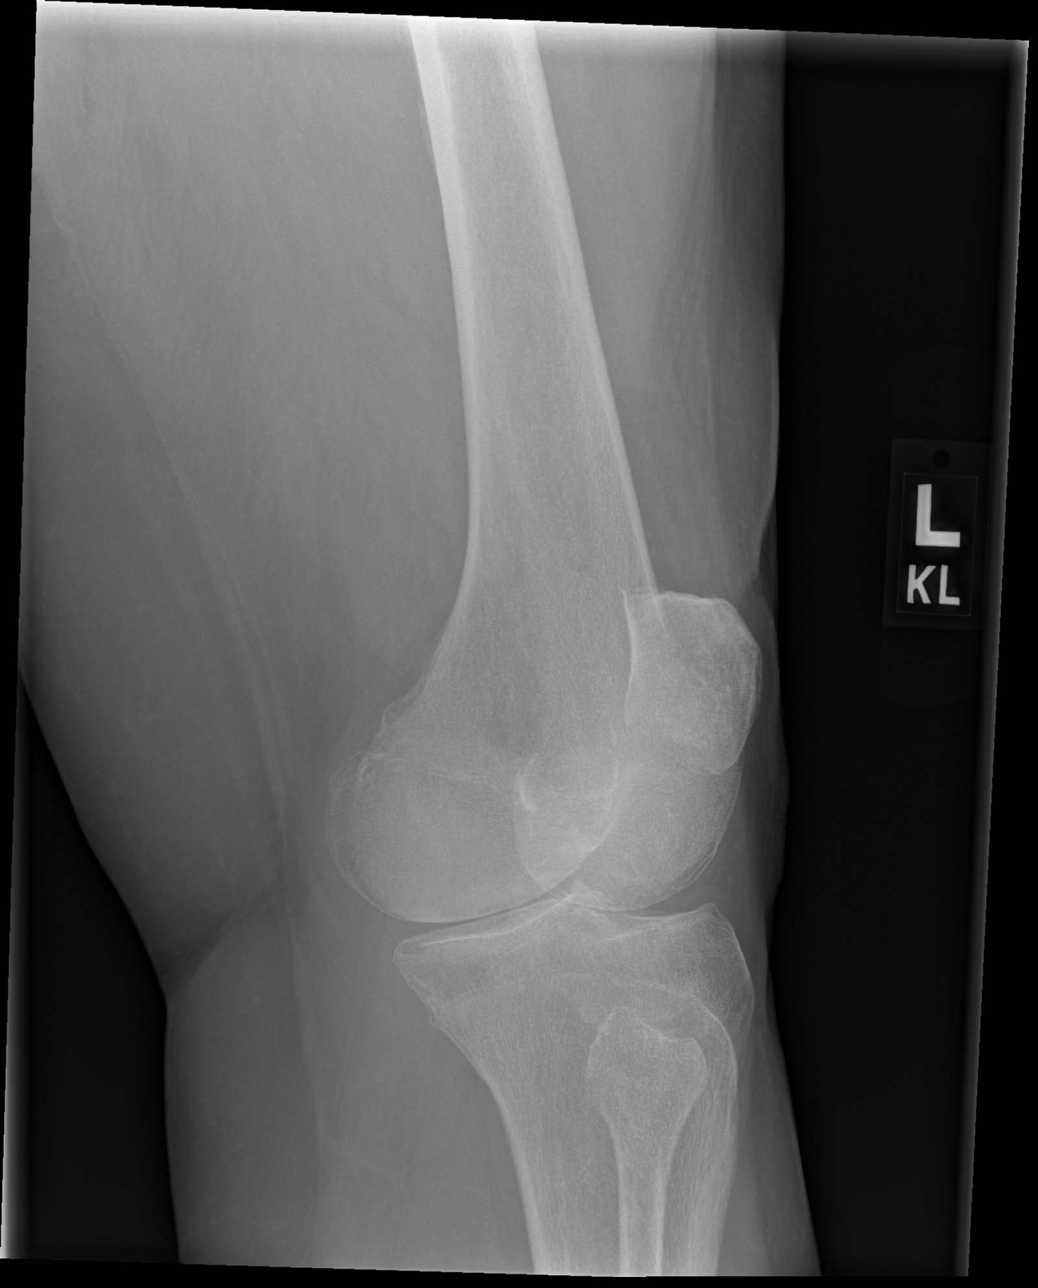

[x knee ap left (3 of 3)]
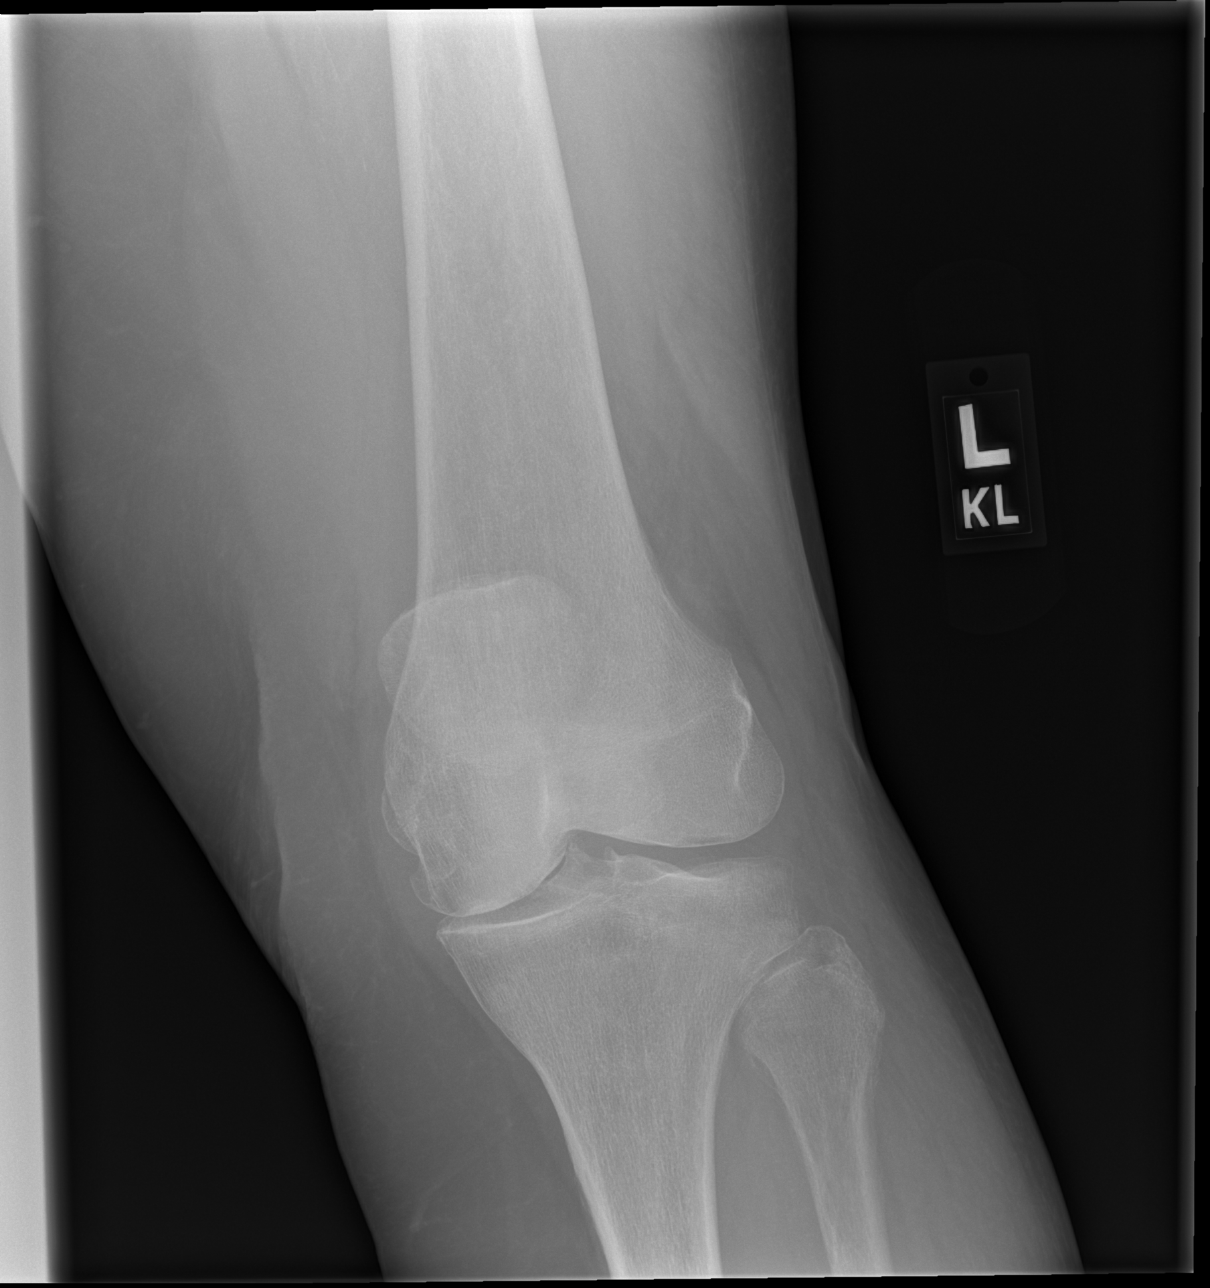

[x knee lat left]
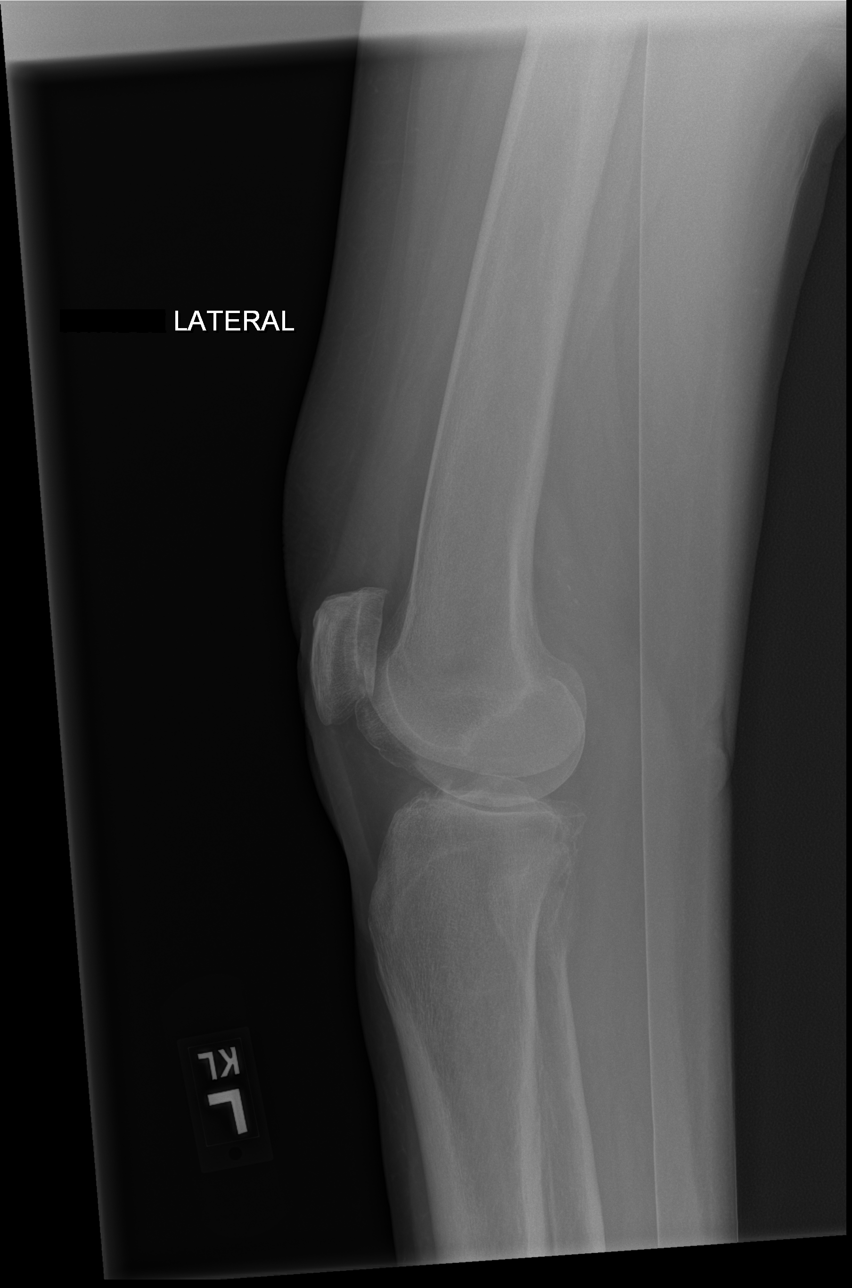

[4 of 4 positions shown; findings below may reference images not displayed]

FINDINGS: There is tricompartmental degenerative joint disease of the left
knee, primarily involving the medial compartment. Medially there is
significant loss of joint space with some sclerosis. No fracture is
seen. No joint effusion is noted.
IMPRESSION: Tricompartmental degenerative joint disease of the left knee
primarily involving the medial compartment.

## 2018-03-08 DIAGNOSIS — L920 Granuloma annulare: Secondary | ICD-10-CM | POA: Diagnosis not present

## 2018-03-08 DIAGNOSIS — Z85828 Personal history of other malignant neoplasm of skin: Secondary | ICD-10-CM | POA: Diagnosis not present

## 2018-03-08 DIAGNOSIS — L0889 Other specified local infections of the skin and subcutaneous tissue: Secondary | ICD-10-CM | POA: Diagnosis not present

## 2018-03-08 DIAGNOSIS — L720 Epidermal cyst: Secondary | ICD-10-CM | POA: Diagnosis not present

## 2018-03-08 DIAGNOSIS — L02214 Cutaneous abscess of groin: Secondary | ICD-10-CM | POA: Diagnosis not present

## 2018-03-26 DIAGNOSIS — I10 Essential (primary) hypertension: Secondary | ICD-10-CM | POA: Diagnosis not present

## 2018-03-26 DIAGNOSIS — Z6833 Body mass index (BMI) 33.0-33.9, adult: Secondary | ICD-10-CM | POA: Diagnosis not present

## 2018-03-26 DIAGNOSIS — R109 Unspecified abdominal pain: Secondary | ICD-10-CM | POA: Diagnosis not present

## 2018-03-26 DIAGNOSIS — R1032 Left lower quadrant pain: Secondary | ICD-10-CM | POA: Diagnosis not present

## 2018-03-27 DIAGNOSIS — K439 Ventral hernia without obstruction or gangrene: Secondary | ICD-10-CM | POA: Diagnosis not present

## 2018-07-02 DIAGNOSIS — Z6832 Body mass index (BMI) 32.0-32.9, adult: Secondary | ICD-10-CM | POA: Diagnosis not present

## 2018-07-02 DIAGNOSIS — R05 Cough: Secondary | ICD-10-CM | POA: Diagnosis not present

## 2018-07-02 DIAGNOSIS — D849 Immunodeficiency, unspecified: Secondary | ICD-10-CM | POA: Diagnosis not present

## 2018-07-02 DIAGNOSIS — J45909 Unspecified asthma, uncomplicated: Secondary | ICD-10-CM | POA: Diagnosis not present

## 2018-07-02 DIAGNOSIS — J209 Acute bronchitis, unspecified: Secondary | ICD-10-CM | POA: Diagnosis not present

## 2018-07-02 DIAGNOSIS — J45901 Unspecified asthma with (acute) exacerbation: Secondary | ICD-10-CM | POA: Diagnosis not present

## 2018-07-16 DIAGNOSIS — Z85828 Personal history of other malignant neoplasm of skin: Secondary | ICD-10-CM | POA: Diagnosis not present

## 2018-07-16 DIAGNOSIS — B0089 Other herpesviral infection: Secondary | ICD-10-CM | POA: Diagnosis not present

## 2018-08-20 DIAGNOSIS — R05 Cough: Secondary | ICD-10-CM | POA: Diagnosis not present

## 2018-08-20 DIAGNOSIS — J069 Acute upper respiratory infection, unspecified: Secondary | ICD-10-CM | POA: Diagnosis not present

## 2018-08-20 DIAGNOSIS — Z6833 Body mass index (BMI) 33.0-33.9, adult: Secondary | ICD-10-CM | POA: Diagnosis not present

## 2018-08-20 DIAGNOSIS — J45998 Other asthma: Secondary | ICD-10-CM | POA: Diagnosis not present

## 2018-08-20 DIAGNOSIS — B0089 Other herpesviral infection: Secondary | ICD-10-CM | POA: Diagnosis not present

## 2018-09-05 DIAGNOSIS — Z23 Encounter for immunization: Secondary | ICD-10-CM | POA: Diagnosis not present

## 2018-12-05 ENCOUNTER — Ambulatory Visit: Payer: Medicare Other | Admitting: Podiatry

## 2019-01-30 DIAGNOSIS — E038 Other specified hypothyroidism: Secondary | ICD-10-CM | POA: Diagnosis not present

## 2019-01-30 DIAGNOSIS — M810 Age-related osteoporosis without current pathological fracture: Secondary | ICD-10-CM | POA: Diagnosis not present

## 2019-01-30 DIAGNOSIS — E782 Mixed hyperlipidemia: Secondary | ICD-10-CM | POA: Diagnosis not present

## 2019-02-06 DIAGNOSIS — M810 Age-related osteoporosis without current pathological fracture: Secondary | ICD-10-CM | POA: Diagnosis not present

## 2019-02-06 DIAGNOSIS — E669 Obesity, unspecified: Secondary | ICD-10-CM | POA: Diagnosis not present

## 2019-02-06 DIAGNOSIS — D849 Immunodeficiency, unspecified: Secondary | ICD-10-CM | POA: Diagnosis not present

## 2019-02-06 DIAGNOSIS — J45909 Unspecified asthma, uncomplicated: Secondary | ICD-10-CM | POA: Diagnosis not present

## 2019-02-06 DIAGNOSIS — G7000 Myasthenia gravis without (acute) exacerbation: Secondary | ICD-10-CM | POA: Diagnosis not present

## 2019-02-06 DIAGNOSIS — E782 Mixed hyperlipidemia: Secondary | ICD-10-CM | POA: Diagnosis not present

## 2019-02-06 DIAGNOSIS — Z1339 Encounter for screening examination for other mental health and behavioral disorders: Secondary | ICD-10-CM | POA: Diagnosis not present

## 2019-02-06 DIAGNOSIS — E039 Hypothyroidism, unspecified: Secondary | ICD-10-CM | POA: Diagnosis not present

## 2019-02-06 DIAGNOSIS — I10 Essential (primary) hypertension: Secondary | ICD-10-CM | POA: Diagnosis not present

## 2019-02-06 DIAGNOSIS — F325 Major depressive disorder, single episode, in full remission: Secondary | ICD-10-CM | POA: Diagnosis not present

## 2019-02-06 DIAGNOSIS — Z0000 Encounter for general adult medical examination without abnormal findings: Secondary | ICD-10-CM | POA: Diagnosis not present

## 2019-02-06 DIAGNOSIS — Z1331 Encounter for screening for depression: Secondary | ICD-10-CM | POA: Diagnosis not present

## 2019-02-06 DIAGNOSIS — J309 Allergic rhinitis, unspecified: Secondary | ICD-10-CM | POA: Diagnosis not present

## 2019-02-17 DIAGNOSIS — R82998 Other abnormal findings in urine: Secondary | ICD-10-CM | POA: Diagnosis not present

## 2019-02-17 DIAGNOSIS — I10 Essential (primary) hypertension: Secondary | ICD-10-CM | POA: Diagnosis not present

## 2019-02-20 DIAGNOSIS — G7000 Myasthenia gravis without (acute) exacerbation: Secondary | ICD-10-CM | POA: Diagnosis not present

## 2019-03-05 DIAGNOSIS — M25531 Pain in right wrist: Secondary | ICD-10-CM | POA: Diagnosis not present

## 2019-03-05 DIAGNOSIS — M19011 Primary osteoarthritis, right shoulder: Secondary | ICD-10-CM | POA: Diagnosis not present

## 2019-03-18 ENCOUNTER — Ambulatory Visit (INDEPENDENT_AMBULATORY_CARE_PROVIDER_SITE_OTHER): Payer: Medicare Other | Admitting: Podiatry

## 2019-03-18 ENCOUNTER — Other Ambulatory Visit: Payer: Self-pay

## 2019-03-18 ENCOUNTER — Encounter: Payer: Self-pay | Admitting: Podiatry

## 2019-03-18 VITALS — Temp 97.9°F

## 2019-03-18 DIAGNOSIS — M79675 Pain in left toe(s): Secondary | ICD-10-CM | POA: Diagnosis not present

## 2019-03-18 DIAGNOSIS — M79674 Pain in right toe(s): Secondary | ICD-10-CM

## 2019-03-18 DIAGNOSIS — B351 Tinea unguium: Secondary | ICD-10-CM

## 2019-03-18 NOTE — Patient Instructions (Addendum)
Ingrown Toenail An ingrown toenail occurs when the corner or sides of a toenail grow into the surrounding skin. This causes discomfort and pain. The big toe is most commonly affected, but any of the toes can be affected. If an ingrown toenail is not treated, it can become infected. What are the causes? This condition may be caused by:  Wearing shoes that are too small or tight.  An injury, such as stubbing your toe or having your toe stepped on.  Improper cutting or care of your toenails.  Having nail or foot abnormalities that were present from birth (congenital abnormalities), such as having a nail that is too big for your toe. What increases the risk? The following factors may make you more likely to develop ingrown toenails:  Age. Nails tend to get thicker with age, so ingrown nails are more common among older people.  Cutting your toenails incorrectly, such as cutting them very short or cutting them unevenly. An ingrown toenail is more likely to get infected if you have:  Diabetes.  Blood flow (circulation) problems. What are the signs or symptoms? Symptoms of an ingrown toenail may include:  Pain, soreness, or tenderness.  Redness.  Swelling.  Hardening of the skin that surrounds the toenail. Signs that an ingrown toenail may be infected include:  Fluid or pus.  Symptoms that get worse instead of better. How is this diagnosed? An ingrown toenail may be diagnosed based on your medical history, your symptoms, and a physical exam. If you have fluid or blood coming from your toenail, a sample may be collected to test for the specific type of bacteria that is causing the infection. How is this treated? Treatment depends on how severe your ingrown toenail is. You may be able to care for your toenail at home.  If you have an infection, you may be prescribed antibiotic medicines.  If you have fluid or pus draining from your toenail, your health care provider may drain  it.  If you have trouble walking, you may be given crutches to use.  If you have a severe or infected ingrown toenail, you may need a procedure to remove part or all of the nail. Follow these instructions at home: Foot care   Do not pick at your toenail or try to remove it yourself.  Soak your foot in warm, soapy water. Do this for 20 minutes, 3 times a day, or as often as told by your health care provider. This helps to keep your toe clean and keep your skin soft.  Wear shoes that fit well and are not too tight. Your health care provider may recommend that you wear open-toed shoes while you heal.  Trim your toenails regularly and carefully. Cut your toenails straight across to prevent injury to the skin at the corners of the toenail. Do not cut your nails in a curved shape.  Keep your feet clean and dry to help prevent infection. Medicines  Take over-the-counter and prescription medicines only as told by your health care provider.  If you were prescribed an antibiotic, take it as told by your health care provider. Do not stop taking the antibiotic even if you start to feel better. Activity  Return to your normal activities as told by your health care provider. Ask your health care provider what activities are safe for you.  Avoid activities that cause pain. General instructions  If your health care provider told you to use crutches to help you move around, use them   as instructed.  Keep all follow-up visits as told by your health care provider. This is important. Contact a health care provider if:  You have more redness, swelling, pain, or other symptoms that do not improve with treatment.  You have fluid, blood, or pus coming from your toenail. Get help right away if:  You have a red streak on your skin that starts at your foot and spreads up your leg.  You have a fever. Summary  An ingrown toenail occurs when the corner or sides of a toenail grow into the surrounding  skin. This causes discomfort and pain. The big toe is most commonly affected, but any of the toes can be affected.  If an ingrown toenail is not treated, it can become infected.  Fluid or pus draining from your toenail is a sign of infection. Your health care provider may need to drain it. You may be given antibiotics to treat the infection.  Trimming your toenails regularly and properly can help you prevent an ingrown toenail. This information is not intended to replace advice given to you by your health care provider. Make sure you discuss any questions you have with your health care provider. Document Released: 08/18/2000 Document Revised: 12/13/2018 Document Reviewed: 05/09/2017 Elsevier Patient Education  2020 [employer].

## 2019-03-23 NOTE — Progress Notes (Signed)
Subjective: Sabrina [name] presents today referred by Marton Redwood, MD with cc of painful, discolored, thick toenails x 3 months which interfere with daily activities.  Pain is aggravated when wearing enclosed shoe gear. She has tried nothing to treat her condition.  Past Medical History:  Diagnosis Date  . Anxiety   . Arthritis   . Asthma   . Bradycardia    symptomatic  . Cancer ([place])    COUPLE OF SKIN CANCERS  . Carpal tunnel syndrome    LEFT --NUMBNESS  . Cataracts, bilateral   . Complication of anesthesia    BP FALLS/"ANAPHYLACTIC SHOCK"/ EXTREME TREMORS;  DID  FINE WITH SURGERIES APRIL AND OCT 2013 AT [facility] SURGERIES WERE SHORT PROCEDURES--PROBLEMS WITH ANESTHESIA SEEM TO OCCUR WITH THE LONGER PROCEDURES.THE TREMORS WERE AFTER HIP REPLACEMNT 22011-ARMS WERE FLAILING--PT STATES IT TOOK 4 DOSES OF DEMEROL BEFORE THE TREMORS STOPPED.  . Depression   . History of skin cancer   . Hyperlipidemia   . Hypertension    PT HAS LOST WEIGHT--B/P NOW WITHIN NORMALS--IS ON B/P MEDICINE  . Hypothyroidism   . Myasthenia gravis    DOING WELL ON CELLCEPT-FOLLOWED BY DR. LISA HOBSON - [facility] NEUROLOGIST  . Osteoporosis   . Pain    LEFT SHOULDER PAIN-TORN ROTATOR CUFF-VERY LIMITED ROM  . Pain    TOP OF RIGHT SHOULDER--ROM OK AT PRESENT  . Pneumonia   . Rash, skin    LOWER ABDOMEN-FUNGAL INFECTION-PT HAS TOPICAL OINTMENT TO USE AS NEEDED.  Marland Kitchen Thyroid disease      Patient Active Problem List   Diagnosis Date Noted  . Unilateral primary osteoarthritis, left knee 11/26/2017  . Lumbar stenosis 06/18/2015  . Essential hypertension 02/20/2014  . Bradycardia 02/20/2014  . Swelling of left lower extremity 02/20/2014  . Rotator cuff arthropathy 10/16/2012  . Carpal tunnel syndrome 10/16/2012  . Carpal tunnel syndrome of right wrist 05/31/2012  . Rotator cuff impingement syndrome 12/22/2011  . Irreducible incisional hernia 03/15/2011     Past Surgical History:  Procedure Laterality Date  .  BELPHAROPTOSIS REPAIR  01/2006   PTOSIS REPAIR BILATERAL  . CARPAL TUNNEL RELEASE  05/31/2012   Procedure: CARPAL TUNNEL RELEASE;  Surgeon: Mcarthur Rossetti, MD;  Location: [facility];  Service: Orthopedics;  Laterality: Left;  Left Open Carpal Tunnel Release  . CARPAL TUNNEL RELEASE  10/10/2012   Procedure: CARPAL TUNNEL RELEASE;  Surgeon: Nita Sells, MD;  Location: [facility];  Service: Orthopedics;  Laterality: Left;  . 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 CHOLECYSTECTOMY     EXTREME DROP IN BLOOD PRESSURE WITH THIS SURGERY- YRS AGO- PT WAS 78 YRS OLD  . JOINT REPLACEMENT  10/19/09   RT HIP--SEVERE TREMORS, FLAILING OF ARMS AFTER THIS SURGERY.  Marland Kitchen left shoulder arthroscopy     . REVERSE SHOULDER ARTHROPLASTY  10/10/2012   Procedure: REVERSE SHOULDER ARTHROPLASTY;  Surgeon: Nita Sells, MD;  Location: [facility];  Service: Orthopedics;  Laterality: Left;  . TONSILLECTOMY AND ADENOIDECTOMY    . TOTAL HIP ARTHROPLASTY     right  . TUBAL LIGATION     ANAPHYLACTIC SHOCK AFTER THIS SURGERY-CAUSE THOUGHT TO  BE RELATED TO SODIUM PENTOTHAL      Current Outpatient Medications:  .  Azelastine HCl 0.15 % SOLN, U 2 SPRAYS IEN 2 TO 3 TIMES A DAY FOR SINUS COG, Disp: , Rfl:  .  Multiple  Vitamins-Minerals (CENTRUM SILVER) tablet, Take by mouth., Disp: , Rfl:  .  nystatin (MYCOSTATIN) 100000 UNIT/ML suspension, SAS 5 ML B MEALS AND QHS, Disp: , Rfl:  .  albuterol (PROVENTIL HFA;VENTOLIN HFA) 108 (90 BASE) MCG/ACT inhaler, Inhale 2 puffs into the lungs every 6 (six) hours as needed for wheezing. , Disp: , Rfl:  .  ALPRAZolam (XANAX) 0.5 MG tablet, Take 0.5 mg by mouth every 6 (six) hours. , Disp: , Rfl:  .  amoxicillin (AMOXIL) 500 MG capsule, TK 4 CS PO 1 HOUR BEFORE SURGERY, Disp: , Rfl:  .  anti-nausea (EMETROL) solution, Take 10 mLs by mouth every 15 (fifteen) minutes as needed for nausea or  vomiting., Disp: , Rfl:  .  Ascorbic Acid (VITAMIN C) 1000 MG tablet, Take 1,000 mg by mouth every morning. , Disp: , Rfl:  .  Calcium Carbonate (CALTRATE 600 PO), Take 600 mg by mouth 2 (two) times daily. , Disp: , Rfl:  .  Cholecalciferol (VITAMIN D-3 PO), Take 1,000 mg by mouth every morning. , Disp: , Rfl:  .  FLUoxetine (PROZAC) 20 MG capsule, , Disp: , Rfl:  .  FLUoxetine (PROZAC) 20 MG tablet, Take 20 mg by mouth every morning. , Disp: , Rfl:  .  fluticasone (FLONASE) 50 MCG/ACT nasal spray, Place 2 sprays into both nostrils daily., Disp: , Rfl: 8 .  Fluticasone-Salmeterol (ADVAIR DISKUS) 250-50 MCG/DOSE AEPB, Inhale 1 puff into the lungs every 12 (twelve) hours. , Disp: , Rfl:  .  Ibuprofen-Diphenhydramine Cit 200-38 MG TABS, Take 1 tablet by mouth at bedtime., Disp: , Rfl:  .  levothyroxine (SYNTHROID, LEVOTHROID) 75 MCG tablet, Take 75 mcg by mouth every morning. MUST TAKE BRAND NAME SYNTHROID, Disp: , Rfl:  .  montelukast (SINGULAIR) 10 MG tablet, Take 10 mg by mouth at bedtime. MUST BE BRAND NAME ONLY-SINGULAIR, Disp: , Rfl:  .  Multiple Vitamin (MULITIVITAMIN WITH MINERALS) TABS, Take 1 tablet by mouth daily., Disp: , Rfl:  .  mycophenolate (CELLCEPT) 500 MG tablet, Take 1,000 mg by mouth 2 (two) times daily. , Disp: , Rfl:  .  Omega-3 Fatty Acids (SUPER TWIN EPA/DHA) 1250 MG CAPS, Take 1 capsule by mouth daily., Disp: , Rfl:  .  ondansetron (ZOFRAN ODT) 8 MG disintegrating tablet, 8mg  ODT q8  hours prn nausea (Patient not taking: Reported on 10/23/2017), Disp: 8 tablet, Rfl: 0 .  oxyCODONE (ROXICODONE) 5 MG immediate release tablet, Take 0.5 tablets (2.5 mg total) by mouth every 4 (four) hours as needed for severe pain., Disp: 2 tablet, Rfl: 0 .  oxyCODONE-acetaminophen (PERCOCET/ROXICET) 5-325 MG tablet, Take 1-2 tablets by mouth every 4 (four) hours as needed for moderate pain. (Patient not taking: Reported on 10/23/2017), Disp: 80 tablet, Rfl: 0 .  potassium chloride (K-DUR) 10 MEQ  tablet, Take 2 tablets (20 mEq total) by mouth 2 (two) times daily. (Patient not taking: Reported on 10/23/2017), Disp: 14 tablet, Rfl: 0 .  traZODone (DESYREL) 150 MG tablet, Take 75 mg by mouth at bedtime. , Disp: , Rfl:  .  valsartan-hydrochlorothiazide (DIOVAN-HCT) 160-12.5 MG tablet, Take 1 tablet by mouth daily., Disp: , Rfl: 6   Allergies  Allergen Reactions  . Azathioprine Anaphylaxis and Other (See Comments)    Kidneys shut down  . Other Anaphylaxis    Sodium pentathol Uncoded Allergy. Allergen: Sodium pentathol  . Aminoglycosides     Tobramycin, gentamycin, kanamycin, neomycin, streptomycin Can't take because of myasthenia gravis  . Avelox [Moxifloxacin Hcl In Nacl] Other (  See Comments)    Can't take because of myasthenia gravis  . Beta Adrenergic Blockers     Proponolol, timolol maleate eyedrops Can't take because of myasthenia gravis  . Calcium Channel Blockers     Blood pressure medications Can't take because of myasthenia gravis  . Cephalosporins     Can't take because of myasthenia gravis  . Ciprofloxacin Other (See Comments)    Can't take because of myasthenia gravis Can't take because of myasthenia gravis  . Clindamycin/Lincomycin     May exascerbate myasthenia gravis  . Colistin     Can't take because of myasthenia gravis  . Fosamax [Alendronate Sodium]     LEG WEAKNESS  . Imuran [Azathioprine Sodium] Other (See Comments)    Kidneys shut down  . Iodinated Diagnostic Agents Other (See Comments)    Contraindicated to Myasthenia Gravis  . Iodine     Iodine contrast Can't take because of myasthenia gravis  . Macrolides And Ketolides     Erythromocin, azithromycin, telithromycin Can't take because of myasthenia gravis  . Magnesium-Containing Compounds     Including milk of magnesia, antacids containing magnesium hydroxide (maalox, mylanta) and epsom salts Can't take because of myasthenia gravis  . Moxifloxacin Other (See Comments)    Can't take because of  myasthenia gravis  . Norfloxacin Other (See Comments)    Can't take because of myasthenia gravis Can't take because of myasthenia gravis  . Ofloxacin Other (See Comments)    Can't take because of myasthenia gravis Can't take because of myasthenia gravis  . Pefloxacin Other (See Comments)    Can't take because of myasthenia gravis Can't take because of myasthenia gravis  . Penicillamine     do not use due to Myasthenia Gravis    . Prednisolone Nausea And Vomiting  . Prednisone Nausea And Vomiting  . Procainamide     Can't take because of myasthenia gravis  . Quinidine     Can't take because of myasthenia gravis  . Quinine Derivatives     Can't take because of myasthenia gravis  . Succinylcholine     Can't take because of myasthenia gravis No paralytics  . Tubocurarine     Can't take because of myasthenia gravis  . Vecuronium     Can't take because of myasthenia gravis No paralytics  . Vicodin [Hydrocodone-Acetaminophen] Other (See Comments)    Pt suffers from bad headaches     Social History   Occupational History  . Not on file  Tobacco Use  . Smoking status: Never Smoker  . Smokeless tobacco: Never Used  Substance and Sexual Activity  . Alcohol use: Yes    Comment: OCCASIONAL  . Drug use: No  . Sexual activity: Not on file     Family History  Problem Relation Age of Onset  . Hypertension Mother   . Heart disease Mother        heart attack  . Stroke Father   . Heart disease Father        heart attack  . Breast cancer Neg Hx       There is no immunization history on file for this patient.   Review of systems: Positive Findings in bold print.  Constitutional:  chills, fatigue, fever, sweats, weight change Communication: Optometrist, sign Ecologist, hand writing, iPad/Android device Head: headaches, head injury Eyes: changes in vision, eye pain, glaucoma, cataracts, macular degeneration, diplopia, glare,  light sensitivity, eyeglasses or  contacts, blindness Ears nose mouth throat: hearing impaired, hearing aids,  ringing in ears, deaf, sign language,  vertigo,   nosebleeds,  rhinitis,  cold sores, snoring, swollen glands Cardiovascular: HTN, edema, arrhythmia, pacemaker in place, defibrillator in place, chest pain/tightness, chronic anticoagulation, blood clot, heart failure, MI Peripheral Vascular: leg cramps, varicose veins, blood clots, lymphedema, varicosities Respiratory:  Asthma, difficulty breathing, denies congestion, SOB, wheezing, cough, emphysema Gastrointestinal: change in appetite or weight, abdominal pain, constipation, diarrhea, nausea, vomiting, vomiting blood, change in bowel habits, abdominal pain, jaundice, rectal bleeding, hemorrhoids, GERD Genitourinary:  nocturia,  pain on urination, polyuria,  blood in urine, Foley catheter, urinary urgency, ESRD on hemodialysis Musculoskeletal: amputation, cramping, stiff joints, painful joints, decreased joint motion, fractures, OA, gout, hemiplegia, paraplegia, uses cane, wheelchair bound, uses walker, uses rollator Skin: +changes in toenails, color change, dryness, itching, mole changes,  rash, wound(s) Neurological: headaches, numbness in feet, paresthesias in feet, burning in feet, fainting,  seizures, change in speech. denies headaches, memory problems/poor historian, cerebral palsy, weakness, paralysis, CVA, TIA Endocrine: diabetes, hypothyroidism, hyperthyroidism,  goiter, dry mouth, flushing, heat intolerance,  cold intolerance,  excessive thirst, denies polyuria,  nocturia Hematological:  easy bleeding, excessive bleeding, easy bruising, enlarged lymph nodes, on long term blood thinner, history of past transusions Allergy/immunological:  hives, eczema, frequent infections, multiple drug allergies, seasonal allergies, transplant recipient, multiple food allergies Psychiatric:  anxiety, depression, mood disorder, suicidal ideations, hallucinations,  insomnia  Objective: Vitals:   03/18/19 1419  Temp: 97.9 F (36.6 C)    Vascular Examination: Capillary refill time <3 seconds x 10 digits.  Dorsalis pedis pulses palpable b/l.   Posterior tibial pulses faintly palpable b/l.   No digital hair x 10 digits.  Skin temperature gradient WNL b/l.  Dermatological Examination: Skin with normal turgor, texture and tone b/l.  Toenails 1-5 b/l discolored, thick, dystrophic with subungual debris and pain with palpation to nailbeds due to thickness of nails.  Musculoskeletal: Muscle strength 5/5 to all LE muscle groups.  Neurological: Sensation intact with 10 gram monofilament.  Vibratory sensation intact.  Assessment: 1. Painful onychomycosis toenails 1-5 b/l   Plan: 1. Discussed onychomycosis and treatment options.  Literature dispensed on today. 2. Toenails 1-5 b/l were debrided in length and girth without iatrogenic bleeding. 3. Patient to continue soft, supportive shoe gear daily. 4. Patient to report any pedal injuries to medical professional immediately. 5. Follow up 3 months.  6. Patient/POA to call should there be a concern in the interim.

## 2019-05-15 ENCOUNTER — Other Ambulatory Visit: Payer: Self-pay | Admitting: Internal Medicine

## 2019-05-15 DIAGNOSIS — Z1231 Encounter for screening mammogram for malignant neoplasm of breast: Secondary | ICD-10-CM

## 2019-05-19 DIAGNOSIS — L300 Nummular dermatitis: Secondary | ICD-10-CM | POA: Diagnosis not present

## 2019-05-19 DIAGNOSIS — Z85828 Personal history of other malignant neoplasm of skin: Secondary | ICD-10-CM | POA: Diagnosis not present

## 2019-06-04 DIAGNOSIS — M19211 Secondary osteoarthritis, right shoulder: Secondary | ICD-10-CM | POA: Diagnosis not present

## 2019-06-05 DIAGNOSIS — Z23 Encounter for immunization: Secondary | ICD-10-CM | POA: Diagnosis not present

## 2019-06-09 ENCOUNTER — Ambulatory Visit (INDEPENDENT_AMBULATORY_CARE_PROVIDER_SITE_OTHER): Payer: Medicare Other | Admitting: Orthopaedic Surgery

## 2019-06-09 ENCOUNTER — Encounter: Payer: Self-pay | Admitting: Orthopaedic Surgery

## 2019-06-09 ENCOUNTER — Other Ambulatory Visit: Payer: Self-pay

## 2019-06-09 ENCOUNTER — Ambulatory Visit (INDEPENDENT_AMBULATORY_CARE_PROVIDER_SITE_OTHER): Payer: Medicare Other

## 2019-06-09 DIAGNOSIS — M1711 Unilateral primary osteoarthritis, right knee: Secondary | ICD-10-CM | POA: Diagnosis not present

## 2019-06-09 NOTE — Progress Notes (Signed)
Office Visit Note   Patient: Sabrina [name]           Date of Birth: 12-21-1940           MRN: [id number] Visit Date: 06/09/2019              Requested by: Sabrina Redwood, MD [address] [place],  [place] [id number] PCP: Sabrina Redwood, MD   Assessment & Plan: Visit Diagnoses:  1. Primary osteoarthritis of right knee     Plan: Discussed with her having a cortisone injection of the knee she defers.  She had an injection in her shoulder last week and just caused her to have increased insomnia nervousness and sweating.  She does not want any type of surgical intervention.  Discussed supplemental injection in the knee she defers.  Therefore recommend she use Voltaren gel and Advil she will follow-up with [place] as needed.  Questions are encouraged and answered by Sabrina [name] and  myself  Follow-Up Instructions: Return if symptoms worsen or fail to improve.   Orders:  Orders Placed This Encounter  Procedures  . XR Knee 1-2 Views Right   No orders of the defined types were placed in this encounter.     Procedures: No procedures performed   Clinical Data: No additional findings.   Subjective: Chief Complaint  Patient presents with  . Right Knee - Pain    HPI Sabrina [name] well-known to Sabrina [name] service comes in today with right knee pain for about 6 months.  No known injury.  Feels like the knee buckle at times especially after first getting up to the ambulate after sitting.  Feels off balance.  She tried Voltaren gel and Advil.  Review of Systems Negative for fevers or chills.  Please see HPI otherwise negative  Objective: Vital Signs: There were no vitals taken for this visit.  Physical Exam Constitutional:      General: She is not in acute distress.    Appearance: She is not ill-appearing or diaphoretic.  Pulmonary:     Effort: Pulmonary effort is normal.  Neurological:     Mental Status: She is alert and oriented to person, place, and time.  Psychiatric:      Mood and Affect: Mood normal.     Ortho Exam Right knee full extension full flexion no instability valgus varus stressing.  Passive range of motion reveals significant crepitus of the right knee compared to left knee.  Tenderness along the lateral joint line and medial joint line of the right knee.  Both knees without abnormal warmth, erythema or effusion.  Specialty Comments:  No specialty comments available.  Imaging: Xr Knee 1-2 Views Right  Result Date: 06/09/2019 Right knee AP and lateral views: Shows bone-on-bone lateral compartment moderate arthritis medial compartment and severe patellofemoral changes.  No acute fractures.  Knee is well located.    PMFS History: Patient Active Problem List   Diagnosis Date Noted  . Unilateral primary osteoarthritis, left knee 11/26/2017  . Lumbar stenosis 06/18/2015  . Essential hypertension 02/20/2014  . Bradycardia 02/20/2014  . Swelling of left lower extremity 02/20/2014  . Rotator cuff arthropathy 10/16/2012  . Carpal tunnel syndrome 10/16/2012  . Carpal tunnel syndrome of right wrist 05/31/2012  . Rotator cuff impingement syndrome 12/22/2011  . Irreducible incisional hernia 03/15/2011   Past Medical History:  Diagnosis Date  . Anxiety   . Arthritis   . Asthma   . Bradycardia    symptomatic  . Cancer ([place])  COUPLE OF SKIN CANCERS  . Carpal tunnel syndrome    LEFT --NUMBNESS  . Cataracts, bilateral   . Complication of anesthesia    BP FALLS/"ANAPHYLACTIC SHOCK"/ EXTREME TREMORS;  DID  FINE WITH SURGERIES APRIL AND OCT 2013 AT [facility] SURGERIES WERE SHORT PROCEDURES--PROBLEMS WITH ANESTHESIA SEEM TO OCCUR WITH THE LONGER PROCEDURES.THE TREMORS WERE AFTER HIP REPLACEMNT 22011-ARMS WERE FLAILING--PT STATES IT TOOK 4 DOSES OF DEMEROL BEFORE THE TREMORS STOPPED.  . Depression   . History of skin cancer   . Hyperlipidemia   . Hypertension    PT HAS LOST WEIGHT--B/P NOW WITHIN NORMALS--IS ON B/P MEDICINE  . Hypothyroidism    . Myasthenia gravis    DOING WELL ON CELLCEPT-FOLLOWED BY DR. LISA [name] - [facility] NEUROLOGIST  . Osteoporosis   . Pain    LEFT SHOULDER PAIN-TORN ROTATOR CUFF-VERY LIMITED ROM  . Pain    TOP OF RIGHT SHOULDER--ROM OK AT PRESENT  . Pneumonia   . Rash, skin    LOWER ABDOMEN-FUNGAL INFECTION-PT HAS TOPICAL OINTMENT TO USE AS NEEDED.  Marland Kitchen Thyroid disease     Family History  Problem Relation Age of Onset  . Hypertension Mother   . Heart disease Mother        heart attack  . Stroke Father   . Heart disease Father        heart attack  . Breast cancer Neg Hx     Past Surgical History:  Procedure Laterality Date  . BELPHAROPTOSIS REPAIR  01/2006   PTOSIS REPAIR BILATERAL  . CARPAL TUNNEL RELEASE  05/31/2012   Procedure: CARPAL TUNNEL RELEASE;  Surgeon: Mcarthur Rossetti, MD;  Location: [facility];  Service: Orthopedics;  Laterality: Left;  Left Open Carpal Tunnel Release  . CARPAL TUNNEL RELEASE  10/10/2012   Procedure: CARPAL TUNNEL RELEASE;  Surgeon: Nita Sells, MD;  Location: [facility];  Service: Orthopedics;  Laterality: Left;  . 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 CHOLECYSTECTOMY     EXTREME DROP IN BLOOD PRESSURE WITH THIS SURGERY- YRS AGO- PT WAS 78 YRS OLD  . JOINT REPLACEMENT  10/19/09   RT HIP--SEVERE TREMORS, FLAILING OF ARMS AFTER THIS SURGERY.  Marland Kitchen left shoulder arthroscopy     . REVERSE SHOULDER ARTHROPLASTY  10/10/2012   Procedure: REVERSE SHOULDER ARTHROPLASTY;  Surgeon: Nita Sells, MD;  Location: [facility];  Service: Orthopedics;  Laterality: Left;  . TONSILLECTOMY AND ADENOIDECTOMY    . TOTAL HIP ARTHROPLASTY     right  . TUBAL LIGATION     ANAPHYLACTIC SHOCK AFTER THIS SURGERY-CAUSE THOUGHT TO  BE RELATED TO SODIUM PENTOTHAL   Social History   Occupational History  . Not on file  Tobacco Use  . Smoking status: Never Smoker  . Smokeless tobacco: Never  Used  Substance and Sexual Activity  . Alcohol use: Yes    Comment: OCCASIONAL  . Drug use: No  . Sexual activity: Not on file

## 2019-06-23 ENCOUNTER — Other Ambulatory Visit: Payer: Self-pay

## 2019-06-23 ENCOUNTER — Encounter: Payer: Self-pay | Admitting: Podiatry

## 2019-06-23 ENCOUNTER — Ambulatory Visit (INDEPENDENT_AMBULATORY_CARE_PROVIDER_SITE_OTHER): Payer: Medicare Other | Admitting: Podiatry

## 2019-06-23 DIAGNOSIS — M79674 Pain in right toe(s): Secondary | ICD-10-CM | POA: Diagnosis not present

## 2019-06-23 DIAGNOSIS — B351 Tinea unguium: Secondary | ICD-10-CM

## 2019-06-23 DIAGNOSIS — M79675 Pain in left toe(s): Secondary | ICD-10-CM | POA: Diagnosis not present

## 2019-06-23 NOTE — Patient Instructions (Signed)

## 2019-06-26 NOTE — Progress Notes (Signed)
Subjective: Sabrina [name] is seen today for follow up painful, elongated, thickened toenails 1-5 b/l feet that she cannot cut. Pain interferes with daily activities. Aggravating factor includes wearing enclosed shoe gear and relieved with periodic debridement.  Current Outpatient Medications on File Prior to Visit  Medication Sig  . albuterol (PROVENTIL HFA;VENTOLIN HFA) 108 (90 BASE) MCG/ACT inhaler Inhale 2 puffs into the lungs every 6 (six) hours as needed for wheezing.   Marland Kitchen ALPRAZolam (XANAX) 0.5 MG tablet Take 0.5 mg by mouth every 6 (six) hours.   Marland Kitchen amoxicillin (AMOXIL) 500 MG capsule TK 4 CS PO 1 HOUR BEFORE SURGERY  . anti-nausea (EMETROL) solution Take 10 mLs by mouth every 15 (fifteen) minutes as needed for nausea or vomiting.  . Ascorbic Acid (VITAMIN C) 1000 MG tablet Take 1,000 mg by mouth every morning.   . Azelastine HCl 0.15 % SOLN U 2 SPRAYS IEN 2 TO 3 TIMES A DAY FOR SINUS COG  . Calcium Carbonate (CALTRATE 600 PO) Take 600 mg by mouth 2 (two) times daily.   . Cholecalciferol (VITAMIN D-3 PO) Take 1,000 mg by mouth every morning.   Marland Kitchen FLUoxetine (PROZAC) 20 MG capsule   . FLUoxetine (PROZAC) 20 MG tablet Take 20 mg by mouth every morning.   . fluticasone (FLONASE) 50 MCG/ACT nasal spray Place 2 sprays into both nostrils daily.  . Fluticasone-Salmeterol (ADVAIR DISKUS) 250-50 MCG/DOSE AEPB Inhale 1 puff into the lungs every 12 (twelve) hours.   . Ibuprofen-Diphenhydramine Cit 200-38 MG TABS Take 1 tablet by mouth at bedtime.  Marland Kitchen levothyroxine (SYNTHROID, LEVOTHROID) 75 MCG tablet Take 75 mcg by mouth every morning. MUST TAKE BRAND NAME SYNTHROID  . montelukast (SINGULAIR) 10 MG tablet Take 10 mg by mouth at bedtime. MUST BE BRAND NAME ONLY-SINGULAIR  . Multiple Vitamin (MULITIVITAMIN WITH MINERALS) TABS Take 1 tablet by mouth daily.  . Multiple Vitamins-Minerals (CENTRUM SILVER) tablet Take by mouth.  . mycophenolate (CELLCEPT) 500 MG tablet Take 1,000 mg by mouth 2 (two) times  daily.   Marland Kitchen nystatin (MYCOSTATIN) 100000 UNIT/ML suspension SAS 5 ML B MEALS AND QHS  . Omega-3 Fatty Acids (SUPER TWIN EPA/DHA) 1250 MG CAPS Take 1 capsule by mouth daily.  . ondansetron (ZOFRAN ODT) 8 MG disintegrating tablet 8mg  ODT q8  hours prn nausea  . oxyCODONE (ROXICODONE) 5 MG immediate release tablet Take 0.5 tablets (2.5 mg total) by mouth every 4 (four) hours as needed for severe pain.  Marland Kitchen oxyCODONE-acetaminophen (PERCOCET/ROXICET) 5-325 MG tablet Take 1-2 tablets by mouth every 4 (four) hours as needed for moderate pain.  . potassium chloride (K-DUR) 10 MEQ tablet Take 2 tablets (20 mEq total) by mouth 2 (two) times daily.  . traZODone (DESYREL) 150 MG tablet Take 75 mg by mouth at bedtime.   . triamcinolone cream (KENALOG) 0.1 % APP 1 APPLICATION TO THE KNEE BID FOR 2 WEEKS PRF FLARE UPS  . valsartan-hydrochlorothiazide (DIOVAN-HCT) 160-12.5 MG tablet Take 1 tablet by mouth daily.   No current facility-administered medications on file prior to visit.      Allergies  Allergen Reactions  . Azathioprine Anaphylaxis and Other (See Comments)    Kidneys shut down  . Other Anaphylaxis    Sodium pentathol Uncoded Allergy. Allergen: Sodium pentathol  . Aminoglycosides     Tobramycin, gentamycin, kanamycin, neomycin, streptomycin Can't take because of myasthenia gravis  . Avelox [Moxifloxacin Hcl In Nacl] Other (See Comments)    Can't take because of myasthenia gravis  . Beta Adrenergic Blockers  Proponolol, timolol maleate eyedrops Can't take because of myasthenia gravis  . Calcium Channel Blockers     Blood pressure medications Can't take because of myasthenia gravis  . Cephalosporins     Can't take because of myasthenia gravis  . Ciprofloxacin Other (See Comments)    Can't take because of myasthenia gravis Can't take because of myasthenia gravis  . Clindamycin/Lincomycin     May exascerbate myasthenia gravis  . Colistin     Can't take because of myasthenia gravis  .  Fosamax [Alendronate Sodium]     LEG WEAKNESS  . Imuran [Azathioprine Sodium] Other (See Comments)    Kidneys shut down  . Iodinated Diagnostic Agents Other (See Comments)    Contraindicated to Myasthenia Gravis  . Iodine     Iodine contrast Can't take because of myasthenia gravis  . Macrolides And Ketolides     Erythromocin, azithromycin, telithromycin Can't take because of myasthenia gravis  . Magnesium-Containing Compounds     Including milk of magnesia, antacids containing magnesium hydroxide (maalox, mylanta) and epsom salts Can't take because of myasthenia gravis  . Moxifloxacin Other (See Comments)    Can't take because of myasthenia gravis  . Norfloxacin Other (See Comments)    Can't take because of myasthenia gravis Can't take because of myasthenia gravis  . Ofloxacin Other (See Comments)    Can't take because of myasthenia gravis Can't take because of myasthenia gravis  . Pefloxacin Other (See Comments)    Can't take because of myasthenia gravis Can't take because of myasthenia gravis  . Penicillamine     do not use due to Myasthenia Gravis    . Prednisolone Nausea And Vomiting  . Prednisone Nausea And Vomiting  . Procainamide     Can't take because of myasthenia gravis  . Quinidine     Can't take because of myasthenia gravis  . Quinine Derivatives     Can't take because of myasthenia gravis  . Succinylcholine     Can't take because of myasthenia gravis No paralytics  . Tubocurarine     Can't take because of myasthenia gravis  . Vecuronium     Can't take because of myasthenia gravis No paralytics  . Vicodin [Hydrocodone-Acetaminophen] Other (See Comments)    Pt suffers from bad headaches    Objective:  Vascular Examination: Capillary refill time <3 seconds x 10 digits.  Dorsalis pedis present b/l.  Posterior tibial pulses present b/l.  Digital hair absent b/l.  Skin temperature gradient WNL b/l.   Dermatological Examination: Skin with normal  turgor, texture and tone b/l.  Toenails 1-5 b/l discolored, thick, dystrophic with subungual debris and pain with palpation to nailbeds due to thickness of nails.  Musculoskeletal: Muscle strength 5/5 b/l to all LE muscle groups.  No Strahm bony deformities b/l.    Neurological Examination: Protective sensation intact with 10 gram monofilament bilaterally.  Epicritic sensation present bilaterally.  Assessment: Painful onychomycosis toenails 1-5 b/l   Plan: 1. Toenails 1-5 b/l were debrided in length and girth without iatrogenic bleeding. 2. Patient to continue soft, supportive shoe gear. 3. Patient to report any pedal injuries to medical professional immediately. 4. Follow up 3 months.  5. Patient/POA to call should there be a concern in the interim.

## 2019-06-30 ENCOUNTER — Ambulatory Visit
Admission: RE | Admit: 2019-06-30 | Discharge: 2019-06-30 | Disposition: A | Discharge status: Home / Self Care | Payer: Medicare Other | Source: Ambulatory Visit | Attending: Internal Medicine | Admitting: Internal Medicine

## 2019-06-30 ENCOUNTER — Other Ambulatory Visit: Payer: Self-pay

## 2019-06-30 DIAGNOSIS — Z1231 Encounter for screening mammogram for malignant neoplasm of breast: Secondary | ICD-10-CM | POA: Diagnosis not present

## 2019-06-30 DIAGNOSIS — H26493 Other secondary cataract, bilateral: Secondary | ICD-10-CM | POA: Diagnosis not present

## 2019-07-11 DIAGNOSIS — L821 Other seborrheic keratosis: Secondary | ICD-10-CM | POA: Diagnosis not present

## 2019-07-11 DIAGNOSIS — L300 Nummular dermatitis: Secondary | ICD-10-CM | POA: Diagnosis not present

## 2019-07-11 DIAGNOSIS — H61001 Unspecified perichondritis of right external ear: Secondary | ICD-10-CM | POA: Diagnosis not present

## 2019-07-11 DIAGNOSIS — Z85828 Personal history of other malignant neoplasm of skin: Secondary | ICD-10-CM | POA: Diagnosis not present

## 2019-09-18 DIAGNOSIS — H26491 Other secondary cataract, right eye: Secondary | ICD-10-CM | POA: Diagnosis not present

## 2019-09-29 ENCOUNTER — Ambulatory Visit (INDEPENDENT_AMBULATORY_CARE_PROVIDER_SITE_OTHER): Payer: Medicare Other | Admitting: Podiatry

## 2019-09-29 ENCOUNTER — Encounter: Payer: Self-pay | Admitting: Podiatry

## 2019-09-29 ENCOUNTER — Other Ambulatory Visit: Payer: Self-pay

## 2019-09-29 DIAGNOSIS — M79674 Pain in right toe(s): Secondary | ICD-10-CM | POA: Diagnosis not present

## 2019-09-29 DIAGNOSIS — B351 Tinea unguium: Secondary | ICD-10-CM | POA: Diagnosis not present

## 2019-09-29 DIAGNOSIS — M79675 Pain in left toe(s): Secondary | ICD-10-CM

## 2019-09-29 NOTE — Patient Instructions (Signed)

## 2019-10-04 NOTE — Progress Notes (Signed)
Subjective: Sabrina [name] presents today for follow up of painful mycotic nails b/l that are difficult to trim. Pain interferes with ambulation. Aggravating factors include wearing enclosed shoe gear. Pain is relieved with periodic professional debridement.   Allergies  Allergen Reactions  . Azathioprine Anaphylaxis and Other (See Comments)    Kidneys shut down  . Other Anaphylaxis    Sodium pentathol Uncoded Allergy. Allergen: Sodium pentathol  . Aminoglycosides     Tobramycin, gentamycin, kanamycin, neomycin, streptomycin Can't take because of myasthenia gravis  . Avelox [Moxifloxacin Hcl In Nacl] Other (See Comments)    Can't take because of myasthenia gravis  . Beta Adrenergic Blockers     Proponolol, timolol maleate eyedrops Can't take because of myasthenia gravis  . Calcium Channel Blockers     Blood pressure medications Can't take because of myasthenia gravis  . Cephalosporins     Can't take because of myasthenia gravis  . Ciprofloxacin Other (See Comments)    Can't take because of myasthenia gravis Can't take because of myasthenia gravis  . Clindamycin/Lincomycin     May exascerbate myasthenia gravis  . Colistin     Can't take because of myasthenia gravis  . Fosamax [Alendronate Sodium]     LEG WEAKNESS  . Imuran [Azathioprine Sodium] Other (See Comments)    Kidneys shut down  . Iodinated Diagnostic Agents Other (See Comments)    Contraindicated to Myasthenia Gravis  . Iodine     Iodine contrast Can't take because of myasthenia gravis  . Macrolides And Ketolides     Erythromocin, azithromycin, telithromycin Can't take because of myasthenia gravis  . Magnesium-Containing Compounds     Including milk of magnesia, antacids containing magnesium hydroxide (maalox, mylanta) and epsom salts Can't take because of myasthenia gravis  . Moxifloxacin Other (See Comments)    Can't take because of myasthenia gravis  . Norfloxacin Other (See Comments)    Can't take because of  myasthenia gravis Can't take because of myasthenia gravis  . Ofloxacin Other (See Comments)    Can't take because of myasthenia gravis Can't take because of myasthenia gravis  . Pefloxacin Other (See Comments)    Can't take because of myasthenia gravis Can't take because of myasthenia gravis  . Penicillamine     do not use due to Myasthenia Gravis    . Prednisolone Nausea And Vomiting  . Prednisone Nausea And Vomiting  . Procainamide     Can't take because of myasthenia gravis  . Quinidine     Can't take because of myasthenia gravis  . Quinine Derivatives     Can't take because of myasthenia gravis  . Succinylcholine     Can't take because of myasthenia gravis No paralytics  . Tubocurarine     Can't take because of myasthenia gravis  . Vecuronium     Can't take because of myasthenia gravis No paralytics  . Vicodin [Hydrocodone-Acetaminophen] Other (See Comments)    Pt suffers from bad headaches     Objective: There were no vitals filed for this visit.  Vascular Examination:  Capillary fill time to digits <3s b/l, palpable DP pulses b/l, palpable PT pulses b/l, pedal hair absent b/l and skin temperature gradient within normal limits b/l  Dermatological Examination: Pedal skin with normal turgor, texture and tone bilaterally, no open wounds bilaterally, no interdigital macerations bilaterally and toenails 1-5 b/l elongated, dystrophic, thickened, crumbly with subungual debris  Musculoskeletal: Normal muscle strength 5/5 to all lower extremity muscle groups bilaterally, no Demasi bony deformities  bilaterally and no pain crepitus or joint limitation noted with ROM b/l  Neurological: Protective sensation intact 5/5 intact bilaterally with 10g monofilament b/l and vibratory sensation intact b/l  Assessment: 1. Pain due to onychomycosis of toenails of both feet      Plan: -Toenails 1-5 b/l were debrided in length and girth without iatrogenic bleeding. -Patient to continue  soft, supportive shoe gear daily. -Patient to report any pedal injuries to medical professional immediately. -Patient/POA to call should there be question/concern in the interim.  Return in about 3 months (around 12/28/2019) for nail trim.

## 2019-10-16 DIAGNOSIS — H26492 Other secondary cataract, left eye: Secondary | ICD-10-CM | POA: Diagnosis not present

## 2019-11-14 ENCOUNTER — Ambulatory Visit: Payer: Medicare Other | Attending: Internal Medicine

## 2019-11-14 DIAGNOSIS — Z23 Encounter for immunization: Secondary | ICD-10-CM

## 2019-11-14 NOTE — Progress Notes (Signed)
   Covid-19 Vaccination Clinic  Name:  [name]    MRN: [id number] DOB: Mar 10, 1941  11/14/2019  [name] was observed post Covid-19 immunization for 30 minutes based on pre-vaccination screening without incident. She was provided with Vaccine Information Sheet and instruction to access the V-Safe system.   [name] was instructed to call 911 with any severe reactions post vaccine: Marland Kitchen Difficulty breathing  . Swelling of face and throat  . A fast heartbeat  . A bad rash all over body  . Dizziness and weakness   Immunizations Administered    Name Date Dose VIS Date Route   Pfizer COVID-19 Vaccine 11/14/2019 11:43 AM 0.3 mL 08/15/2019 Intramuscular   Manufacturer: [place]   Lot: [id number]   [place]: [id number]

## 2019-12-08 ENCOUNTER — Ambulatory Visit: Payer: Medicare Other | Attending: Internal Medicine

## 2019-12-08 DIAGNOSIS — Z23 Encounter for immunization: Secondary | ICD-10-CM

## 2019-12-08 NOTE — Progress Notes (Signed)
   Covid-19 Vaccination Clinic  Name:  Sabrina [name]    MRN: [id number] DOB: 1941/06/13  12/08/2019  [name] was observed post Covid-19 immunization for 15 minutes without incident. She was provided with Vaccine Information Sheet and instruction to access the V-Safe system.   [name] was instructed to call 911 with any severe reactions post vaccine: Marland Kitchen Difficulty breathing  . Swelling of face and throat  . A fast heartbeat  . A bad rash all over body  . Dizziness and weakness   Immunizations Administered    Name Date Dose VIS Date Route   Pfizer COVID-19 Vaccine 12/08/2019  3:27 PM 0.3 mL 08/15/2019 Intramuscular   Manufacturer: [place]   Lot: [id number]   [place]: [id number]

## 2019-12-29 ENCOUNTER — Ambulatory Visit (INDEPENDENT_AMBULATORY_CARE_PROVIDER_SITE_OTHER): Payer: Medicare Other | Admitting: Podiatry

## 2019-12-29 ENCOUNTER — Other Ambulatory Visit: Payer: Self-pay

## 2019-12-29 ENCOUNTER — Encounter: Payer: Self-pay | Admitting: Podiatry

## 2019-12-29 VITALS — Temp 98.2°F

## 2019-12-29 DIAGNOSIS — M79674 Pain in right toe(s): Secondary | ICD-10-CM

## 2019-12-29 DIAGNOSIS — M79675 Pain in left toe(s): Secondary | ICD-10-CM | POA: Diagnosis not present

## 2019-12-29 DIAGNOSIS — B351 Tinea unguium: Secondary | ICD-10-CM | POA: Diagnosis not present

## 2019-12-29 NOTE — Patient Instructions (Signed)

## 2020-01-02 NOTE — Progress Notes (Signed)
Subjective:  Sabrina [name] presents to clinic today with cc of  painful, thick, discolored, elongated toenails  of both feet that become tender and patient cannot cut because of thickness. Pain is aggravated when wearing enclosed shoe gear and relieved with periodic professional debridement.  Patient voices no new pedal concerns on today's visit.  Medications reviewed in chart.  Allergies  Allergen Reactions  . Azathioprine Anaphylaxis and Other (See Comments)    Kidneys shut down  . Other Anaphylaxis    Sodium pentathol Uncoded Allergy. Allergen: Sodium pentathol  . Aminoglycosides     Tobramycin, gentamycin, kanamycin, neomycin, streptomycin Can't take because of myasthenia gravis  . Avelox [Moxifloxacin Hcl In Nacl] Other (See Comments)    Can't take because of myasthenia gravis  . Beta Adrenergic Blockers     Proponolol, timolol maleate eyedrops Can't take because of myasthenia gravis  . Calcium Channel Blockers     Blood pressure medications Can't take because of myasthenia gravis  . Cephalosporins     Can't take because of myasthenia gravis  . Ciprofloxacin Other (See Comments)    Can't take because of myasthenia gravis Can't take because of myasthenia gravis  . Clindamycin/Lincomycin     May exascerbate myasthenia gravis  . Colistin     Can't take because of myasthenia gravis  . Fosamax [Alendronate Sodium]     LEG WEAKNESS  . Imuran [Azathioprine Sodium] Other (See Comments)    Kidneys shut down  . Iodinated Diagnostic Agents Other (See Comments)    Contraindicated to Myasthenia Gravis  . Iodine     Iodine contrast Can't take because of myasthenia gravis  . Macrolides And Ketolides     Erythromocin, azithromycin, telithromycin Can't take because of myasthenia gravis  . Magnesium-Containing Compounds     Including milk of magnesia, antacids containing magnesium hydroxide (maalox, mylanta) and epsom salts Can't take because of myasthenia gravis  . Moxifloxacin  Other (See Comments)    Can't take because of myasthenia gravis  . Norfloxacin Other (See Comments)    Can't take because of myasthenia gravis Can't take because of myasthenia gravis  . Ofloxacin Other (See Comments)    Can't take because of myasthenia gravis Can't take because of myasthenia gravis  . Pefloxacin Other (See Comments)    Can't take because of myasthenia gravis Can't take because of myasthenia gravis  . Penicillamine     do not use due to Myasthenia Gravis    . Prednisolone Nausea And Vomiting  . Prednisone Nausea And Vomiting  . Procainamide     Can't take because of myasthenia gravis  . Quinidine     Can't take because of myasthenia gravis  . Quinine Derivatives     Can't take because of myasthenia gravis  . Succinylcholine     Can't take because of myasthenia gravis No paralytics  . Tubocurarine     Can't take because of myasthenia gravis  . Vecuronium     Can't take because of myasthenia gravis No paralytics  . Vicodin [Hydrocodone-Acetaminophen] Other (See Comments)    Pt suffers from bad headaches     Objective:  Physical Examination:  Vascular Examination: Capillary refill time to digits immediate b/l. Palpable DP pulses b/l. Palpable PT pulses b/l. Pedal hair absent b/l Skin temperature gradient within normal limits b/l. No edema noted b/l.  Dermatological Examination: Pedal skin with normal turgor, texture and tone bilaterally. No open wounds bilaterally. No interdigital macerations bilaterally. Toenails 1-5 b/l elongated, dystrophic, thickened, crumbly with  subungual debris and tenderness to dorsal palpation.  Musculoskeletal Examination: Normal muscle strength 5/5 to all lower extremity muscle groups bilaterally. No Setters bony deformities bilaterally. No pain crepitus or joint limitation noted with ROM b/l.  Neurological Examination: Protective sensation intact 5/5 intact bilaterally with 10g monofilament b/l. Vibratory sensation intact b/l.  Proprioception intact bilaterally.  Assessment: 1. Pain due to onychomycosis of toenails of both feet    Plan: -Toenails 1-5 b/l were debrided in length and girth with sterile nail nippers and dremel without iatrogenic bleeding.  -Patient to continue soft, supportive shoe gear daily. -Patient to report any pedal injuries to medical professional immediately. -Patient/POA to call should there be question/concern in the interim.

## 2020-01-29 DIAGNOSIS — M25561 Pain in right knee: Secondary | ICD-10-CM | POA: Diagnosis not present

## 2020-01-29 DIAGNOSIS — M545 Low back pain: Secondary | ICD-10-CM | POA: Diagnosis not present

## 2020-03-18 DIAGNOSIS — R82998 Other abnormal findings in urine: Secondary | ICD-10-CM | POA: Diagnosis not present

## 2020-03-18 DIAGNOSIS — I10 Essential (primary) hypertension: Secondary | ICD-10-CM | POA: Diagnosis not present

## 2020-03-31 ENCOUNTER — Ambulatory Visit: Payer: Medicare Other | Admitting: Podiatry

## 2020-06-06 NOTE — Progress Notes (Signed)
Electrophysiology Office Note:    Date:  06/07/2020   ID:  Sabrina [name], Sabrina [name] 12-30-1940, MRN [id number]  PCP:  Marton Redwood, MD  [place] Cardiologist:  No primary care provider on file.  [facility] Electrophysiologist:  None   Referring MD: Marton Redwood, MD   Chief Complaint: low HRs  History of Present Illness:    Sabrina [name] is a 79 y.o. female who presents for an evaluation of low heart rates at the request of Dr Brigitte Pulse. Their medical history includes HTN, HLD, hypothryoidism. Today she tells me that she takes her HR at home and it ranges from upper 50s to 70s. No syncope or presyncope. She does report feeling tired frequently. She is here today with her daughter.  Past Medical History:  Diagnosis Date  . Anxiety   . Arthritis   . Asthma   . 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 Bradycardia    symptomatic  . Cancer ([place])    COUPLE OF SKIN CANCERS  . Carpal tunnel syndrome    LEFT --NUMBNESS  . Cataracts, bilateral   . Cerebral aneurysm   . Complication of anesthesia    BP FALLS/"ANAPHYLACTIC SHOCK"/ EXTREME TREMORS;  DID  FINE WITH SURGERIES APRIL AND OCT 2013 AT [facility] SURGERIES WERE SHORT PROCEDURES--PROBLEMS WITH ANESTHESIA SEEM TO OCCUR WITH THE LONGER PROCEDURES.THE TREMORS WERE AFTER HIP REPLACEMNT 22011-ARMS WERE FLAILING--PT STATES IT TOOK 4 DOSES OF DEMEROL BEFORE THE TREMORS STOPPED.  . Depression   . History of skin cancer   . HSV (herpes simplex virus) infection    CHEST  . Hyperlipidemia   . Hypertension    PT HAS LOST WEIGHT--B/P NOW WITHIN NORMALS--IS ON B/P MEDICINE  . Hypertriglyceridemia   . Hypothyroidism   . Myasthenia gravis    DOING WELL ON CELLCEPT-FOLLOWED BY DR. LISA HOBSON - [facility] NEUROLOGIST  . OA (osteoarthritis) of hip   . Osteopenia   . Osteoporosis   . Pain    LEFT SHOULDER PAIN-TORN ROTATOR CUFF-VERY LIMITED ROM  . Pain    TOP OF RIGHT SHOULDER--ROM OK AT PRESENT  . Pneumonia   . Rash, skin    LOWER ABDOMEN-FUNGAL  INFECTION-PT HAS TOPICAL OINTMENT TO USE AS NEEDED.  . Seasonal allergies   . Thyroid disease     Past Surgical History:  Procedure Laterality Date  . BELPHAROPTOSIS REPAIR  01/2006   PTOSIS REPAIR BILATERAL  . CARPAL TUNNEL RELEASE  05/31/2012   Procedure: CARPAL TUNNEL RELEASE;  Surgeon: Mcarthur Rossetti, MD;  Location: [facility];  Service: Orthopedics;  Laterality: Left;  Left Open Carpal Tunnel Release  . CARPAL TUNNEL RELEASE  10/10/2012   Procedure: CARPAL TUNNEL RELEASE;  Surgeon: Nita Sells, MD;  Location: [facility];  Service: Orthopedics;  Laterality: Left;  . 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 CHOLECYSTECTOMY     EXTREME DROP IN BLOOD PRESSURE WITH THIS SURGERY- YRS AGO- PT WAS 79 YRS OLD  . JOINT REPLACEMENT  10/19/09   RT HIP--SEVERE TREMORS, FLAILING OF ARMS AFTER THIS SURGERY.  Marland Kitchen left shoulder arthroscopy     . REVERSE SHOULDER ARTHROPLASTY  10/10/2012   Procedure: REVERSE SHOULDER ARTHROPLASTY;  Surgeon: Nita Sells, MD;  Location: [facility];  Service: Orthopedics;  Laterality: Left;  . TONSILLECTOMY AND ADENOIDECTOMY    . TOTAL HIP ARTHROPLASTY     right  . TUBAL LIGATION  ANAPHYLACTIC SHOCK AFTER THIS SURGERY-CAUSE THOUGHT TO  BE RELATED TO SODIUM PENTOTHAL    Current Medications: Current Meds  Medication Sig  . albuterol (PROVENTIL HFA;VENTOLIN HFA) 108 (90 BASE) MCG/ACT inhaler Inhale 2 puffs into the lungs every 6 (six) hours as needed for wheezing.   Marland Kitchen ALPRAZolam (XANAX) 0.5 MG tablet Take 0.5 mg by mouth every 6 (six) hours.   Marland Kitchen amoxicillin (AMOXIL) 500 MG capsule TK 4 CS PO 1 HOUR BEFORE SURGERY  . Ascorbic Acid (VITAMIN C) 1000 MG tablet Take 1,000 mg by mouth every morning.   . Cholecalciferol (VITAMIN D-3 PO) Take 1,000 mg by mouth every morning.   Marland Kitchen FLUoxetine (PROZAC) 20 MG capsule daily.   Marland Kitchen FLUoxetine (PROZAC) 20 MG tablet Take 20 mg by  mouth every morning.   . fluticasone (FLONASE) 50 MCG/ACT nasal spray Place 2 sprays into both nostrils daily.  . Fluticasone-Salmeterol (ADVAIR DISKUS) 250-50 MCG/DOSE AEPB Inhale 1 puff into the lungs every 12 (twelve) hours.   . Ibuprofen-Diphenhydramine Cit 200-38 MG TABS Take 1 tablet by mouth at bedtime.  Marland Kitchen levothyroxine (SYNTHROID, LEVOTHROID) 75 MCG tablet Take 75 mcg by mouth every morning. MUST TAKE BRAND NAME SYNTHROID  . methocarbamol (ROBAXIN) 500 MG tablet Take 500 mg by mouth as needed.  . Misc Natural Products (T-RELIEF CBD+13) SUBL Place under the tongue as needed (pain).  . montelukast (SINGULAIR) 10 MG tablet Take 10 mg by mouth at bedtime. MUST BE BRAND NAME ONLY-SINGULAIR  . Multiple Vitamins-Minerals (CENTRUM SILVER) tablet Take by mouth.  . mycophenolate (CELLCEPT) 500 MG tablet Take 1,000 mg by mouth 2 (two) times daily.   . traZODone (DESYREL) 150 MG tablet Take 75 mg by mouth at bedtime.   . valsartan-hydrochlorothiazide (DIOVAN-HCT) 160-12.5 MG tablet Take 1 tablet by mouth daily.     Allergies:   Azathioprine, Other, Aminoglycosides, Avelox [moxifloxacin hcl in nacl], Beta adrenergic blockers, Calcium channel blockers, Cephalosporins, Ciprofloxacin, Clindamycin/lincomycin, Colistin, Fosamax [alendronate sodium], Imuran [azathioprine sodium], Iodinated diagnostic agents, Iodine, Macrolides and ketolides, Magnesium-containing compounds, Moxifloxacin, Norfloxacin, Ofloxacin, Pefloxacin, Penicillamine, Prednisolone, Prednisone, Procainamide, Quinidine, Quinine derivatives, Succinylcholine, Tubocurarine, Vecuronium, and Vicodin [hydrocodone-acetaminophen]   Social History   Socioeconomic History  . Marital status: Single    Spouse name: Not on file  . Number of children: Not on file  . Years of education: Not on file  . Highest education level: Not on file  Occupational History  . Not on file  Tobacco Use  . Smoking status: Never Smoker  . Smokeless tobacco: Never  Used  Substance and Sexual Activity  . Alcohol use: Yes    Comment: OCCASIONAL  . Drug use: No  . Sexual activity: Not on file  Other Topics Concern  . Not on file  Social History Narrative  . Not on file   Social Determinants of Health   Financial Resource Strain:   . Difficulty of Paying Living Expenses: Not on file  Food Insecurity:   . Worried About Charity fundraiser in the Last Year: Not on file  . Ran Out of Food in the Last Year: Not on file  Transportation Needs:   . Lack of Transportation (Medical): Not on file  . Lack of Transportation (Non-Medical): Not on file  Physical Activity:   . Days of Exercise per Week: Not on file  . Minutes of Exercise per Session: Not on file  Stress:   . Feeling of Stress : Not on file  Social Connections:   . Frequency of Communication  with Friends and Family: Not on file  . Frequency of Social Gatherings with Friends and Family: Not on file  . Attends Religious Services: Not on file  . Active Member of Clubs or Organizations: Not on file  . Attends Archivist Meetings: Not on file  . Marital Status: Not on file     Family History: The patient's family history includes Heart disease in her father and mother; Hypertension in her mother; Stroke in her father. There is no history of Breast cancer.  ROS:   Please see the history of present illness.    All other systems reviewed and are negative.  EKGs/Labs/Other Studies Reviewed:    The following studies were reviewed today: Outside records, ECG  02/24/2020 records/labs: TSH 1.44  EKG:  The ekg ordered today demonstrates sinus rhythm at 72bpm.  Recent Labs: No results found for requested labs within last 8760 hours.  Recent Lipid Panel No results found for: CHOL, TRIG, HDL, CHOLHDL, VLDL, LDLCALC, LDLDIRECT  Physical Exam:    VS:  BP 130/68   Pulse 73   Ht 5\' 5"  (1.651 m)   Wt 185 lb (83.9 kg)   SpO2 97%   BMI 30.79 kg/m     Wt Readings from Last 3  Encounters:  06/07/20 185 lb (83.9 kg)  10/23/17 185 lb (83.9 kg)  06/18/15 186 lb 6 oz (84.5 kg)     GEN:  Well nourished, well developed in no acute distress HEENT: Normal NECK: No JVD; No carotid bruits LYMPHATICS: No lymphadenopathy CARDIAC: RRR, no murmurs, rubs, gallops RESPIRATORY:  Clear to auscultation without rales, wheezing or rhonchi  ABDOMEN: Soft, non-tender, non-distended MUSCULOSKELETAL:  No edema; No deformity  SKIN: Warm and dry NEUROLOGIC:  Alert and oriented x 3 PSYCHIATRIC:  Normal affect   ASSESSMENT:    1. Bradycardia   2. Essential hypertension    PLAN:    In order of problems listed above:  1. Bradycardia Ms Gosch has normal heart rate variability although does report some episodes of HR 50s while at rest. Today it is in the 70s after walking in from the waiting area. No syncope/presyncope. She is on no AV nodal blockers or other medications that impact the sinus node rate.  Discussed with her and her daughter that her HR variability seemed to represent normal physiology and that pacemaker therapy was not indicated. We discussed the risks associated with PPM implant. Plan to follow up as needed.  2. HTN Under good control today. Continue current regimen.   Medication Adjustments/Labs and Tests Ordered: Current medicines are reviewed at length with the patient today.  Concerns regarding medicines are outlined above.  Orders Placed This Encounter  Procedures  . EKG 12-Lead   No orders of the defined types were placed in this encounter.    Signed, Lars Mage, MD, [facility]  06/07/2020 1:47 PM    Electrophysiology [facility]

## 2020-06-07 ENCOUNTER — Ambulatory Visit (INDEPENDENT_AMBULATORY_CARE_PROVIDER_SITE_OTHER): Payer: Medicare Other | Admitting: Cardiology

## 2020-06-07 ENCOUNTER — Other Ambulatory Visit: Payer: Self-pay

## 2020-06-07 VITALS — BP 130/68 | HR 73 | Ht 65.0 in | Wt 185.0 lb

## 2020-06-07 DIAGNOSIS — I10 Essential (primary) hypertension: Secondary | ICD-10-CM

## 2020-06-07 DIAGNOSIS — R001 Bradycardia, unspecified: Secondary | ICD-10-CM | POA: Diagnosis not present

## 2020-06-07 NOTE — Patient Instructions (Addendum)
Medication Instructions:  Your physician recommends that you continue on your current medications as directed. Please refer to the Current Medication list given to you today.  Labwork: None ordered.  Testing/Procedures: None ordered.  Follow-Up: Your physician wants you to follow-up in: as needed with Dr. Lambert.    Any Other Special Instructions Will Be Listed Below (If Applicable).  If you need a refill on your cardiac medications before your next appointment, please call your pharmacy.   

## 2020-06-17 DIAGNOSIS — Z23 Encounter for immunization: Secondary | ICD-10-CM | POA: Diagnosis not present

## 2020-06-30 ENCOUNTER — Ambulatory Visit (INDEPENDENT_AMBULATORY_CARE_PROVIDER_SITE_OTHER): Payer: Medicare Other | Admitting: Podiatry

## 2020-06-30 ENCOUNTER — Other Ambulatory Visit: Payer: Self-pay

## 2020-06-30 ENCOUNTER — Encounter: Payer: Self-pay | Admitting: Podiatry

## 2020-06-30 DIAGNOSIS — B351 Tinea unguium: Secondary | ICD-10-CM | POA: Diagnosis not present

## 2020-06-30 DIAGNOSIS — M79674 Pain in right toe(s): Secondary | ICD-10-CM

## 2020-06-30 DIAGNOSIS — M79675 Pain in left toe(s): Secondary | ICD-10-CM | POA: Diagnosis not present

## 2020-07-03 NOTE — Progress Notes (Signed)
Subjective:  Sabrina [name] presents to clinic today with cc of  painful, thick, discolored, elongated toenails  of both feet that become tender and patient cannot cut because of thickness. Pain is aggravated when wearing enclosed shoe gear and relieved with periodic professional debridement.  Patient voices no new pedal concerns on today's visit.  Allergies  Allergen Reactions  . Azathioprine Anaphylaxis and Other (See Comments)    Kidneys shut down  . Other Anaphylaxis    Sodium pentathol Uncoded Allergy. Allergen: Sodium pentathol  . Aminoglycosides     Tobramycin, gentamycin, kanamycin, neomycin, streptomycin Can't take because of myasthenia gravis  . Avelox [Moxifloxacin Hcl In Nacl] Other (See Comments)    Can't take because of myasthenia gravis  . Beta Adrenergic Blockers     Proponolol, timolol maleate eyedrops Can't take because of myasthenia gravis  . Calcium Channel Blockers     Blood pressure medications Can't take because of myasthenia gravis  . Cephalosporins     Can't take because of myasthenia gravis  . Ciprofloxacin Other (See Comments)    Can't take because of myasthenia gravis Can't take because of myasthenia gravis  . Clindamycin/Lincomycin     May exascerbate myasthenia gravis  . Colistin     Can't take because of myasthenia gravis  . Fosamax [Alendronate Sodium]     LEG WEAKNESS  . Imuran [Azathioprine Sodium] Other (See Comments)    Kidneys shut down  . Iodinated Diagnostic Agents Other (See Comments)    Contraindicated to Myasthenia Gravis  . Iodine     Iodine contrast Can't take because of myasthenia gravis  . Macrolides And Ketolides     Erythromocin, azithromycin, telithromycin Can't take because of myasthenia gravis  . Magnesium-Containing Compounds     Including milk of magnesia, antacids containing magnesium hydroxide (maalox, mylanta) and epsom salts Can't take because of myasthenia gravis  . Moxifloxacin Other (See Comments)    Can't  take because of myasthenia gravis  . Norfloxacin Other (See Comments)    Can't take because of myasthenia gravis Can't take because of myasthenia gravis  . Ofloxacin Other (See Comments)    Can't take because of myasthenia gravis Can't take because of myasthenia gravis  . Pefloxacin Other (See Comments)    Can't take because of myasthenia gravis Can't take because of myasthenia gravis  . Penicillamine     do not use due to Myasthenia Gravis    . Prednisolone Nausea And Vomiting  . Prednisone Nausea And Vomiting  . Procainamide     Can't take because of myasthenia gravis  . Quinidine     Can't take because of myasthenia gravis  . Quinine Derivatives     Can't take because of myasthenia gravis  . Succinylcholine     Can't take because of myasthenia gravis No paralytics  . Tubocurarine     Can't take because of myasthenia gravis  . Vecuronium     Can't take because of myasthenia gravis No paralytics  . Vicodin [Hydrocodone-Acetaminophen] Other (See Comments)    Pt suffers from bad headaches     Objective:  Physical Examination:  Vascular Examination: Capillary refill time to digits immediate b/l. Palpable DP pulses b/l. Palpable PT pulses b/l. Pedal hair absent b/l Skin temperature gradient within normal limits b/l. No edema noted b/l.  Dermatological Examination: Pedal skin with normal turgor, texture and tone bilaterally. No open wounds bilaterally. No interdigital macerations bilaterally. Toenails 1-5 b/l elongated, dystrophic, thickened, crumbly with subungual debris and tenderness to  dorsal palpation.  Musculoskeletal Examination: Normal muscle strength 5/5 to all lower extremity muscle groups bilaterally. No Banker bony deformities bilaterally. No pain crepitus or joint limitation noted with ROM b/l.  Neurological Examination: Protective sensation intact 5/5 intact bilaterally with 10g monofilament b/l. Vibratory sensation intact b/l. Proprioception intact  bilaterally.  Assessment: 1. Pain due to onychomycosis of toenails of both feet    Plan: -Examined patient. -No new findings. No new orders. -Toenails 1-5 b/l were debrided in length and girth with sterile nail nippers and dremel without iatrogenic bleeding.  -Patient to report any pedal injuries to medical professional immediately. -Patient to continue soft, supportive shoe gear daily. -Patient/POA to call should there be question/concern in the interim.

## 2020-08-09 DIAGNOSIS — Z23 Encounter for immunization: Secondary | ICD-10-CM | POA: Diagnosis not present

## 2020-10-08 ENCOUNTER — Other Ambulatory Visit: Payer: Self-pay

## 2020-10-08 ENCOUNTER — Ambulatory Visit (INDEPENDENT_AMBULATORY_CARE_PROVIDER_SITE_OTHER): Payer: Medicare Other | Admitting: Podiatry

## 2020-10-08 DIAGNOSIS — M79674 Pain in right toe(s): Secondary | ICD-10-CM

## 2020-10-08 DIAGNOSIS — M79675 Pain in left toe(s): Secondary | ICD-10-CM | POA: Diagnosis not present

## 2020-10-08 DIAGNOSIS — B351 Tinea unguium: Secondary | ICD-10-CM

## 2020-10-14 ENCOUNTER — Encounter: Payer: Self-pay | Admitting: Podiatry

## 2020-10-14 NOTE — Progress Notes (Signed)
Subjective:  Sabrina [name] presents to clinic today with cc of  painful, thick, discolored, elongated toenails  of both feet that become tender and patient cannot cut because of thickness. Pain is aggravated when wearing enclosed shoe gear and relieved with periodic professional debridement.  Her PCP is Dr. Marton Redwood and last visit was 03/18/2020.  Allergies  Allergen Reactions  . Azathioprine Anaphylaxis and Other (See Comments)    Kidneys shut down  . Other Anaphylaxis    Sodium pentathol Uncoded Allergy. Allergen: Sodium pentathol  . Aminoglycosides     Tobramycin, gentamycin, kanamycin, neomycin, streptomycin Can't take because of myasthenia gravis  . Avelox [Moxifloxacin Hcl In Nacl] Other (See Comments)    Can't take because of myasthenia gravis  . Beta Adrenergic Blockers     Proponolol, timolol maleate eyedrops Can't take because of myasthenia gravis  . Calcium Channel Blockers     Blood pressure medications Can't take because of myasthenia gravis  . Cephalosporins     Can't take because of myasthenia gravis  . Ciprofloxacin Other (See Comments)    Can't take because of myasthenia gravis Can't take because of myasthenia gravis  . Clindamycin/Lincomycin     May exascerbate myasthenia gravis  . Colistin     Can't take because of myasthenia gravis  . Fosamax [Alendronate Sodium]     LEG WEAKNESS  . Imuran [Azathioprine Sodium] Other (See Comments)    Kidneys shut down  . Iodinated Diagnostic Agents Other (See Comments)    Contraindicated to Myasthenia Gravis  . Iodine     Iodine contrast Can't take because of myasthenia gravis  . Macrolides And Ketolides     Erythromocin, azithromycin, telithromycin Can't take because of myasthenia gravis  . Magnesium-Containing Compounds     Including milk of magnesia, antacids containing magnesium hydroxide (maalox, mylanta) and epsom salts Can't take because of myasthenia gravis  . Moxifloxacin Other (See Comments)    Can't  take because of myasthenia gravis  . Norfloxacin Other (See Comments)    Can't take because of myasthenia gravis Can't take because of myasthenia gravis  . Ofloxacin Other (See Comments)    Can't take because of myasthenia gravis Can't take because of myasthenia gravis  . Pefloxacin Other (See Comments)    Can't take because of myasthenia gravis Can't take because of myasthenia gravis  . Penicillamine     do not use due to Myasthenia Gravis    . Prednisolone Nausea And Vomiting  . Prednisone Nausea And Vomiting  . Procainamide     Can't take because of myasthenia gravis  . Quinidine     Can't take because of myasthenia gravis  . Quinine Derivatives     Can't take because of myasthenia gravis  . Succinylcholine     Can't take because of myasthenia gravis No paralytics  . Tubocurarine     Can't take because of myasthenia gravis  . Vecuronium     Can't take because of myasthenia gravis No paralytics  . Vicodin [Hydrocodone-Acetaminophen] Other (See Comments)    Pt suffers from bad headaches    Objective: Vascular Examination: Capillary refill time to digits immediate b/l. Palpable DP pulses b/l. Palpable PT pulses b/l. Pedal hair absent b/l Skin temperature gradient within normal limits b/l. No edema noted b/l.  Dermatological Examination: Pedal skin with normal turgor, texture and tone bilaterally. No open wounds bilaterally. No interdigital macerations bilaterally. Toenails 1-5 b/l elongated, dystrophic, thickened, crumbly with subungual debris and tenderness to dorsal palpation.  Musculoskeletal Examination: Normal muscle strength 5/5 to all lower extremity muscle groups bilaterally. No Wojnarowski bony deformities bilaterally. No pain crepitus or joint limitation noted with ROM b/l.  Neurological Examination: Protective sensation intact 5/5 intact bilaterally with 10g monofilament b/l. Vibratory sensation intact b/l. Proprioception intact bilaterally.  Assessment: 1. Pain  due to onychomycosis of toenails of both feet    Plan: -Examined patient. -No new findings. No new orders. -Toenails 1-5 b/l were debrided in length and girth with sterile nail nippers and dremel without iatrogenic bleeding.  -Patient to report any pedal injuries to medical professional immediately. -Patient to continue soft, supportive shoe gear daily. -Patient/POA to call should there be question/concern in the interim.

## 2020-10-23 ENCOUNTER — Telehealth: Payer: Self-pay | Admitting: Unknown Physician Specialty

## 2020-10-23 NOTE — Telephone Encounter (Signed)
Called to discuss with patient about COVID-19 symptoms and the use of one of the available treatments for those with mild to moderate Covid symptoms and at a high risk of hospitalization.  Pt appears to qualify for outpatient treatment due to co-morbid conditions and/or a member of an at-risk group in accordance with the FDA Emergency Use Authorization.     Unable to reach pt - Phone rings without answer and no mychart. Alternate contact number disconnected  Kathrine Haddock

## 2020-10-24 ENCOUNTER — Other Ambulatory Visit: Payer: Self-pay | Admitting: Unknown Physician Specialty

## 2020-10-24 ENCOUNTER — Telehealth: Payer: Self-pay | Admitting: Unknown Physician Specialty

## 2020-10-24 DIAGNOSIS — Z79899 Other long term (current) drug therapy: Secondary | ICD-10-CM

## 2020-10-24 DIAGNOSIS — U071 COVID-19: Secondary | ICD-10-CM

## 2020-10-24 DIAGNOSIS — I10 Essential (primary) hypertension: Secondary | ICD-10-CM

## 2020-10-24 DIAGNOSIS — D84821 Immunodeficiency due to drugs: Secondary | ICD-10-CM

## 2020-10-24 NOTE — Telephone Encounter (Signed)
I connected by phone with Sabrina [name] on 10/24/2020 at 9:27 AM to discuss the potential use of a new treatment for mild to moderate COVID-19 viral infection in non-hospitalized patients.  This patient is a 80 y.o. female that meets the FDA criteria for Emergency Use Authorization of COVID monoclonal antibody sotrovimab.  Has a (+) direct SARS-CoV-2 viral test result  Has mild or moderate COVID-19   Is NOT hospitalized due to COVID-19  Is within 10 days of symptom onset  Has at least one of the high risk factor(s) for progression to severe COVID-19 and/or hospitalization as defined in EUA.  Specific high risk criteria : Older age (>/= 81 yo), BMI > 25, Immunosuppressive Disease or Treatment, Cardiovascular disease or hypertension and Chronic Lung Disease   I have spoken and communicated the following to the patient or parent/caregiver regarding COVID monoclonal antibody treatment:  1. FDA has authorized the emergency use for the treatment of mild to moderate COVID-19 in adults and pediatric patients with positive results of direct SARS-CoV-2 viral testing who are 69 years of age and older weighing at least 40 kg, and who are at high risk for progressing to severe COVID-19 and/or hospitalization.  2. The significant known and potential risks and benefits of COVID monoclonal antibody, and the extent to which such potential risks and benefits are unknown.  3. Information on available alternative treatments and the risks and benefits of those alternatives, including clinical trials.  4. Patients treated with COVID monoclonal antibody should continue to self-isolate and use infection control measures (e.g., wear mask, isolate, social distance, avoid sharing personal items, clean and disinfect "high touch" surfaces, and frequent handwashing) according to CDC guidelines.   5. The patient or parent/caregiver has the option to accept or refuse COVID monoclonal antibody treatment.  After reviewing  this information with the patient, the patient has agreed to receive one of the available covid 19 monoclonal antibodies and will be provided an appropriate fact sheet prior to infusion. Kathrine Haddock, NP 10/24/2020 9:27 AM Sx onset 2/17

## 2020-10-25 ENCOUNTER — Ambulatory Visit (HOSPITAL_COMMUNITY)
Admission: RE | Admit: 2020-10-25 | Discharge: 2020-10-25 | Disposition: A | Discharge status: Home / Self Care | Payer: Medicare Other | Source: Ambulatory Visit | Attending: Pulmonary Disease | Admitting: Pulmonary Disease

## 2020-10-25 DIAGNOSIS — U071 COVID-19: Secondary | ICD-10-CM | POA: Diagnosis not present

## 2020-10-25 DIAGNOSIS — I10 Essential (primary) hypertension: Secondary | ICD-10-CM | POA: Diagnosis not present

## 2020-10-25 DIAGNOSIS — Z79899 Other long term (current) drug therapy: Secondary | ICD-10-CM | POA: Diagnosis not present

## 2020-10-25 DIAGNOSIS — D84821 Immunodeficiency due to drugs: Secondary | ICD-10-CM | POA: Insufficient documentation

## 2020-10-25 MED ORDER — EPINEPHRINE 0.3 MG/0.3ML IJ SOAJ: 0.3000 mg | Freq: Once | INTRAMUSCULAR | Status: DC | PRN

## 2020-10-25 MED ORDER — SOTROVIMAB 500 MG/8ML IV SOLN
500.0000 mg | Freq: Once | INTRAVENOUS | Status: AC
  Administered 2020-10-25: 500 mg via INTRAVENOUS

## 2020-10-25 MED ORDER — ALBUTEROL SULFATE HFA 108 (90 BASE) MCG/ACT IN AERS: 2.0000 | INHALATION_SPRAY | Freq: Once | RESPIRATORY_TRACT | Status: DC | PRN

## 2020-10-25 MED ORDER — FAMOTIDINE IN NACL 20-0.9 MG/50ML-% IV SOLN: 20.0000 mg | Freq: Once | INTRAVENOUS | Status: DC | PRN

## 2020-10-25 MED ORDER — SODIUM CHLORIDE 0.9 % IV SOLN: INTRAVENOUS | Status: DC | PRN

## 2020-10-25 MED ORDER — DIPHENHYDRAMINE HCL 50 MG/ML IJ SOLN: 50.0000 mg | Freq: Once | INTRAMUSCULAR | Status: DC | PRN

## 2020-10-25 NOTE — Progress Notes (Signed)
Patient reviewed Fact Sheet for Patients, Parents, and Caregivers for Emergency Use Authorization (EUA) of sotrovimab for the Treatment of Coronavirus. Patient also reviewed and is agreeable to the estimated cost of treatment. Patient is agreeable to proceed.   

## 2020-10-25 NOTE — Progress Notes (Signed)
Diagnosis: COVID-19  Physician: Dr. Patrick Wright  Procedure: Covid Infusion Clinic Med: Sotrovimab infusion - Provided patient with sotrovimab fact sheet for patients, parents, and caregivers prior to infusion.   Complications: No immediate complications noted  Discharge: Discharged home    

## 2020-10-25 NOTE — Discharge Instructions (Signed)

## 2020-11-18 DIAGNOSIS — R058 Other specified cough: Secondary | ICD-10-CM | POA: Diagnosis not present

## 2020-11-18 DIAGNOSIS — J309 Allergic rhinitis, unspecified: Secondary | ICD-10-CM | POA: Diagnosis not present

## 2020-11-18 DIAGNOSIS — J45909 Unspecified asthma, uncomplicated: Secondary | ICD-10-CM | POA: Diagnosis not present

## 2020-11-18 DIAGNOSIS — J209 Acute bronchitis, unspecified: Secondary | ICD-10-CM | POA: Diagnosis not present

## 2021-01-14 DIAGNOSIS — M545 Low back pain, unspecified: Secondary | ICD-10-CM | POA: Diagnosis not present

## 2021-01-14 DIAGNOSIS — D849 Immunodeficiency, unspecified: Secondary | ICD-10-CM | POA: Diagnosis not present

## 2021-01-14 DIAGNOSIS — J4541 Moderate persistent asthma with (acute) exacerbation: Secondary | ICD-10-CM | POA: Diagnosis not present

## 2021-01-14 DIAGNOSIS — J309 Allergic rhinitis, unspecified: Secondary | ICD-10-CM | POA: Diagnosis not present

## 2021-01-14 DIAGNOSIS — G7000 Myasthenia gravis without (acute) exacerbation: Secondary | ICD-10-CM | POA: Diagnosis not present

## 2021-01-14 DIAGNOSIS — R059 Cough, unspecified: Secondary | ICD-10-CM | POA: Diagnosis not present

## 2021-01-21 ENCOUNTER — Other Ambulatory Visit: Payer: Self-pay

## 2021-01-21 ENCOUNTER — Encounter: Payer: Self-pay | Admitting: Podiatry

## 2021-01-21 ENCOUNTER — Ambulatory Visit (INDEPENDENT_AMBULATORY_CARE_PROVIDER_SITE_OTHER): Payer: Medicare Other | Admitting: Podiatry

## 2021-01-21 DIAGNOSIS — M79674 Pain in right toe(s): Secondary | ICD-10-CM | POA: Diagnosis not present

## 2021-01-21 DIAGNOSIS — M79675 Pain in left toe(s): Secondary | ICD-10-CM | POA: Diagnosis not present

## 2021-01-21 DIAGNOSIS — B351 Tinea unguium: Secondary | ICD-10-CM

## 2021-01-24 DIAGNOSIS — M1711 Unilateral primary osteoarthritis, right knee: Secondary | ICD-10-CM | POA: Diagnosis not present

## 2021-01-24 DIAGNOSIS — M25561 Pain in right knee: Secondary | ICD-10-CM | POA: Diagnosis not present

## 2021-01-25 NOTE — Progress Notes (Signed)
Subjective: Sabrina [name] is a pleasant 80 y.o. female patient seen today painful thick toenails that are difficult to trim. Pain interferes with ambulation. Aggravating factors include wearing enclosed shoe gear. Pain is relieved with periodic professional debridement.  She voices no new pedal problems on today's visit.  PCP is Ginger Organ., MD. Last visit was: 03/18/2020.  Allergies  Allergen Reactions  . Azathioprine Anaphylaxis and Other (See Comments)    Kidneys shut down  . Other Anaphylaxis    Sodium pentathol Uncoded Allergy. Allergen: Sodium pentathol  . Aminoglycosides     Tobramycin, gentamycin, kanamycin, neomycin, streptomycin Can't take because of myasthenia gravis  . Avelox [Moxifloxacin Hcl In Nacl] Other (See Comments)    Can't take because of myasthenia gravis  . Beta Adrenergic Blockers     Proponolol, timolol maleate eyedrops Can't take because of myasthenia gravis  . Calcium Channel Blockers     Blood pressure medications Can't take because of myasthenia gravis  . Cephalosporins     Can't take because of myasthenia gravis  . Ciprofloxacin Other (See Comments)    Can't take because of myasthenia gravis Can't take because of myasthenia gravis  . Clindamycin/Lincomycin     May exascerbate myasthenia gravis  . Colistin     Can't take because of myasthenia gravis  . Fosamax [Alendronate Sodium]     LEG WEAKNESS  . Imuran [Azathioprine Sodium] Other (See Comments)    Kidneys shut down  . Iodinated Diagnostic Agents Other (See Comments)    Contraindicated to Myasthenia Gravis  . Iodine     Iodine contrast Can't take because of myasthenia gravis  . Macrolides And Ketolides     Erythromocin, azithromycin, telithromycin Can't take because of myasthenia gravis  . Magnesium-Containing Compounds     Including milk of magnesia, antacids containing magnesium hydroxide (maalox, mylanta) and epsom salts Can't take because of myasthenia gravis  .  Moxifloxacin Other (See Comments)    Can't take because of myasthenia gravis  . Norfloxacin Other (See Comments)    Can't take because of myasthenia gravis Can't take because of myasthenia gravis  . Ofloxacin Other (See Comments)    Can't take because of myasthenia gravis Can't take because of myasthenia gravis  . Pefloxacin Other (See Comments)    Can't take because of myasthenia gravis Can't take because of myasthenia gravis  . Penicillamine     do not use due to Myasthenia Gravis    . Prednisolone Nausea And Vomiting  . Prednisone Nausea And Vomiting  . Procainamide     Can't take because of myasthenia gravis  . Quinidine     Can't take because of myasthenia gravis  . Quinine Derivatives     Can't take because of myasthenia gravis  . Succinylcholine     Can't take because of myasthenia gravis No paralytics  . Tubocurarine     Can't take because of myasthenia gravis  . Vecuronium     Can't take because of myasthenia gravis No paralytics  . Vicodin [Hydrocodone-Acetaminophen] Other (See Comments)    Pt suffers from bad headaches    Objective: Physical Exam  General: Sabrina [name] is a pleasant 80 y.o. Caucasian female, in NAD. AAO x 3.   Vascular:  Capillary refill time to digits immediate b/l. Palpable pedal pulses b/l LE. Pedal hair absent. Lower extremity skin temperature gradient within normal limits. No pain with calf compression b/l. No edema noted b/l lower extremities.  Dermatological:  Pedal skin with normal  turgor, texture and tone bilaterally. No open wounds bilaterally. No interdigital macerations bilaterally. Toenails 1-5 b/l elongated, discolored, dystrophic, thickened, crumbly with subungual debris and tenderness to dorsal palpation.  Musculoskeletal:  Normal muscle strength 5/5 to all lower extremity muscle groups bilaterally. No pain crepitus or joint limitation noted with ROM b/l. No Curtner bony deformities bilaterally.  Neurological:  Protective  sensation intact 5/5 intact bilaterally with 10g monofilament b/l. Vibratory sensation intact b/l.  Assessment and Plan:  1. Pain due to onychomycosis of toenails of both feet     -Examined patient. -Patient to continue soft, supportive shoe gear daily. -Toenails 1-5 b/l were debrided in length and girth with sterile nail nippers and dremel without iatrogenic bleeding.  -Patient to report any pedal injuries to medical professional immediately. -Patient/POA to call should there be question/concern in the interim.  Return in about 3 months (around 04/23/2021).  Marzetta Board, DPM

## 2021-02-04 DIAGNOSIS — R0602 Shortness of breath: Secondary | ICD-10-CM | POA: Diagnosis not present

## 2021-02-04 DIAGNOSIS — R0902 Hypoxemia: Secondary | ICD-10-CM | POA: Diagnosis not present

## 2021-02-04 DIAGNOSIS — R112 Nausea with vomiting, unspecified: Secondary | ICD-10-CM | POA: Diagnosis not present

## 2021-02-25 DIAGNOSIS — M810 Age-related osteoporosis without current pathological fracture: Secondary | ICD-10-CM | POA: Diagnosis not present

## 2021-02-25 DIAGNOSIS — M859 Disorder of bone density and structure, unspecified: Secondary | ICD-10-CM | POA: Diagnosis not present

## 2021-02-25 DIAGNOSIS — E782 Mixed hyperlipidemia: Secondary | ICD-10-CM | POA: Diagnosis not present

## 2021-02-25 DIAGNOSIS — E039 Hypothyroidism, unspecified: Secondary | ICD-10-CM | POA: Diagnosis not present

## 2021-03-04 DIAGNOSIS — F325 Major depressive disorder, single episode, in full remission: Secondary | ICD-10-CM | POA: Diagnosis not present

## 2021-03-04 DIAGNOSIS — M810 Age-related osteoporosis without current pathological fracture: Secondary | ICD-10-CM | POA: Diagnosis not present

## 2021-03-04 DIAGNOSIS — G7000 Myasthenia gravis without (acute) exacerbation: Secondary | ICD-10-CM | POA: Diagnosis not present

## 2021-03-04 DIAGNOSIS — Z0000 Encounter for general adult medical examination without abnormal findings: Secondary | ICD-10-CM | POA: Diagnosis not present

## 2021-03-04 DIAGNOSIS — R7301 Impaired fasting glucose: Secondary | ICD-10-CM | POA: Diagnosis not present

## 2021-03-04 DIAGNOSIS — R82998 Other abnormal findings in urine: Secondary | ICD-10-CM | POA: Diagnosis not present

## 2021-03-04 DIAGNOSIS — M25561 Pain in right knee: Secondary | ICD-10-CM | POA: Diagnosis not present

## 2021-03-04 DIAGNOSIS — Z1331 Encounter for screening for depression: Secondary | ICD-10-CM | POA: Diagnosis not present

## 2021-03-04 DIAGNOSIS — E669 Obesity, unspecified: Secondary | ICD-10-CM | POA: Diagnosis not present

## 2021-03-04 DIAGNOSIS — E039 Hypothyroidism, unspecified: Secondary | ICD-10-CM | POA: Diagnosis not present

## 2021-03-04 DIAGNOSIS — D849 Immunodeficiency, unspecified: Secondary | ICD-10-CM | POA: Diagnosis not present

## 2021-03-04 DIAGNOSIS — E782 Mixed hyperlipidemia: Secondary | ICD-10-CM | POA: Diagnosis not present

## 2021-03-04 DIAGNOSIS — I10 Essential (primary) hypertension: Secondary | ICD-10-CM | POA: Diagnosis not present

## 2021-03-23 ENCOUNTER — Emergency Department (HOSPITAL_COMMUNITY): Payer: Medicare Other

## 2021-03-23 ENCOUNTER — Encounter (HOSPITAL_COMMUNITY): Payer: Self-pay | Admitting: Internal Medicine

## 2021-03-23 ENCOUNTER — Other Ambulatory Visit: Payer: Self-pay

## 2021-03-23 ENCOUNTER — Inpatient Hospital Stay (HOSPITAL_COMMUNITY)
Admission: EM | Admit: 2021-03-23 | Discharge: 2021-03-25 | DRG: 534 | Disposition: A | Discharge status: Skilled Nursing Facility | Payer: Medicare Other | Attending: Internal Medicine | Admitting: Internal Medicine

## 2021-03-23 DIAGNOSIS — Z91041 Radiographic dye allergy status: Secondary | ICD-10-CM | POA: Diagnosis not present

## 2021-03-23 DIAGNOSIS — W010XXA Fall on same level from slipping, tripping and stumbling without subsequent striking against object, initial encounter: Secondary | ICD-10-CM | POA: Diagnosis present

## 2021-03-23 DIAGNOSIS — S0081XA Abrasion of other part of head, initial encounter: Secondary | ICD-10-CM | POA: Diagnosis present

## 2021-03-23 DIAGNOSIS — Z9049 Acquired absence of other specified parts of digestive tract: Secondary | ICD-10-CM

## 2021-03-23 DIAGNOSIS — Z96612 Presence of left artificial shoulder joint: Secondary | ICD-10-CM | POA: Diagnosis present

## 2021-03-23 DIAGNOSIS — Z85828 Personal history of other malignant neoplasm of skin: Secondary | ICD-10-CM | POA: Diagnosis not present

## 2021-03-23 DIAGNOSIS — Z8249 Family history of ischemic heart disease and other diseases of the circulatory system: Secondary | ICD-10-CM

## 2021-03-23 DIAGNOSIS — Z888 Allergy status to other drugs, medicaments and biological substances status: Secondary | ICD-10-CM

## 2021-03-23 DIAGNOSIS — M5030 Other cervical disc degeneration, unspecified cervical region: Secondary | ICD-10-CM | POA: Diagnosis not present

## 2021-03-23 DIAGNOSIS — Z7951 Long term (current) use of inhaled steroids: Secondary | ICD-10-CM

## 2021-03-23 DIAGNOSIS — M1990 Unspecified osteoarthritis, unspecified site: Secondary | ICD-10-CM | POA: Diagnosis present

## 2021-03-23 DIAGNOSIS — I10 Essential (primary) hypertension: Secondary | ICD-10-CM | POA: Diagnosis present

## 2021-03-23 DIAGNOSIS — E739 Lactose intolerance, unspecified: Secondary | ICD-10-CM | POA: Diagnosis not present

## 2021-03-23 DIAGNOSIS — Z743 Need for continuous supervision: Secondary | ICD-10-CM | POA: Diagnosis not present

## 2021-03-23 DIAGNOSIS — Z823 Family history of stroke: Secondary | ICD-10-CM

## 2021-03-23 DIAGNOSIS — M169 Osteoarthritis of hip, unspecified: Secondary | ICD-10-CM | POA: Diagnosis not present

## 2021-03-23 DIAGNOSIS — S51812A Laceration without foreign body of left forearm, initial encounter: Secondary | ICD-10-CM | POA: Diagnosis present

## 2021-03-23 DIAGNOSIS — S728X1A Other fracture of right femur, initial encounter for closed fracture: Principal | ICD-10-CM | POA: Diagnosis present

## 2021-03-23 DIAGNOSIS — E039 Hypothyroidism, unspecified: Secondary | ICD-10-CM | POA: Diagnosis present

## 2021-03-23 DIAGNOSIS — Y92003 Bedroom of unspecified non-institutional (private) residence as the place of occurrence of the external cause: Secondary | ICD-10-CM

## 2021-03-23 DIAGNOSIS — J45909 Unspecified asthma, uncomplicated: Secondary | ICD-10-CM | POA: Diagnosis present

## 2021-03-23 DIAGNOSIS — W19XXXA Unspecified fall, initial encounter: Secondary | ICD-10-CM | POA: Diagnosis not present

## 2021-03-23 DIAGNOSIS — Z66 Do not resuscitate: Secondary | ICD-10-CM | POA: Diagnosis present

## 2021-03-23 DIAGNOSIS — E876 Hypokalemia: Secondary | ICD-10-CM | POA: Diagnosis present

## 2021-03-23 DIAGNOSIS — M9701XA Periprosthetic fracture around internal prosthetic right hip joint, initial encounter: Secondary | ICD-10-CM | POA: Diagnosis present

## 2021-03-23 DIAGNOSIS — Z881 Allergy status to other antibiotic agents status: Secondary | ICD-10-CM

## 2021-03-23 DIAGNOSIS — Z79890 Hormone replacement therapy (postmenopausal): Secondary | ICD-10-CM | POA: Diagnosis not present

## 2021-03-23 DIAGNOSIS — S0990XA Unspecified injury of head, initial encounter: Secondary | ICD-10-CM | POA: Diagnosis not present

## 2021-03-23 DIAGNOSIS — S72009A Fracture of unspecified part of neck of unspecified femur, initial encounter for closed fracture: Secondary | ICD-10-CM | POA: Diagnosis present

## 2021-03-23 DIAGNOSIS — Z20822 Contact with and (suspected) exposure to covid-19: Secondary | ICD-10-CM | POA: Diagnosis present

## 2021-03-23 DIAGNOSIS — J309 Allergic rhinitis, unspecified: Secondary | ICD-10-CM | POA: Diagnosis not present

## 2021-03-23 DIAGNOSIS — Y92009 Unspecified place in unspecified non-institutional (private) residence as the place of occurrence of the external cause: Secondary | ICD-10-CM

## 2021-03-23 DIAGNOSIS — I700 Atherosclerosis of aorta: Secondary | ICD-10-CM | POA: Diagnosis not present

## 2021-03-23 DIAGNOSIS — M2578 Osteophyte, vertebrae: Secondary | ICD-10-CM | POA: Diagnosis not present

## 2021-03-23 DIAGNOSIS — G7000 Myasthenia gravis without (acute) exacerbation: Secondary | ICD-10-CM | POA: Diagnosis present

## 2021-03-23 DIAGNOSIS — S199XXA Unspecified injury of neck, initial encounter: Secondary | ICD-10-CM | POA: Diagnosis not present

## 2021-03-23 DIAGNOSIS — E781 Pure hyperglyceridemia: Secondary | ICD-10-CM | POA: Diagnosis present

## 2021-03-23 DIAGNOSIS — M2550 Pain in unspecified joint: Secondary | ICD-10-CM | POA: Diagnosis not present

## 2021-03-23 DIAGNOSIS — S72001D Fracture of unspecified part of neck of right femur, subsequent encounter for closed fracture with routine healing: Secondary | ICD-10-CM | POA: Diagnosis not present

## 2021-03-23 DIAGNOSIS — R5381 Other malaise: Secondary | ICD-10-CM | POA: Diagnosis not present

## 2021-03-23 DIAGNOSIS — S72001A Fracture of unspecified part of neck of right femur, initial encounter for closed fracture: Secondary | ICD-10-CM | POA: Diagnosis not present

## 2021-03-23 DIAGNOSIS — E569 Vitamin deficiency, unspecified: Secondary | ICD-10-CM | POA: Diagnosis not present

## 2021-03-23 DIAGNOSIS — S80211A Abrasion, right knee, initial encounter: Secondary | ICD-10-CM | POA: Diagnosis present

## 2021-03-23 DIAGNOSIS — F419 Anxiety disorder, unspecified: Secondary | ICD-10-CM | POA: Diagnosis not present

## 2021-03-23 DIAGNOSIS — M852 Hyperostosis of skull: Secondary | ICD-10-CM | POA: Diagnosis not present

## 2021-03-23 DIAGNOSIS — Z79899 Other long term (current) drug therapy: Secondary | ICD-10-CM | POA: Diagnosis not present

## 2021-03-23 DIAGNOSIS — E785 Hyperlipidemia, unspecified: Secondary | ICD-10-CM | POA: Diagnosis present

## 2021-03-23 DIAGNOSIS — E871 Hypo-osmolality and hyponatremia: Secondary | ICD-10-CM | POA: Diagnosis present

## 2021-03-23 DIAGNOSIS — R531 Weakness: Secondary | ICD-10-CM | POA: Diagnosis not present

## 2021-03-23 DIAGNOSIS — M25551 Pain in right hip: Secondary | ICD-10-CM | POA: Diagnosis not present

## 2021-03-23 DIAGNOSIS — Z87892 Personal history of anaphylaxis: Secondary | ICD-10-CM

## 2021-03-23 LAB — BASIC METABOLIC PANEL
Anion gap: 12 (ref 5–15)
BUN: 7 mg/dL — ABNORMAL LOW (ref 8–23)
CO2: 27 mmol/L (ref 22–32)
Calcium: 9.9 mg/dL (ref 8.9–10.3)
Chloride: 95 mmol/L — ABNORMAL LOW (ref 98–111)
Creatinine, Ser: 0.54 mg/dL (ref 0.44–1.00)
GFR, Estimated: >60 mL/min (ref >60)
Glucose, Bld: 133 mg/dL — ABNORMAL HIGH (ref 70–99)
Potassium: 3.3 mmol/L — ABNORMAL LOW (ref 3.5–5.1)
Sodium: 134 mmol/L — ABNORMAL LOW (ref 135–145)

## 2021-03-23 LAB — CBC
HCT: 40 % (ref 36.0–46.0)
Hemoglobin: 13.2 g/dL (ref 12.0–15.0)
MCH: 28.3 pg (ref 26.0–34.0)
MCHC: 33 g/dL (ref 30.0–36.0)
MCV: 85.7 fL (ref 80.0–100.0)
Platelets: 326 10*3/uL (ref 150–400)
RBC: 4.67 MIL/uL (ref 3.87–5.11)
RDW: 13.1 % (ref 11.5–15.5)
WBC: 11.2 10*3/uL — ABNORMAL HIGH (ref 4.0–10.5)
nRBC: 0 % (ref 0.0–0.2)

## 2021-03-23 LAB — RESP PANEL BY RT-PCR (FLU A&B, COVID) ARPGX2
Influenza A by PCR: NEGATIVE
Influenza B by PCR: NEGATIVE
SARS Coronavirus 2 by RT PCR: NEGATIVE

## 2021-03-23 LAB — MAGNESIUM: Magnesium: 1.9 mg/dL (ref 1.7–2.4)

## 2021-03-23 MED ORDER — ALPRAZOLAM 0.5 MG PO TABS
0.5000 mg | ORAL_TABLET | Freq: Four times a day (QID) | ORAL | Status: DC
  Administered 2021-03-23 – 2021-03-25 (×7): 0.5 mg via ORAL
  Filled 2021-03-23 (×7): qty 1

## 2021-03-23 MED ORDER — POTASSIUM CHLORIDE CRYS ER 20 MEQ PO TBCR
40.0000 meq | EXTENDED_RELEASE_TABLET | Freq: Every day | ORAL | Status: AC
  Administered 2021-03-23 – 2021-03-24 (×2): 40 meq via ORAL
  Filled 2021-03-23 (×2): qty 2

## 2021-03-23 MED ORDER — OXYCODONE-ACETAMINOPHEN 5-325 MG PO TABS
1.0000 | ORAL_TABLET | Freq: Once | ORAL | Status: AC
  Administered 2021-03-23: 1 via ORAL
  Filled 2021-03-23: qty 1

## 2021-03-23 MED ORDER — FLUTICASONE-UMECLIDIN-VILANT 200-62.5-25 MCG/INH IN AEPB: 1.0000 | INHALATION_SPRAY | Freq: Every day | RESPIRATORY_TRACT | Status: DC

## 2021-03-23 MED ORDER — LACTASE 3000 UNITS PO TABS: 3000.0000 [IU] | ORAL_TABLET | ORAL | Status: DC | PRN

## 2021-03-23 MED ORDER — FLUOXETINE HCL 20 MG PO CAPS
20.0000 mg | ORAL_CAPSULE | Freq: Every morning | ORAL | Status: DC
  Administered 2021-03-24 – 2021-03-25 (×2): 20 mg via ORAL
  Filled 2021-03-23 (×2): qty 1

## 2021-03-23 MED ORDER — MONTELUKAST SODIUM 10 MG PO TABS
10.0000 mg | ORAL_TABLET | Freq: Every day | ORAL | Status: DC
  Administered 2021-03-23 – 2021-03-24 (×2): 10 mg via ORAL
  Filled 2021-03-23 (×2): qty 1

## 2021-03-23 MED ORDER — ENOXAPARIN SODIUM 40 MG/0.4ML IJ SOSY
40.0000 mg | PREFILLED_SYRINGE | INTRAMUSCULAR | Status: DC
  Administered 2021-03-23 – 2021-03-24 (×2): 40 mg via SUBCUTANEOUS
  Filled 2021-03-23 (×2): qty 0.4

## 2021-03-23 MED ORDER — VITAMIN D 25 MCG (1000 UNIT) PO TABS
1000.0000 [IU] | ORAL_TABLET | Freq: Every morning | ORAL | Status: DC
  Administered 2021-03-24 – 2021-03-25 (×2): 1000 [IU] via ORAL
  Filled 2021-03-23 (×3): qty 1

## 2021-03-23 MED ORDER — MYCOPHENOLATE MOFETIL 250 MG PO CAPS
1000.0000 mg | ORAL_CAPSULE | Freq: Two times a day (BID) | ORAL | Status: DC
  Filled 2021-03-23 (×2): qty 4

## 2021-03-23 MED ORDER — OMEGA-3-ACID ETHYL ESTERS 1 G PO CAPS
1000.0000 mg | ORAL_CAPSULE | Freq: Every day | ORAL | Status: DC
Start: 2021-03-23 — End: 2021-03-25
  Administered 2021-03-23 – 2021-03-25 (×3): 1000 mg via ORAL
  Filled 2021-03-23 (×3): qty 1

## 2021-03-23 MED ORDER — IRBESARTAN 150 MG PO TABS
150.0000 mg | ORAL_TABLET | Freq: Every day | ORAL | Status: DC
  Administered 2021-03-23 – 2021-03-25 (×3): 150 mg via ORAL
  Filled 2021-03-23 (×3): qty 1

## 2021-03-23 MED ORDER — MORPHINE SULFATE (PF) 2 MG/ML IV SOLN: 0.5000 mg | INTRAVENOUS | Status: DC | PRN

## 2021-03-23 MED ORDER — OXYCODONE-ACETAMINOPHEN 5-325 MG PO TABS
1.0000 | ORAL_TABLET | Freq: Four times a day (QID) | ORAL | Status: DC | PRN
Start: 2021-03-23 — End: 2021-03-25
  Administered 2021-03-23 – 2021-03-25 (×4): 1 via ORAL
  Filled 2021-03-23 (×4): qty 1

## 2021-03-23 MED ORDER — FLUTICASONE FUROATE-VILANTEROL 200-25 MCG/INH IN AEPB
1.0000 | INHALATION_SPRAY | Freq: Every day | RESPIRATORY_TRACT | Status: DC
  Filled 2021-03-23: qty 28

## 2021-03-23 MED ORDER — TRAZODONE HCL 50 MG PO TABS
75.0000 mg | ORAL_TABLET | Freq: Every day | ORAL | Status: DC
  Administered 2021-03-23 – 2021-03-24 (×2): 75 mg via ORAL
  Filled 2021-03-23 (×2): qty 2

## 2021-03-23 MED ORDER — LEVOTHYROXINE SODIUM 50 MCG PO TABS
75.0000 ug | ORAL_TABLET | Freq: Every day | ORAL | Status: DC
  Administered 2021-03-24 – 2021-03-25 (×2): 75 ug via ORAL
  Filled 2021-03-23 (×3): qty 1

## 2021-03-23 MED ORDER — UMECLIDINIUM BROMIDE 62.5 MCG/INH IN AEPB
1.0000 | INHALATION_SPRAY | Freq: Every day | RESPIRATORY_TRACT | Status: DC
  Filled 2021-03-23: qty 7

## 2021-03-23 MED ORDER — FLUTICASONE PROPIONATE 50 MCG/ACT NA SUSP
2.0000 | Freq: Every day | NASAL | Status: DC
  Administered 2021-03-24 – 2021-03-25 (×2): 2 via NASAL
  Filled 2021-03-23: qty 16

## 2021-03-23 NOTE — ED Provider Notes (Signed)
[facility] DEPT Provider Note   CSN: [id number] Arrival date & time: 03/23/21  3875     History Chief Complaint  Patient presents with   Sabrina Kuster LAMIS [name] is a 80 y.o. female.  80 year old female with extensive past medical history below including myasthenia gravis, hypertension, hyperlipidemia, right hip replacement who presents with fall and right hip pain.  Yesterday evening, patient tripped and fell while using her walker and landed on her right hip.  She was having right hip pain and required assistance getting up but initially thought that it was just bruised so she went to bed.  Around 2 AM she took Percocet for pain relief but it did not improve her symptoms.  This morning, she has had severe pain in her right buttock and hip with difficulty bearing any weight on her leg.  She tried to sit on her bed and missed the bed, falling and striking her right forehead.  She did not lose consciousness and denies any head or neck pain.  She sustained a skin tear to her left forearm but denies any pain in this area.  Pain is currently minimal to none while she is sitting still.  The history is provided by the patient.  Fall      Past Medical History:  Diagnosis Date   Anxiety    Arthritis    Asthma    Avascular necrosis (HCC)    Bradycardia    symptomatic   Cancer (HCC)    COUPLE OF SKIN CANCERS   Carpal tunnel syndrome    LEFT --NUMBNESS   Cataracts, bilateral    Cerebral aneurysm    Complication of anesthesia    BP FALLS/"ANAPHYLACTIC SHOCK"/ EXTREME TREMORS;  DID  FINE WITH SURGERIES APRIL AND OCT 2013 AT [facility] SURGERIES WERE SHORT PROCEDURES--PROBLEMS WITH ANESTHESIA SEEM TO OCCUR WITH THE LONGER PROCEDURES.THE TREMORS WERE AFTER HIP REPLACEMNT 22011-ARMS WERE FLAILING--PT STATES IT TOOK 4 DOSES OF DEMEROL BEFORE THE TREMORS STOPPED.   Depression    History of skin cancer    HSV (herpes simplex virus) infection    CHEST    Hyperlipidemia    Hypertension    PT HAS LOST WEIGHT--B/P NOW WITHIN NORMALS--IS ON B/P MEDICINE   Hypertriglyceridemia    Hypothyroidism    Myasthenia gravis    DOING WELL ON CELLCEPT-FOLLOWED BY DR. LISA HOBSON - [facility] NEUROLOGIST   OA (osteoarthritis) of hip    Osteopenia    Osteoporosis    Pain    LEFT SHOULDER PAIN-TORN ROTATOR CUFF-VERY LIMITED ROM   Pain    TOP OF RIGHT SHOULDER--ROM OK AT PRESENT   Pneumonia    Rash, skin    LOWER ABDOMEN-FUNGAL INFECTION-PT HAS TOPICAL OINTMENT TO USE AS NEEDED.   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[place]) 12/03/2004    Past Surgical History:  Procedure Laterality Date   BELPHAROPTOSIS REPAIR  01/2006   PTOSIS REPAIR BILATERAL   CARPAL TUNNEL RELEASE  05/31/2012   Procedure:  CARPAL TUNNEL RELEASE;  Surgeon: Mcarthur Rossetti, MD;  Location: [facility];  Service: Orthopedics;  Laterality: Left;  Left Open Carpal Tunnel Release   CARPAL TUNNEL RELEASE  10/10/2012   Procedure: CARPAL TUNNEL RELEASE;  Surgeon: Nita Sells, MD;  Location: [facility];  Service: Orthopedics;  Laterality: Left;   CEREBRAL ANEURYSM REPAIR  10/2005   GUGLIEMI COILS TO REPAIR ANEURYSM; NO RESIDUAL PROBLEMS--HAS FOLLOW UP MRI EVERY 1 OR 2 YRS   CHOLECYSTECTOMY     EXTREME DROP IN BLOOD PRESSURE WITH THIS SURGERY- YRS AGO- PT WAS 80 YRS OLD   JOINT REPLACEMENT  10/19/09   RT HIP--SEVERE TREMORS, FLAILING OF ARMS AFTER THIS SURGERY.   left shoulder  arthroscopy      REVERSE SHOULDER ARTHROPLASTY  10/10/2012   Procedure: REVERSE SHOULDER ARTHROPLASTY;  Surgeon: Nita Sells, MD;  Location: [facility];  Service: Orthopedics;  Laterality: Left;   TONSILLECTOMY AND ADENOIDECTOMY     TOTAL HIP ARTHROPLASTY     right   TUBAL LIGATION     ANAPHYLACTIC SHOCK AFTER THIS SURGERY-CAUSE THOUGHT TO  BE RELATED TO SODIUM PENTOTHAL     OB History   No obstetric history on file.     Family History  Problem Relation Age of Onset   Hypertension Mother    Heart disease Mother        heart attack   Stroke Father    Heart disease Father        heart attack   Breast cancer Neg Hx     Social History   Tobacco Use   Smoking status: Never   Smokeless tobacco: Never  Substance Use Topics   Alcohol use: Yes    Comment: OCCASIONAL   Drug use: No    Home Medications Prior to Admission medications   Medication Sig Start Date End Date Taking? Authorizing Provider  albuterol (PROVENTIL HFA;VENTOLIN HFA) 108 (90 BASE) MCG/ACT inhaler Inhale 2 puffs into the lungs every 6 (six) hours as needed for wheezing.     [provider]  ALPRAZolam Duanne Moron) 0.5 MG tablet Take 0.5 mg by mouth every 6 (six) hours.     [provider]  amoxicillin (AMOXIL) 500 MG capsule TK 4 CS PO 1 HOUR BEFORE SURGERY 02/24/19   [provider]  Ascorbic Acid (VITAMIN C) 1000 MG tablet Take 1,000 mg by mouth every morning.     [provider]  Cholecalciferol (VITAMIN D-3 PO) Take 1,000 mg by mouth every morning.     [provider]  DULoxetine (CYMBALTA) 60 MG capsule Take 60 mg by mouth daily. 03/04/21   [provider]  FLUoxetine (PROZAC) 20 MG capsule daily.  03/17/19   [provider]  FLUoxetine (PROZAC) 20 MG tablet Take 20 mg by mouth every morning.     [provider]  fluticasone (FLONASE) 50 MCG/ACT nasal spray Place 2 sprays into both nostrils daily. 07/27/17   [provider]   Fluticasone-Salmeterol (ADVAIR DISKUS) 250-50 MCG/DOSE AEPB Inhale 1 puff into the lungs every 12 (twelve) hours.     [provider]  Ibuprofen-Diphenhydramine Cit 200-38 MG TABS Take 1 tablet by mouth at bedtime.    [provider]  levothyroxine (SYNTHROID, LEVOTHROID) 75 MCG tablet Take 75 mcg by mouth every morning. MUST TAKE BRAND NAME SYNTHROID    [provider]  methocarbamol (ROBAXIN) 500 MG tablet Take 500 mg by mouth as needed. 05/12/20   [provider]  Misc Natural Products (  T-RELIEF CBD+13) SUBL Place under the tongue as needed (pain).    [provider]  montelukast (SINGULAIR) 10 MG tablet Take 10 mg by mouth at bedtime. MUST BE BRAND NAME ONLY-SINGULAIR    [provider]  Multiple Vitamins-Minerals (CENTRUM SILVER) tablet Take by mouth. 02/16/11   [provider]  mycophenolate (CELLCEPT) 500 MG tablet Take 1,000 mg by mouth 2 (two) times daily.     [provider]  naproxen (NAPROSYN) 500 MG tablet Take 500 mg by mouth 2 (two) times daily. 03/04/21   [provider]  traZODone (DESYREL) 150 MG tablet Take 75 mg by mouth at bedtime.     [provider]  [name] 200-62.5-25 MCG/INH AEPB Inhale 1 puff into the lungs daily. 02/28/21   [provider]  valsartan-hydrochlorothiazide (DIOVAN-HCT) 160-12.5 MG tablet Take 1 tablet by mouth daily. 09/28/17   [provider]    Allergies    Azathioprine, Other, Aminoglycosides, Avelox [moxifloxacin hcl in nacl], Beta adrenergic blockers, Calcium channel blockers, Cephalosporins, Ciprofloxacin, Clindamycin/lincomycin, Colistin, Fosamax [alendronate sodium], Imuran [azathioprine sodium], Iodinated diagnostic agents, Iodine, Macrolides and ketolides, Magnesium-containing compounds, Moxifloxacin, Norfloxacin, Ofloxacin, Pefloxacin, Penicillamine, Prednisolone, Prednisone, Procainamide, Quinidine, Quinine derivatives, Succinylcholine,  Tubocurarine, Vecuronium, and Vicodin [hydrocodone-acetaminophen]  Review of Systems   Review of Systems All other systems reviewed and are negative except that which was mentioned in HPI  Physical Exam Updated Vital Signs BP (!) 146/71   Pulse 70   Temp 98 F (36.7 C)   Resp 16   Ht 5\' 5"  (1.651 m)   Wt 81.6 kg   SpO2 95%   BMI 29.95 kg/m   Physical Exam Constitutional:      General: She is not in acute distress.    Appearance: Normal appearance.  HENT:     Head:     Comments: Hematoma w/ abrasion R forehead, abrasion R cheek below eye; no facial tenderness    Nose: Nose normal.     Mouth/Throat:     Mouth: Mucous membranes are moist.     Pharynx: Oropharynx is clear.  Eyes:     Extraocular Movements: Extraocular movements intact.     Conjunctiva/sclera: Conjunctivae normal.     Pupils: Pupils are equal, round, and reactive to light.  Cardiovascular:     Rate and Rhythm: Normal rate and regular rhythm.     Pulses: Normal pulses.     Heart sounds: Normal heart sounds. No murmur heard. Pulmonary:     Effort: Pulmonary effort is normal.     Breath sounds: Normal breath sounds.  Abdominal:     General: Abdomen is flat. Bowel sounds are normal. There is no distension.     Palpations: Abdomen is soft.     Tenderness: There is no abdominal tenderness.  Musculoskeletal:     Right lower leg: No edema.     Left lower leg: No edema.     Comments: R leg lengthened and slightly externally rotated compared to L; abrasion R knee without joint effusion or focal tenderness; normal ROM BUE  Skin:    General: Skin is warm and dry.     Comments: Skin tear L dorsal forearm  Neurological:     Mental Status: She is alert and oriented to person, place, and time.     Comments: fluent  Psychiatric:        Mood and Affect: Mood normal.        Behavior: Behavior normal.    ED Results / Procedures /  Treatments   Labs (all labs ordered are listed, but only abnormal results are  displayed) Labs Reviewed  BASIC METABOLIC PANEL - Abnormal; Notable for the following components:      Result Value   Sodium 134 (*)    Potassium 3.3 (*)    Chloride 95 (*)    Glucose, Bld 133 (*)    BUN 7 (*)    All other components within normal limits  CBC - Abnormal; Notable for the following components:   WBC 11.2 (*)    All other components within normal limits  RESP PANEL BY RT-PCR (FLU A&B, COVID) [id number]    EKG None  Radiology CT Head Wo Contrast  Result Date: 03/23/2021 CLINICAL DATA:  Poly trauma.  Fall. EXAM: CT HEAD WITHOUT CONTRAST CT CERVICAL SPINE WITHOUT CONTRAST TECHNIQUE: Multidetector CT imaging of the head and cervical spine was performed following the standard protocol without intravenous contrast. Multiplanar CT image reconstructions of the cervical spine were also generated. COMPARISON:  CT of the neck from 08/11/2010. FINDINGS: CT HEAD FINDINGS Brain: No evidence of acute large vascular territory infarction, hemorrhage, hydrocephalus, extra-axial collection or mass lesion/mass effect. Mild patchy white matter hypoattenuation, likely related to mild chronic microvascular ischemic disease. 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 Surrounding streak artifact limits evaluation. Intracranial atherosclerosis. Skull: No acute fracture.  Hyperostosis frontalis. Sinuses/Orbits: Partially imaged left maxillary sinus opacification with air-fluid level. Other: No mastoid effusions. CT CERVICAL SPINE FINDINGS Alignment: Mild anterolisthesis of T1 on T2 and T2 on T3, likely degenerative given similar findings on prior CT neck from 2011. Otherwise, no substantial sagittal subluxation. There is reversal of the normal cervical lordosis. Levocurvature of the lower cervical spine, centered at the cervicothoracic junction. Skull base and vertebrae: No evidence of acute fracture. Vertebral body heights are maintained.  Diffuse osteopenia. Soft tissues and spinal canal: No prevertebral fluid or swelling. No visible canal hematoma. Disc levels: Severe multilevel degenerative disease in the lower cervical spine with severe disc height loss, endplate sclerosis and posterior disc osteophyte complexes. Bulky multilevel left greater than right facet arthropathy with likely at least moderate foraminal stenosis bilaterally at C6-C7 and on the right at C7-T1. Upper chest: Visualized lung apices are clear. Other: Calcific atherosclerosis of the aorta. IMPRESSION: CT head: 1. No evidence of acute intracranial abnormality. 2. 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 3. Left maxillary sinus opacification with air-fluid level, partially imaged. CT cervical spine: 1. No evidence of acute fracture or traumatic malalignment. 2. 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 3. Bulky multilevel left greater than right facet and uncovertebral hypertrophy with likely at least moderate foraminal stenosis bilaterally at C6-C7 and on the right at C7-T1. An MRI could further characterize the canal and foramina if clinically indicated. Electronically Signed   By: Margaretha Sheffield MD   On: 03/23/2021 11:54   CT Cervical Spine Wo Contrast  Result Date: 03/23/2021 CLINICAL DATA:  Poly trauma.  Fall. EXAM: CT HEAD WITHOUT CONTRAST CT CERVICAL SPINE WITHOUT CONTRAST TECHNIQUE: Multidetector CT imaging of the head and cervical spine was performed following the standard protocol without intravenous contrast. Multiplanar CT image reconstructions of the cervical spine were also generated. COMPARISON:  CT of the neck from 08/11/2010. FINDINGS: CT HEAD FINDINGS Brain: No evidence of acute large vascular territory infarction, hemorrhage, hydrocephalus, extra-axial collection or mass lesion/mass effect. Mild patchy white matter  hypoattenuation, likely related to mild  chronic microvascular ischemic disease. 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 Surrounding streak artifact limits evaluation. Intracranial atherosclerosis. Skull: No acute fracture.  Hyperostosis frontalis. Sinuses/Orbits: Partially imaged left maxillary sinus opacification with air-fluid level. Other: No mastoid effusions. CT CERVICAL SPINE FINDINGS Alignment: Mild anterolisthesis of T1 on T2 and T2 on T3, likely degenerative given similar findings on prior CT neck from 2011. Otherwise, no substantial sagittal subluxation. There is reversal of the normal cervical lordosis. Levocurvature of the lower cervical spine, centered at the cervicothoracic junction. Skull base and vertebrae: No evidence of acute fracture. Vertebral body heights are maintained. Diffuse osteopenia. Soft tissues and spinal canal: No prevertebral fluid or swelling. No visible canal hematoma. Disc levels: Severe multilevel degenerative disease in the lower cervical spine with severe disc height loss, endplate sclerosis and posterior disc osteophyte complexes. Bulky multilevel left greater than right facet arthropathy with likely at least moderate foraminal stenosis bilaterally at C6-C7 and on the right at C7-T1. Upper chest: Visualized lung apices are clear. Other: Calcific atherosclerosis of the aorta. IMPRESSION: CT head: 1. No evidence of acute intracranial abnormality. 2. 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 3. Left maxillary sinus opacification with air-fluid level, partially imaged. CT cervical spine: 1. No evidence of acute fracture or traumatic malalignment. 2. 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 3. Bulky multilevel left greater than right facet and uncovertebral hypertrophy with likely at least moderate foraminal stenosis bilaterally at C6-C7 and on the  right at C7-T1. An MRI could further characterize the canal and foramina if clinically indicated. Electronically Signed   By: Margaretha Sheffield MD   On: 03/23/2021 11:54   DG Hip Unilat W or Wo Pelvis 2-3 Views Right  Result Date: 03/23/2021 CLINICAL DATA:  Right hip pain after fall. EXAM: DG HIP (WITH OR WITHOUT PELVIS) 2-3V RIGHT COMPARISON:  None. FINDINGS: Status post right total hip arthroplasty. The acetabular and femoral components are well situated. There is noted cortical irregularity involving the subtrochanteric region concerning for minimally displaced periprosthetic fracture. IMPRESSION: Status post right total hip arthroplasty. Probable minimally displaced periprosthetic fracture is seen involving the proximal right femur. Electronically Signed   By: Marijo Conception M.D.   On: 03/23/2021 11:56    Procedures Procedures   Medications Ordered in ED Medications  oxyCODONE-acetaminophen (PERCOCET/ROXICET) 5-325 MG per tablet 1 tablet (1 tablet Oral Given 03/23/21 1129)    ED Course  I have reviewed the triage vital signs and the nursing notes.  Pertinent labs & imaging results that were available during my care of the patient were reviewed by me and considered in my medical decision making (see chart for details).    MDM Rules/Calculators/A&P                           Neurologically intact and neurovascularly intact distally.  Concern for right hip injury based on exam.  Obtain CT of head and cervical spine as well as hip and chest x-rays.  Screening lab work unremarkable.  CT of head, cervical spine, and chest x-ray reassuring against acute injury.  Hip films show likely minimally displaced periprosthetic fracture of proximal femur.  Discussed with patient's orthopedic surgeon, Dr. Ninfa Linden, who recommended medicine admission and states that patient will likely be treated nonoperatively.  Discussed admission with [facility] hospitalist,  Dr. Marylyn Ishihara.  I appreciate his assistance with the  patient's care. Final Clinical Impression(s) / ED Diagnoses Final diagnoses:  Fracture, proximal femur, right, closed, initial encounter [facility])    Rx / DC Orders ED Discharge Orders     None        Adell Koval, Wenda Overland, MD 03/23/21 1423

## 2021-03-23 NOTE — Progress Notes (Signed)
Patient ID: Sabrina [name], female   DOB: [birth year], 80 y.o.   MRN: [id number]  I came by the bedside to see Ms. Killilea.  She is very well-known to me.  She actually had the first anterior hip replacement performed in [facility] in February 2011.  This was of her right hip.  She does ambulate with a rolling walker.  He is 80 years old.  She had two mechanical falls yesterday and after the second fall, had difficulty weightbearing on her right hip.  She is brought to the [facility] emergency room today and found to have a right hip periprosthetic fracture.  I reviewed the x-rays.  There is a fracture line in the trochanteric area but the hip replacement components are intact and have not subsided at all.  This is a more stable fracture pattern.  On exam her leg lengths are equal.  Her right hip is well located but does exhibit pain on manipulating the right hip.  She would prefer nonoperative treatment and I do feel that this is reasonable for her right hip based on what I am seeing clinically and from a radiographic standpoint.  However, she will need to be nonweightbearing on her right hip for likely 4 to 6 weeks to allow for adequate healing.  Thus far, surgery is not indicated based on the stability of the hip implant itself.  This could certainly change but I am not expecting that to be the case.  I will follow her closely while she is here.  She will need PT and OT with nonweightbearing on her right lower extremity and to help with ADLs.  She will likely need short-term skilled nursing placement.  I talked to her about her hip in detail.  She does not have significant discomfort with nonweightbearing and with sitting up.

## 2021-03-23 NOTE — Progress Notes (Signed)
Patient ID: Sabrina [name], female   DOB: 03/09/1941, 80 y.o.   MRN: [id number] I did review the patient's plain films.  She is well-known to me.  It looks like she has a nondisplaced periprosthetic fracture around her femoral component of her right hip.  The component appears well-seated.  She can eat from my standpoint.  This will likely be treated nonoperative with nonweightbearing on that right hip for a minimum of 4 to 6 weeks.  I will see her while she is in the hospital.

## 2021-03-23 NOTE — ED Notes (Signed)
Pt in scans. °

## 2021-03-23 NOTE — Progress Notes (Signed)
Spoke with patient's daughter Ms. [name] in regards to home medication she was told to bring in (Cellcept and synthroid). Ms. Vickii Chafe stated her mom can only have the brand name of synthroid and is very sensitive to medication. She also told this RN, that she gave her mom (the pt) her nighttime dose of Cellcept and a dose of synthroid tonight while she was in the room visiting. This RN was not present in the room at the time. Educated pt and daughter about home medications and not self administering home medications during hospital stay to ensure the safety of the pt. Medications that were not present during shift change are now present in the room. Will call pharmacy and send meds down.

## 2021-03-23 NOTE — ED Triage Notes (Signed)
Ems brings pt in from home for a fall. Ems states pt fell while using her walker yesterday and complains on right hip pain. Ems also reports pt fell this morning. States she slipped off the bed on to the floor.

## 2021-03-23 NOTE — ED Notes (Addendum)
Dressing applied to left forearm. Pt given [place] sandwich, cheese stick, and drink.

## 2021-03-23 NOTE — H&P (Signed)
History and Physical    Sabrina [name] HER:[id number] DOB: August 13, 1941 DOA: 03/23/2021  PCP: Ginger Organ., MD  Patient coming from: Home  Chief Complaint: fall  HPI: Sabrina [name] is a 80 y.o. female with medical history significant of arthritis, anxiety, hypothyroidism. Presenting with right hip pain after fall. She reports that she was trying to move a tray table yesterday afternoon. While using her walker, she got tripped up and fell onto her right hip. She had a sharp pain and was unable to get up on her own. The neighbor came by and help her up. She felt as if she were fine enough to stay home. She decided to go rest and her daughter brought her a bedside commode. During the night she was trying to get to the [facility] when she slipped and landed on her right hip again. This time she hit her right head, but did not suffer LOC. She also scrapped her arm. She decided to come to the ED this morning when her hip was still in pain. She denies any other aggravating or alleviating factors.   ED Course: XR showed right hip fx. Ortho was consulted. [facility] was called for admission.   Review of Systems:  Denies CP, dyspnea, palpitations, ab pain, N/V/d, fever, LOC. Review of systems is otherwise negative for all not mentioned in HPI.   PMHx Past Medical History:  Diagnosis Date   Anxiety    Arthritis    Asthma    Avascular necrosis (HCC)    Bradycardia    symptomatic   Cancer (HCC)    COUPLE OF SKIN CANCERS   Carpal tunnel syndrome    LEFT --NUMBNESS   Cataracts, bilateral    Cerebral aneurysm    Complication of anesthesia    BP FALLS/"ANAPHYLACTIC SHOCK"/ EXTREME TREMORS;  DID  FINE WITH SURGERIES APRIL AND OCT 2013 AT [facility] SURGERIES WERE SHORT PROCEDURES--PROBLEMS WITH ANESTHESIA SEEM TO OCCUR WITH THE LONGER PROCEDURES.THE TREMORS WERE AFTER HIP REPLACEMNT 22011-ARMS WERE FLAILING--PT STATES IT TOOK 4 DOSES OF DEMEROL BEFORE THE TREMORS STOPPED.   Depression    History of skin cancer     HSV (herpes simplex virus) infection    CHEST   Hyperlipidemia    Hypertension    PT HAS LOST WEIGHT--B/P NOW WITHIN NORMALS--IS ON B/P MEDICINE   Hypertriglyceridemia    Hypothyroidism    Myasthenia gravis    DOING WELL ON CELLCEPT-FOLLOWED BY DR. LISA HOBSON - [facility]   OA (osteoarthritis) of hip    Osteopenia    Osteoporosis    Pain    LEFT SHOULDER PAIN-TORN ROTATOR CUFF-VERY LIMITED ROM   Pain    TOP OF RIGHT SHOULDER--ROM OK AT PRESENT   Pneumonia    Rash, skin    LOWER ABDOMEN-FUNGAL INFECTION-PT HAS TOPICAL OINTMENT TO USE AS NEEDED.   Seasonal allergies    Thyroid disease     PSHx Past Surgical History:  Procedure Laterality Date   BELPHAROPTOSIS REPAIR  01/2006   PTOSIS REPAIR BILATERAL   CARPAL TUNNEL RELEASE  05/31/2012   Procedure: CARPAL TUNNEL RELEASE;  Surgeon: Mcarthur Rossetti, MD;  Location: [facility];  Service: Orthopedics;  Laterality: Left;  Left Open Carpal Tunnel Release   CARPAL TUNNEL RELEASE  10/10/2012   Procedure: CARPAL TUNNEL RELEASE;  Surgeon: Nita Sells, MD;  Location: [facility];  Service: Orthopedics;  Laterality: Left;   CEREBRAL ANEURYSM REPAIR  10/2005   GUGLIEMI COILS TO REPAIR ANEURYSM; NO RESIDUAL PROBLEMS--HAS  FOLLOW UP MRI EVERY 1 OR 2 YRS   CHOLECYSTECTOMY     EXTREME DROP IN BLOOD PRESSURE WITH THIS SURGERY- YRS AGO- PT WAS 80 YRS OLD   JOINT REPLACEMENT  10/19/09   RT HIP--SEVERE TREMORS, FLAILING OF ARMS AFTER THIS SURGERY.   left shoulder arthroscopy      REVERSE SHOULDER ARTHROPLASTY  10/10/2012   Procedure: REVERSE SHOULDER ARTHROPLASTY;  Surgeon: Nita Sells, MD;  Location: [facility];  Service: Orthopedics;  Laterality: Left;   TONSILLECTOMY AND ADENOIDECTOMY     TOTAL HIP ARTHROPLASTY     right   TUBAL LIGATION     ANAPHYLACTIC SHOCK AFTER THIS SURGERY-CAUSE THOUGHT TO  BE RELATED TO SODIUM PENTOTHAL    SocHx  reports that she has never smoked. She has never used smokeless tobacco. She reports  current alcohol use. She reports that she does not use drugs.  Allergies  Allergen Reactions   Azathioprine Anaphylaxis and Other (See Comments)    Kidneys shut down   Other Anaphylaxis    Sodium pentathol Uncoded Allergy. Allergen: Sodium pentathol   Aminoglycosides     Tobramycin, gentamycin, kanamycin, neomycin, streptomycin Can't take because of myasthenia gravis   Avelox [Moxifloxacin Hcl In Nacl] Other (See Comments)    Can't take because of myasthenia gravis   Beta Adrenergic Blockers     Proponolol, timolol maleate eyedrops Can't take because of myasthenia gravis   Calcium Channel Blockers     Blood pressure medications Can't take because of myasthenia gravis   Cephalosporins     Can't take because of myasthenia gravis   Ciprofloxacin Other (See Comments)    Can't take because of myasthenia gravis Can't take because of myasthenia gravis   Clindamycin/Lincomycin     May exascerbate myasthenia gravis   Colistin     Can't take because of myasthenia gravis   Fosamax [Alendronate Sodium]     LEG WEAKNESS   Imuran [Azathioprine Sodium] Other (See Comments)    Kidneys shut down   Iodinated Diagnostic Agents Other (See Comments)    Contraindicated to Myasthenia Gravis   Iodine     Iodine contrast Can't take because of myasthenia gravis   Macrolides And Ketolides     Erythromocin, azithromycin, telithromycin Can't take because of myasthenia gravis   Magnesium-Containing Compounds     Including milk of magnesia, antacids containing magnesium hydroxide (maalox, mylanta) and epsom salts Can't take because of myasthenia gravis   Moxifloxacin Other (See Comments)    Can't take because of myasthenia gravis   Norfloxacin Other (See Comments)    Can't take because of myasthenia gravis Can't take because of myasthenia gravis   Ofloxacin Other (See Comments)    Can't take because of myasthenia gravis Can't take because of myasthenia gravis   Pefloxacin Other (See Comments)     Can't take because of myasthenia gravis Can't take because of myasthenia gravis   Penicillamine     do not use due to Myasthenia Gravis     Prednisolone Nausea And Vomiting   Prednisone Nausea And Vomiting   Procainamide     Can't take because of myasthenia gravis   Quinidine     Can't take because of myasthenia gravis   Quinine Derivatives     Can't take because of myasthenia gravis   Succinylcholine     Can't take because of myasthenia gravis No paralytics   Tubocurarine     Can't take because of myasthenia gravis   Vecuronium  Can't take because of myasthenia gravis No paralytics   Vicodin [Hydrocodone-Acetaminophen] Other (See Comments)    Pt suffers from bad headaches    FamHx Family History  Problem Relation Age of Onset   Hypertension Mother    Heart disease Mother        heart attack   Stroke Father    Heart disease Father        heart attack   Breast cancer Neg Hx     Prior to Admission medications   Medication Sig Start Date End Date Taking? Authorizing Provider  ALPRAZolam Duanne Moron) 0.5 MG tablet Take 0.5 mg by mouth every 6 (six) hours.    Yes [provider]  amoxicillin (AMOXIL) 500 MG capsule Take 2,000 mg by mouth daily as needed (before dental procedures). 02/24/19  Yes [provider]  Ascorbic Acid (VITAMIN C) 1000 MG tablet Take 1,000 mg by mouth every morning.    Yes [provider]  Cholecalciferol (VITAMIN D-3 PO) Take 1,000 mg by mouth every morning.    Yes [provider]  diclofenac Sodium (VOLTAREN) 1 % GEL Apply 2 g topically 4 (four) times daily as needed (pain).   Yes [provider]  FLUoxetine (PROZAC) 20 MG tablet Take 20 mg by mouth every morning.    Yes [provider]  fluticasone (FLONASE) 50 MCG/ACT nasal spray Place 2 sprays into both nostrils daily. 07/27/17  Yes [provider]  Ibuprofen-Diphenhydramine Cit 200-38 MG TABS Take 1 tablet by mouth at bedtime.   Yes  [provider]  Lactase (LACTAID PO) Take 1 tablet by mouth as needed (before dairy).   Yes [provider]  levothyroxine (SYNTHROID, LEVOTHROID) 75 MCG tablet Take 75 mcg by mouth every morning. MUST TAKE BRAND NAME SYNTHROID   Yes [provider]  montelukast (SINGULAIR) 10 MG tablet Take 10 mg by mouth at bedtime. MUST BE BRAND NAME ONLY-SINGULAIR   Yes [provider]  Multiple Vitamins-Minerals (CENTRUM SILVER) tablet Take by mouth. 02/16/11  Yes [provider]  mycophenolate (CELLCEPT) 500 MG tablet Take 1,000 mg by mouth 2 (two) times daily.    Yes [provider]  Omega 3 1200 MG CAPS Take 1,200 mg by mouth daily.   Yes [provider]  oxyCODONE-acetaminophen (PERCOCET/ROXICET) 5-325 MG tablet Take 1 tablet by mouth 4 (four) times daily as needed for severe pain.   Yes [provider]  traZODone (DESYREL) 150 MG tablet Take 75 mg by mouth at bedtime.    Yes [provider]  TRELEGY ELLIPTA 200-62.5-25 MCG/INH AEPB Inhale 1 puff into the lungs daily. 02/28/21  Yes [provider]  valsartan-hydrochlorothiazide (DIOVAN-HCT) 160-12.5 MG tablet Take 1 tablet by mouth daily. 09/28/17  Yes [provider]    Physical Exam: Vitals:   03/23/21 0957 03/23/21 1030 03/23/21 1245 03/23/21 1402  BP: (!) 176/70 (!) 147/65 (!) 146/58 (!) 146/71  Pulse:  67 65 70  Resp:  16 18 16   Temp: 98 F (36.7 C)     SpO2:  95% 97% 95%  Weight:      Height:        General: 80 y.o. female resting in bed in NAD Eyes: PERRL, normal sclera ENMT: Nares patent w/o discharge, orophaynx clear, dentition normal, ears w/o discharge/lesions/ulcers; abrasion/bruising around right eye Neck: Supple, trachea midline Cardiovascular: RRR, +S1, S2, no m/g/r, equal pulses throughout Respiratory: CTABL, no w/r/r, normal WOB GI: BS+, NDNT, no masses noted, no organomegaly noted MSK:  No e/c/c; right hip limited ROM d/t  pain Neuro: A&O x 3, no focal deficits Psyc: Appropriate interaction and affect, calm/cooperative  Labs on Admission: I have personally reviewed following labs and imaging studies  CBC: Recent Labs  Lab 03/23/21 1046  WBC 11.2*  HGB 13.2  HCT 40.0  MCV 85.7  PLT 951   Basic Metabolic Panel: Recent Labs  Lab 03/23/21 1046  NA 134*  K 3.3*  CL 95*  CO2 27  GLUCOSE 133*  BUN 7*  CREATININE 0.54  CALCIUM 9.9   GFR: Estimated Creatinine Clearance: 59.1 mL/min (by C-G formula based on SCr of 0.54 mg/dL). Liver Function Tests: No results for input(s): AST, ALT, ALKPHOS, BILITOT, PROT, ALBUMIN in the last 168 hours. No results for input(s): LIPASE, AMYLASE in the last 168 hours. No results for input(s): AMMONIA in the last 168 hours. Coagulation Profile: No results for input(s): INR, PROTIME in the last 168 hours. Cardiac Enzymes: No results for input(s): CKTOTAL, CKMB, CKMBINDEX, TROPONINI in the last 168 hours. BNP (last 3 results) No results for input(s): PROBNP in the last 8760 hours. HbA1C: No results for input(s): HGBA1C in the last 72 hours. CBG: No results for input(s): GLUCAP in the last 168 hours. Lipid Profile: No results for input(s): CHOL, HDL, LDLCALC, TRIG, CHOLHDL, LDLDIRECT in the last 72 hours. Thyroid Function Tests: No results for input(s): TSH, T4TOTAL, FREET4, T3FREE, THYROIDAB in the last 72 hours. Anemia Panel: No results for input(s): VITAMINB12, FOLATE, FERRITIN, TIBC, IRON, RETICCTPCT in the last 72 hours. Urine analysis:    Component Value Date/Time   COLORURINE YELLOW 10/04/2012 [place] 10/04/2012 1445   LABSPEC 1.009 10/04/2012 1445   PHURINE 6.0 10/04/2012 1445   GLUCOSEU NEGATIVE 10/04/2012 1445   HGBUR NEGATIVE 10/04/2012 1445   BILIRUBINUR NEGATIVE 10/04/2012 1445   KETONESUR NEGATIVE 10/04/2012 1445   PROTEINUR NEGATIVE 10/04/2012 1445   UROBILINOGEN 0.2 10/04/2012 1445   NITRITE NEGATIVE 10/04/2012 1445    LEUKOCYTESUR TRACE (A) 10/04/2012 1445    Radiological Exams on Admission: CT Head Wo Contrast  Result Date: 03/23/2021 CLINICAL DATA:  Poly trauma.  Fall. EXAM: CT HEAD WITHOUT CONTRAST CT CERVICAL SPINE WITHOUT CONTRAST TECHNIQUE: Multidetector CT imaging of the head and cervical spine was performed following the standard protocol without intravenous contrast. Multiplanar CT image reconstructions of the cervical spine were also generated. COMPARISON:  CT of the neck from 08/11/2010. FINDINGS: CT HEAD FINDINGS Brain: No evidence of acute large vascular territory infarction, hemorrhage, hydrocephalus, extra-axial collection or mass lesion/mass effect. Mild patchy white matter hypoattenuation, likely related to mild chronic microvascular ischemic disease. 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 Surrounding streak artifact limits evaluation. Intracranial atherosclerosis. Skull: No acute fracture.  Hyperostosis frontalis. Sinuses/Orbits: Partially imaged left maxillary sinus opacification with air-fluid level. Other: No mastoid effusions. CT CERVICAL SPINE FINDINGS Alignment: Mild anterolisthesis of T1 on T2 and T2 on T3, likely degenerative given similar findings on prior CT neck from 2011. Otherwise, no substantial sagittal subluxation. There is reversal of the normal cervical lordosis. Levocurvature of the lower cervical spine, centered at the cervicothoracic junction. Skull base and vertebrae: No evidence of acute fracture. Vertebral body heights are maintained. Diffuse osteopenia. Soft tissues and spinal canal: No prevertebral fluid or swelling. No visible canal hematoma. Disc levels: Severe multilevel degenerative disease in the lower cervical spine with severe disc height loss, endplate sclerosis and posterior disc osteophyte complexes. Bulky multilevel left  greater than right facet arthropathy with likely at least moderate foraminal  stenosis bilaterally at C6-C7 and on the right at C7-T1. Upper chest: Visualized lung apices are clear. Other: Calcific atherosclerosis of the aorta. IMPRESSION: CT head: 1. No evidence of acute intracranial abnormality. 2. 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 3. Left maxillary sinus opacification with air-fluid level, partially imaged. CT cervical spine: 1. No evidence of acute fracture or traumatic malalignment. 2. 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 3. Bulky multilevel left greater than right facet and uncovertebral hypertrophy with likely at least moderate foraminal stenosis bilaterally at C6-C7 and on the right at C7-T1. An MRI could further characterize the canal and foramina if clinically indicated. Electronically Signed   By: Margaretha Sheffield MD   On: 03/23/2021 11:54   CT Cervical Spine Wo Contrast  Result Date: 03/23/2021 CLINICAL DATA:  Poly trauma.  Fall. EXAM: CT HEAD WITHOUT CONTRAST CT CERVICAL SPINE WITHOUT CONTRAST TECHNIQUE: Multidetector CT imaging of the head and cervical spine was performed following the standard protocol without intravenous contrast. Multiplanar CT image reconstructions of the cervical spine were also generated. COMPARISON:  CT of the neck from 08/11/2010. FINDINGS: CT HEAD FINDINGS Brain: No evidence of acute large vascular territory infarction, hemorrhage, hydrocephalus, extra-axial collection or mass lesion/mass effect. Mild patchy white matter hypoattenuation, likely related to mild chronic microvascular ischemic disease. 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 Surrounding streak artifact limits evaluation. Intracranial atherosclerosis. Skull: No acute fracture.  Hyperostosis frontalis. Sinuses/Orbits: Partially imaged left maxillary sinus opacification with air-fluid level. Other: No  mastoid effusions. CT CERVICAL SPINE FINDINGS Alignment: Mild anterolisthesis of T1 on T2 and T2 on T3, likely degenerative given similar findings on prior CT neck from 2011. Otherwise, no substantial sagittal subluxation. There is reversal of the normal cervical lordosis. Levocurvature of the lower cervical spine, centered at the cervicothoracic junction. Skull base and vertebrae: No evidence of acute fracture. Vertebral body heights are maintained. Diffuse osteopenia. Soft tissues and spinal canal: No prevertebral fluid or swelling. No visible canal hematoma. Disc levels: Severe multilevel degenerative disease in the lower cervical spine with severe disc height loss, endplate sclerosis and posterior disc osteophyte complexes. Bulky multilevel left greater than right facet arthropathy with likely at least moderate foraminal stenosis bilaterally at C6-C7 and on the right at C7-T1. Upper chest: Visualized lung apices are clear. Other: Calcific atherosclerosis of the aorta. IMPRESSION: CT head: 1. No evidence of acute intracranial abnormality. 2. 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 3. Left maxillary sinus opacification with air-fluid level, partially imaged. CT cervical spine: 1. No evidence of acute fracture or traumatic malalignment. 2. 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 3. Bulky multilevel left greater than right facet and uncovertebral hypertrophy with likely at least moderate foraminal stenosis bilaterally at C6-C7 and on the right at C7-T1. An MRI could further characterize the canal and foramina if clinically indicated. Electronically Signed   By: Margaretha Sheffield MD   On: 03/23/2021 11:54   DG Hip Unilat W or Wo Pelvis 2-3 Views Right  Result Date: 03/23/2021 CLINICAL DATA:  Right hip pain after fall. EXAM: DG HIP (WITH OR WITHOUT PELVIS) 2-3V RIGHT COMPARISON:  None. FINDINGS: Status post right  total hip arthroplasty. The acetabular and femoral components are well situated. There is  noted cortical irregularity involving the subtrochanteric region concerning for minimally displaced periprosthetic fracture. IMPRESSION: Status post right total hip arthroplasty. Probable minimally displaced periprosthetic fracture is seen involving the proximal right femur. Electronically Signed   By: Marijo Conception M.D.   On: 03/23/2021 11:56    EKG: None obtained in ED  Assessment/Plan Right hip fracture     - admit to inpt, med-surg     - ok to have diet right now     - XR as above     - ortho to formally see in ED; hold on PT/OT eval until they do     - pain control  HTN     - continue ARB, hold HCTZ d/t hyponatremia  Hyponatremia Hypokalemia     - replace K+; check Mg2+     - hold HCTZ for now; follow Na+  Anxiety     - continue home regimen  Asthma     - continue home inhalers  HLD     - continue home regimen  Myasthenia gravis     - continue cellcept  DVT prophylaxis: lovenox  Code Status: DNR  Family Communication: None at bedside  Consults called: EDP spoke with ortho   Status is: Inpatient  Remains inpatient appropriate because:Inpatient level of care appropriate due to severity of illness  Dispo: The patient is from: Home              Anticipated d/c is to:  TBD              Patient currently is not medically stable to d/c.   Difficult to place patient No  Time spent coordinating admission: 60 minutes  [facility]  If 7PM-7AM, please contact night-coverage [email or web address]  03/23/2021, 2:51 PM

## 2021-03-24 DIAGNOSIS — E871 Hypo-osmolality and hyponatremia: Secondary | ICD-10-CM | POA: Diagnosis present

## 2021-03-24 DIAGNOSIS — I10 Essential (primary) hypertension: Secondary | ICD-10-CM

## 2021-03-24 DIAGNOSIS — F419 Anxiety disorder, unspecified: Secondary | ICD-10-CM | POA: Diagnosis present

## 2021-03-24 DIAGNOSIS — E876 Hypokalemia: Secondary | ICD-10-CM | POA: Diagnosis present

## 2021-03-24 DIAGNOSIS — G7000 Myasthenia gravis without (acute) exacerbation: Secondary | ICD-10-CM

## 2021-03-24 DIAGNOSIS — S72001D Fracture of unspecified part of neck of right femur, subsequent encounter for closed fracture with routine healing: Secondary | ICD-10-CM

## 2021-03-24 LAB — BASIC METABOLIC PANEL
Anion gap: 7 (ref 5–15)
BUN: 7 mg/dL — ABNORMAL LOW (ref 8–23)
CO2: 27 mmol/L (ref 22–32)
Calcium: 8.9 mg/dL (ref 8.9–10.3)
Chloride: 99 mmol/L (ref 98–111)
Creatinine, Ser: 0.79 mg/dL (ref 0.44–1.00)
GFR, Estimated: >60 mL/min (ref >60)
Glucose, Bld: 131 mg/dL — ABNORMAL HIGH (ref 70–99)
Potassium: 3.3 mmol/L — ABNORMAL LOW (ref 3.5–5.1)
Sodium: 133 mmol/L — ABNORMAL LOW (ref 135–145)

## 2021-03-24 LAB — CBC
HCT: 35.4 % — ABNORMAL LOW (ref 36.0–46.0)
Hemoglobin: 11.6 g/dL — ABNORMAL LOW (ref 12.0–15.0)
MCH: 28.8 pg (ref 26.0–34.0)
MCHC: 32.8 g/dL (ref 30.0–36.0)
MCV: 87.8 fL (ref 80.0–100.0)
Platelets: 287 10*3/uL (ref 150–400)
RBC: 4.03 MIL/uL (ref 3.87–5.11)
RDW: 13.2 % (ref 11.5–15.5)
WBC: 7.4 10*3/uL (ref 4.0–10.5)
nRBC: 0 % (ref 0.0–0.2)

## 2021-03-24 LAB — MAGNESIUM: Magnesium: 1.8 mg/dL (ref 1.7–2.4)

## 2021-03-24 MED ORDER — MYCOPHENOLATE MOFETIL 500 MG PO TABS
1000.0000 mg | ORAL_TABLET | Freq: Two times a day (BID) | ORAL | Status: DC
  Administered 2021-03-24 – 2021-03-25 (×3): 1000 mg via ORAL

## 2021-03-24 MED ORDER — ADULT MULTIVITAMIN W/MINERALS CH
1.0000 | ORAL_TABLET | Freq: Every day | ORAL | Status: DC
  Administered 2021-03-24 – 2021-03-25 (×2): 1 via ORAL
  Filled 2021-03-24 (×2): qty 1

## 2021-03-24 NOTE — Evaluation (Signed)
Physical Therapy Evaluation Patient Details Name: Sabrina [name] MRN: [id number] DOB: 1941-03-22 Today's Date: 03/24/2021   History of Present Illness  80 y.o. female  Presenting with right hip pain after fall. Imaging showed a periprosthetic fracture, plan is non operative, conservative management, NWB RLE. Pt with medical history significant of arthritis, anxiety, hypothyroidism, R THA, myasthenia gravis  Clinical Impression  Pt admitted with above diagnosis. +2 max assist for sit to stand, +2 mod assist to pivot to bedside commode, then to recliner. Pt requires frequent cues to maintain NWB status on RLE. ST-SNF recommended.  Pt currently with functional limitations due to the deficits listed below (see PT Problem List). Pt will benefit from skilled PT to increase their independence and safety with mobility to allow discharge to the venue listed below.       Follow Up Recommendations SNF;Supervision/Assistance - 24 hour;Supervision for mobility/OOB    Equipment Recommendations  None recommended by PT    Recommendations for Other Services       Precautions / Restrictions Precautions Precautions: Fall Precaution Comments: 1 other fall in the past year Restrictions Weight Bearing Restrictions: Yes RLE Weight Bearing: Non weight bearing      Mobility  Bed Mobility Overal bed mobility: Needs Assistance Bed Mobility: Supine to Sit     Supine to sit: Min assist     General bed mobility comments: min A to support RLE and to raise trunk, used bedrail to pull up    Transfers Overall transfer level: Needs assistance Equipment used: Rolling walker (2 wheeled) Transfers: Sit to/from [employer] Transfers;Stand Pivot Transfers Sit to Stand: +2 physical assistance;Max assist Stand pivot transfers: +2 physical assistance;Mod assist Squat pivot transfers: +2 physical assistance;Mod assist     General transfer comment: assist to power up, pt pivoted from bed to 3 in 1 to  recliner, frequent reminders for NWB status required  Ambulation/Gait                Stairs            Wheelchair Mobility    Modified Rankin (Stroke Patients Only)       Balance Overall balance assessment: Needs assistance Sitting-balance support: Feet supported;No upper extremity supported Sitting balance-Leahy Scale: Fair     Standing balance support: Bilateral upper extremity supported Standing balance-Leahy Scale: Zero Standing balance comment: requires +2 assist in standing                             Pertinent Vitals/Pain Pain Assessment: 0-10 Pain Score: 10-Worst pain ever Pain Location: R hip with activity    Home Living Family/patient expects to be discharged to:: Skilled nursing facility Living Arrangements: Children Available Help at Discharge: Family;Available 24 hours/day           Home Equipment: Walker - 4 wheels;Bedside commode;Hand held shower head Additional Comments: lives with daughter who is retired Therapist, sports,    Prior Function Level of Independence: Needs Water engineer / Transfers Assistance Needed: walks with rollator, can do dishes/make bed  ADL's / Homemaking Assistance Needed: daughter assists with shower transfer, sits on 3 in 1 in shower        Hand Dominance        Extremity/Trunk Assessment   Upper Extremity Assessment Upper Extremity Assessment: Defer to OT evaluation    Lower Extremity Assessment Lower Extremity Assessment: RLE deficits/detail RLE Deficits / Details: tolerated hip flexion AAROM of ~25* and hip  ABDuction of ~10*, knee ext at least 3/5 RLE: Unable to fully assess due to pain RLE Sensation: WNL RLE Coordination: WNL    Cervical / Trunk Assessment Cervical / Trunk Assessment: Normal  Communication   Communication: No difficulties  Cognition Arousal/Alertness: Awake/alert Behavior During Therapy: WFL for tasks assessed/performed Overall Cognitive Status: Within Functional Limits  for tasks assessed                                        General Comments      Exercises General Exercises - Lower Extremity Ankle Circles/Pumps: AROM;Both;10 reps;Supine Heel Slides: AAROM;Right;5 reps;Supine Hip ABduction/ADduction: AAROM;Right;5 reps;Supine   Assessment/Plan    PT Assessment Patient needs continued PT services  PT Problem List Decreased strength;Decreased mobility;Decreased activity tolerance;Decreased balance;Decreased knowledge of use of DME;Pain       PT Treatment Interventions Therapeutic activities;Gait training;Therapeutic exercise;Balance training;Functional mobility training;Patient/family education    PT Goals (Current goals can be found in the Care Plan section)  Acute Rehab PT Goals Patient Stated Goal: pt is agreeable to rehab PT Goal Formulation: With patient Time For Goal Achievement: 04/07/21 Potential to Achieve Goals: Fair    Frequency Min 3X/week   Barriers to discharge        Co-evaluation PT/OT/SLP Co-Evaluation/Treatment: Yes Reason for Co-Treatment: For patient/therapist safety;Complexity of the patient's impairments (multi-system involvement);To address functional/ADL transfers PT goals addressed during session: Mobility/safety with mobility;Balance;Proper use of DME;Strengthening/ROM         AM-PAC PT "6 Clicks" Mobility  Outcome Measure Help needed turning from your back to your side while in a flat bed without using bedrails?: A Lot Help needed moving from lying on your back to sitting on the side of a flat bed without using bedrails?: Total Help needed moving to and from a bed to a chair (including a wheelchair)?: Total Help needed standing up from a chair using your arms (e.g., wheelchair or bedside chair)?: Total Help needed to walk in hospital room?: Total Help needed climbing 3-5 steps with a railing? : Total 6 Click Score: 7    End of Session Equipment Utilized During Treatment: Gait  belt Activity Tolerance: Patient limited by pain;Patient limited by fatigue Patient left: in chair;with call bell/phone within reach;with chair alarm set Nurse Communication: Mobility status PT Visit Diagnosis: Difficulty in walking, not elsewhere classified (R26.2);Pain;History of falling (Z91.81);Muscle weakness (generalized) (M62.81) Pain - Right/Left: Right Pain - part of body: Hip    Time: 1610-9604 PT Time Calculation (min) (ACUTE ONLY): 26 min   Charges:   PT Evaluation $PT Eval High Complexity: 1 High         Blondell Reveal Kistler PT 03/24/2021  Acute Rehabilitation Services Pager [phone] Office [phone]

## 2021-03-24 NOTE — Progress Notes (Signed)
Initial Nutrition Assessment  DOCUMENTATION CODES:   Not applicable  INTERVENTION:   -Liberalize diet to regular -MVI with minerals daily  NUTRITION DIAGNOSIS:   Increased nutrient needs related to acute illness as evidenced by estimated needs.  GOAL:   Patient will meet greater than or equal to 90% of their needs  MONITOR:   PO intake, Supplement acceptance, Labs, Weight trends, Skin, I & O's  REASON FOR ASSESSMENT:   Consult Assessment of nutrition requirement/status, Hip fracture protocol  ASSESSMENT:   Sabrina [name] is a 80 y.o. female with medical history significant of arthritis, anxiety, hypothyroidism. Presenting with right hip pain after fall.  Pt admitted with rt hip fracture s/p fall.   Reviewed I/O's: -120 ml x 24 hours  UOP: 900  Pt unavailable at time of visit. Attempted to speak with pt via call to hospital room phone, however, unable to reach. RD unable to obtain further nutrition-related history or complete nutrition-focused physical exam at this time.    Per orthopedics notes, plan for non-operative management.   Pt with good appetite. Noted meal completion 100%.   Reviewed wt hx; pt has experienced a 2.7% wt loss over the past 9 months, which is not significant for time frame.   Medications reviewed and include vitamin D3, lovaza, and potassium chloride.  Labs reviewed: Na: 133, K: 3.3 (on PO supplementation).   Diet Order:   Diet Order             Diet Heart Room service appropriate? Yes; Fluid consistency: Thin  Diet effective now                   EDUCATION NEEDS:   No education needs have been identified at this time  Skin:  Skin Assessment: Skin Integrity Issues: Other: lt arm skin tear  Last BM:  03/24/21  Height:   Ht Readings from Last 1 Encounters:  03/23/21 5\' 5"  (1.651 m)    Weight:   Wt Readings from Last 1 Encounters:  03/23/21 81.6 kg    Ideal Body Weight:  56.8 kg  BMI:  Body mass index is 29.94  kg/m.  Estimated Nutritional Needs:   Kcal:  1600-1800  Protein:  85-100 grams  Fluid:  > 1.6 L    Loistine Chance, RD, LDN, [place] Registered Dietitian II Certified Diabetes Care and Education Specialist Please refer to [facility] for RD and/or RD on-call/weekend/after hours pager

## 2021-03-24 NOTE — Progress Notes (Addendum)
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  Plain films of the pelvis and right hip with probable minimally displaced periprosthetic fracture involving the proximal right femur.  Patient admitted.  Orthopedics consulted and recommending conservative management with nonweightbearing and likely need SNF placement.   Assessment & Plan:   Principal Problem:   Hip fracture ([place]) Active Problems:   Essential hypertension   Myasthenia gravis, 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HCC)   Hyponatremia   Hypokalemia   Anxiety   #1 minimally displaced right periprosthetic hip fracture -Secondary to mechanical fall. -Patient seen in consultation by orthopedics who discussed with patient who would prefer nonoperative treatment at this time. -Per orthopedics reasonable for nonoperative treatment at this time with nonweightbearing to the right hip for likely 4 to 6 weeks to allow for adequate healing. -PT/OT. -We will need SNF placement. -Per orthopedics.  2.  Hypertension -Controlled on current regimen of Avapro. -Continue to hold HCTZ.  3.  Hypokalemia -Replete.  4.  Hyponatremia -HCTZ on hold. -Follow.  5.  Hyperlipidemia -Statin.  6.  Myasthenia gravis -Continue home regimen CellCept.  7.  Asthma -Stable. -Continue Breo Ellipta, Singulair.  8.  Anxiety -Continue Prozac, Xanax.   DVT prophylaxis: Lovenox Code Status: Full Family Communication: Updated patient and daughter-in-law at bedside. Disposition:   Status is: Inpatient  Remains inpatient appropriate because:Unsafe d/c plan  Dispo: The patient is from:  Home              Anticipated d/c is to: SNF              Patient currently is medically stable to d/c.   Difficult to place patient No       Consultants:  Orthopedics: Dr. Ninfa Linden  Procedures:  CT head CT C-spine 03/23/2021 Plain films of the pelvis and right hip 03/23/2021  Antimicrobials:  None   Subjective: Patient laying in bed.  Denies chest pain.  No shortness of breath.  No abdominal pain.  States as long as she lays still not a significant pain.  In agreement with conservative treatment at this time.  Daughter-in-law at bedside.  Objective: Vitals:   03/24/21 0107 03/24/21 0519 03/24/21 0950 03/24/21 1315  BP: (!) 154/67 (!) 141/59 139/71 138/66  Pulse: 65 61 91 82  Resp: 18 14 18 16   Temp: 97.9 F (36.6 C) 98.3 F (36.8 C) 98 F (36.7 C) 98.5 F (36.9 C)  TempSrc: Oral Oral Oral Oral  SpO2: 94% 97% 96% 94%  Weight:      Height:        Intake/Output Summary (Last 24 hours) at 03/24/2021 1907 Last data filed at 03/24/2021 1700 [place] per 24 hour  Intake 1020 ml  Output 1400 ml  Net -380 ml   Filed Weights   03/23/21 0953 03/23/21 1636  Weight: 81.6 kg 81.6 kg    Examination:  General exam: Appears calm and comfortable  Respiratory system: Clear to auscultation. Respiratory effort normal. Cardiovascular system: S1 & S2 heard, RRR. No JVD, murmurs, rubs, gallops or clicks. No pedal edema. Gastrointestinal system: Abdomen is nondistended, soft and nontender. No organomegaly or masses felt. Normal bowel sounds  heard. Central nervous system: Alert and oriented. No focal neurological deficits. Extremities: Symmetric 5 x 5 power. Skin: No rashes, lesions or ulcers Psychiatry: Judgement and insight appear normal. Mood & affect appropriate.     Data Reviewed: I have personally reviewed following labs and imaging studies  CBC: Recent Labs  Lab 03/23/21 1046 03/24/21 0254  WBC 11.2* 7.4  HGB 13.2 11.6*  HCT 40.0 35.4*  MCV 85.7 87.8  PLT 326 287     Basic Metabolic Panel: Recent Labs  Lab 03/23/21 1046 03/24/21 0254  NA 134* 133*  K 3.3* 3.3*  CL 95* 99  CO2 27 27  GLUCOSE 133* 131*  BUN 7* 7*  CREATININE 0.54 0.79  CALCIUM 9.9 8.9  MG 1.9 1.8    GFR: Estimated Creatinine Clearance: 59.1 mL/min (by C-G formula based on SCr of 0.79 mg/dL).  Liver Function Tests: No results for input(s): AST, ALT, ALKPHOS, BILITOT, PROT, ALBUMIN in the last 168 hours.  CBG: No results for input(s): GLUCAP in the last 168 hours.   Recent Results (from the past 240 hour(s))  Resp Panel by RT-PCR (Flu A&B, Covid) Nasopharyngeal Swab     Status: None   Collection Time: 03/23/21  1:21 PM   Specimen: Nasopharyngeal Swab; Nasopharyngeal(NP) swabs in vial transport medium  Result Value Ref Range Status   SARS Coronavirus 2 by RT PCR NEGATIVE NEGATIVE Final    Comment: (NOTE) SARS-CoV-2 target nucleic acids are NOT DETECTED.  The SARS-CoV-2 RNA is generally detectable in upper respiratory specimens during the acute phase of infection. The lowest concentration of SARS-CoV-2 viral copies this assay can detect is 138 copies/mL. A negative result does not preclude SARS-Cov-2 infection and should not be used as the sole basis for treatment or other patient management decisions. A negative result may occur with  improper specimen collection/handling, submission of specimen other than nasopharyngeal swab, presence of viral mutation(s) within the areas targeted by this assay, and inadequate number of viral copies(<138 copies/mL). A negative result must be combined with clinical observations, patient history, and epidemiological information. The expected result is Negative.  Fact Sheet for Patients:  EntrepreneurPulse.com.au  Fact Sheet for Healthcare Providers:  IncredibleEmployment.be  This test is no t yet approved or cleared by the [place] FDA and  has been authorized for detection and/or  diagnosis of SARS-CoV-2 by FDA under an Emergency Use Authorization (EUA). This EUA will remain  in effect (meaning this test can be used) for the duration of the COVID-19 declaration under Section 564(b)(1) of the Act, 21 U.S.C.section 360bbb-3(b)(1), unless the authorization is terminated  or revoked sooner.       Influenza A by PCR NEGATIVE NEGATIVE Final   Influenza B by PCR NEGATIVE NEGATIVE Final    Comment: (NOTE) The Xpert Xpress SARS-CoV-2/FLU/RSV plus assay is intended as an aid in the diagnosis of influenza from Nasopharyngeal swab specimens and should not be used as a sole basis for treatment. Nasal washings and aspirates are unacceptable for Xpert Xpress SARS-CoV-2/FLU/RSV testing.  Fact Sheet for Patients: EntrepreneurPulse.com.au  Fact Sheet for Healthcare Providers: IncredibleEmployment.be  This test is not yet approved or cleared by the [place] FDA and has been authorized for detection and/or diagnosis of SARS-CoV-2 by FDA under an Emergency Use Authorization (EUA). This EUA will remain in effect (meaning this test can be used) for the duration of the COVID-19 declaration under Section 564(b)(1) of the Act, 21 U.S.C. section 360bbb-3(b)(1), unless the authorization is terminated or revoked.  Performed  at [facility], [place] [address], [place], [place] [address]          Radiology Studies: CT Head Wo Contrast  Result Date: 03/23/2021 CLINICAL DATA:  Poly trauma.  Fall. EXAM: CT HEAD WITHOUT CONTRAST CT CERVICAL SPINE WITHOUT CONTRAST TECHNIQUE: Multidetector CT imaging of the head and cervical spine was performed following the standard protocol without intravenous contrast. Multiplanar CT image reconstructions of the cervical spine were also generated. COMPARISON:  CT of the neck from 08/11/2010. FINDINGS: CT HEAD FINDINGS Brain: No evidence of acute large vascular territory infarction, hemorrhage,  hydrocephalus, extra-axial collection or mass lesion/mass effect. Mild patchy white matter hypoattenuation, likely related to mild chronic microvascular ischemic disease. 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 Surrounding streak artifact limits evaluation. Intracranial atherosclerosis. Skull: No acute fracture.  Hyperostosis frontalis. Sinuses/Orbits: Partially imaged left maxillary sinus opacification with air-fluid level. Other: No mastoid effusions. CT CERVICAL SPINE FINDINGS Alignment: Mild anterolisthesis of T1 on T2 and T2 on T3, likely degenerative given similar findings on prior CT neck from 2011. Otherwise, no substantial sagittal subluxation. There is reversal of the normal cervical lordosis. Levocurvature of the lower cervical spine, centered at the cervicothoracic junction. Skull base and vertebrae: No evidence of acute fracture. Vertebral body heights are maintained. Diffuse osteopenia. Soft tissues and spinal canal: No prevertebral fluid or swelling. No visible canal hematoma. Disc levels: Severe multilevel degenerative disease in the lower cervical spine with severe disc height loss, endplate sclerosis and posterior disc osteophyte complexes. Bulky multilevel left greater than right facet arthropathy with likely at least moderate foraminal stenosis bilaterally at C6-C7 and on the right at C7-T1. Upper chest: Visualized lung apices are clear. Other: Calcific atherosclerosis of the aorta. IMPRESSION: CT head: 1. No evidence of acute intracranial abnormality. 2. 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 3. Left maxillary sinus opacification with air-fluid level, partially imaged. CT cervical spine: 1. No evidence of acute fracture or traumatic malalignment. 2. 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 3. Bulky multilevel left greater  than right facet and uncovertebral hypertrophy with likely at least moderate foraminal stenosis bilaterally at C6-C7 and on the right at C7-T1. An MRI could further characterize the canal and foramina if clinically indicated. Electronically Signed   By: Margaretha Sheffield MD   On: 03/23/2021 11:54   CT Cervical Spine Wo Contrast  Result Date: 03/23/2021 CLINICAL DATA:  Poly trauma.  Fall. EXAM: CT HEAD WITHOUT CONTRAST CT CERVICAL SPINE WITHOUT CONTRAST TECHNIQUE: Multidetector CT imaging of the head and cervical spine was performed following the standard protocol without intravenous contrast. Multiplanar CT image reconstructions of the cervical spine were also generated. COMPARISON:  CT of the neck from 08/11/2010. FINDINGS: CT HEAD FINDINGS Brain: No evidence of acute large vascular territory infarction, hemorrhage, hydrocephalus, extra-axial collection or mass lesion/mass effect. Mild patchy white matter hypoattenuation, likely related to mild chronic microvascular ischemic disease. 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 Surrounding streak artifact limits evaluation. Intracranial atherosclerosis. Skull: No acute fracture.  Hyperostosis frontalis. Sinuses/Orbits: Partially imaged left maxillary sinus opacification with air-fluid level. Other: No mastoid effusions. CT CERVICAL SPINE FINDINGS Alignment: Mild anterolisthesis of T1 on T2 and T2 on T3, likely degenerative given similar findings on prior CT neck from 2011. Otherwise, no substantial sagittal subluxation. There is reversal of the normal  cervical lordosis. Levocurvature of the lower cervical spine, centered at the cervicothoracic junction. Skull base and vertebrae: No evidence of acute fracture. Vertebral body heights are maintained. Diffuse osteopenia. Soft tissues and spinal canal: No prevertebral fluid or swelling. No visible canal hematoma. Disc levels: Severe multilevel  degenerative disease in the lower cervical spine with severe disc height loss, endplate sclerosis and posterior disc osteophyte complexes. Bulky multilevel left greater than right facet arthropathy with likely at least moderate foraminal stenosis bilaterally at C6-C7 and on the right at C7-T1. Upper chest: Visualized lung apices are clear. Other: Calcific atherosclerosis of the aorta. IMPRESSION: CT head: 1. No evidence of acute intracranial abnormality. 2. 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 3. Left maxillary sinus opacification with air-fluid level, partially imaged. CT cervical spine: 1. No evidence of acute fracture or traumatic malalignment. 2. 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 3. Bulky multilevel left greater than right facet and uncovertebral hypertrophy with likely at least moderate foraminal stenosis bilaterally at C6-C7 and on the right at C7-T1. An MRI could further characterize the canal and foramina if clinically indicated. Electronically Signed   By: Margaretha Sheffield MD   On: 03/23/2021 11:54   DG Hip Unilat W or Wo Pelvis 2-3 Views Right  Result Date: 03/23/2021 CLINICAL DATA:  Right hip pain after fall. EXAM: DG HIP (WITH OR WITHOUT PELVIS) 2-3V RIGHT COMPARISON:  None. FINDINGS: Status post right total hip arthroplasty. The acetabular and femoral components are well situated. There is noted cortical irregularity involving the subtrochanteric region concerning for minimally displaced periprosthetic fracture. IMPRESSION: Status post right total hip arthroplasty. Probable minimally displaced periprosthetic fracture is seen involving the proximal right femur. Electronically Signed   By: Marijo Conception M.D.   On: 03/23/2021 11:56        Scheduled Meds:  ALPRAZolam  0.5 mg Oral QID   cholecalciferol  1,000 Units Oral q morning   enoxaparin (LOVENOX) injection  40 mg Subcutaneous  Q24H   FLUoxetine  20 mg Oral q morning   fluticasone  2 spray Each Nare Daily   fluticasone furoate-vilanterol  1 puff Inhalation Daily   And   umeclidinium bromide  1 puff Inhalation Daily   irbesartan  150 mg Oral Daily   levothyroxine  75 mcg Oral Q0600   montelukast  10 mg Oral QHS   multivitamin with minerals  1 tablet Oral Daily   mycophenolate  1,000 mg Oral BID   omega-3 acid ethyl esters  1,000 mg Oral Daily   traZODone  75 mg Oral QHS   Continuous Infusions:   LOS: 1 day    Time spent: 35 mins    Irine Seal, MD [facility]   To contact the attending provider between 7A-7P or the covering provider during after hours 7P-7A, please log into the web site [email or web address] and access using universal [place] password for that web site. If you do not have the password, please call the hospital operator.  03/24/2021, 7:07 PM

## 2021-03-24 NOTE — TOC Initial Note (Signed)
Transition of Care [facility]) - Initial/Assessment Note    Patient Details  Name: Sabrina [name] MRN: [id number] Date of Birth: 1941/05/04  Transition of Care [facility]) CM/SW Contact:    Lennart Pall, LCSW Phone Number: 03/24/2021, 2:13 PM  Clinical Narrative:                 Met with pt and spoke with daughter, Vickii Chafe, over phone today to introduce self/ TOC role.  Pt very pleasant but admits frustration with her situation.  She is very motivated to regain her independence and return home.  She and daughter both feel she needs to complete rehab at SNF prior to her return as she does live alone with intermittent support of family.  Pt has been to SNF x 2 in past [facility]) but does not have a preferred facility.  Pt/ daughter confirm pt has had COVID vaccine x 2 and booster x 1.  Have completed FL2 and bed search begun.  Continue to follow.  Expected Discharge Plan: Skilled Nursing Facility Barriers to Discharge: Continued Medical Work up   Patient Goals and CMS Choice Patient states their goals for this hospitalization and ongoing recovery are:: eventually return home following SNF rehab CMS Medicare.gov Compare Post Acute Care list provided to:: Patient Choice offered to / list presented to : Patient, Adult Children  Expected Discharge Plan and Services Expected Discharge Plan: [place] In-house Referral: Clinical Social Work   Post Acute Care Choice: [place] Living arrangements for the past 2 months: [place]                 DME Arranged: N/A DME Agency: NA                  Prior Living Arrangements/Services Living arrangements for the past 2 months: Single Family Home Lives with:: Self Patient language and need for interpreter reviewed:: Yes Do you feel safe going back to the place where you live?: Yes      Need for Family Participation in Patient Care: Yes (Comment) Care giver support system in place?: Yes (comment)   Criminal  Activity/Legal Involvement Pertinent to Current Situation/Hospitalization: No - Comment as needed  Activities of Daily Living Home Assistive Devices/Equipment: Bedside commode/3-in-1, Walker (specify type), Cane (specify quad or straight) (Rollator walker) ADL Screening (condition at time of admission) Patient's cognitive ability adequate to safely complete daily activities?: Yes Is the patient deaf or have difficulty hearing?: No Does the patient have difficulty seeing, even when wearing glasses/contacts?: No Does the patient have difficulty concentrating, remembering, or making decisions?: No Patient able to express need for assistance with ADLs?: Yes Does the patient have difficulty dressing or bathing?: No Independently performs ADLs?: Yes (appropriate for developmental age) Does the patient have difficulty walking or climbing stairs?: No Weakness of Legs: Right Weakness of Arms/Hands: None  Permission Sought/Granted Permission sought to share information with : Family Supports Permission granted to share information with : Yes, Verbal Permission Granted  Share Information with NAME: Salvadore Oxford     Permission granted to share info w Relationship: daughter  Permission granted to share info w Contact Information: [phone]  Emotional Assessment Appearance:: Appears stated age Attitude/Demeanor/Rapport: Engaged, Gracious Affect (typically observed): Accepting, Pleasant Orientation: : Oriented to Self, Oriented to Place, Oriented to  Time, Oriented to Situation Alcohol / Substance Use: Not Applicable Psych Involvement: No (comment)  Admission diagnosis:  Hip fracture ([place]) [S72.009A] Fracture, proximal femur, right, closed, initial encounter ([place]) [  S72.001A] Patient Active Problem List   Diagnosis Date Noted   Hip fracture ([place]) 03/23/2021   Unilateral primary osteoarthritis, left knee 11/26/2017   Lumbar stenosis 06/18/2015   Essential hypertension 02/20/2014    Bradycardia 02/20/2014   Swelling of left lower extremity 02/20/2014   Rotator cuff arthropathy 10/16/2012   Carpal tunnel syndrome 10/16/2012   Carpal tunnel syndrome of right wrist 05/31/2012   Rotator cuff impingement syndrome 12/22/2011   Irreducible incisional hernia 03/15/2011   Myasthenia gravis, 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[place]) 12/03/2004   PCP:  Ginger Organ., MD Pharmacy:   [id number] - [address], [address] AT [address] [place] [address] Phone: [phone] Fax: [phone]     Social Determinants of Health (SDOH) Interventions    Readmission Risk Interventions No flowsheet data found.

## 2021-03-24 NOTE — NC FL2 (Addendum)
[place] MEDICAID FL2 LEVEL OF CARE SCREENING TOOL     IDENTIFICATION  Patient Name: Sabrina [name] Birthdate: 05-16-1941 Sex: female Admission Date (Current Location): 03/23/2021  [facility] and [place] Number:  Herbalist and Address:  [facility],  [place], [place]      Provider Number: [id number]  Attending Physician Name and Address:  Eugenie Filler, MD  Relative Name and Phone Number:  daughter, Salvadore Oxford @ [phone]    Current Level of Care: Hospital Recommended Level of Care: [place] Prior Approval Number:    Date Approved/Denied:   PASRR Number: [id number]  Discharge Plan: SNF    Current Diagnoses: Patient Active Problem List   Diagnosis Date Noted   Hip fracture ([place]) 03/23/2021   Unilateral primary osteoarthritis, left knee 11/26/2017   Lumbar stenosis 06/18/2015   Essential hypertension 02/20/2014   Bradycardia 02/20/2014   Swelling of left lower extremity 02/20/2014   Rotator cuff arthropathy 10/16/2012   Carpal tunnel syndrome 10/16/2012   Carpal tunnel syndrome of right wrist 05/31/2012   Rotator cuff impingement syndrome 12/22/2011   Irreducible incisional hernia 03/15/2011   Myasthenia gravis, 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[place]) 12/03/2004    Orientation RESPIRATION BLADDER Height & Weight     Self, Time, Situation, Place  Normal Continent, External catheter (currently with purewick) Weight: 179 lb 14.3 oz (81.6 kg) Height:  5\' 5"  (165.1 cm)  BEHAVIORAL SYMPTOMS/MOOD NEUROLOGICAL BOWEL NUTRITION STATUS      Continent    AMBULATORY STATUS COMMUNICATION OF NEEDS Skin   Limited Assist Verbally Normal                       Personal Care Assistance Level of Assistance  Bathing, Dressing Bathing Assistance: Limited assistance   Dressing Assistance: Limited assistance     Functional Limitations Info             SPECIAL CARE FACTORS FREQUENCY  PT (By licensed PT), OT (By  licensed OT)     PT Frequency: 5x/wk OT Frequency: 5x/wk            Contractures Contractures Info: Not present    Additional Factors Info  Code Status, Allergies, Psychotropic Code Status Info: Full Allergies Info: see MAR Psychotropic Info: see MAR         Current Medications (03/24/2021):  This is the current hospital active medication list Current Facility-Administered Medications  Medication Dose Route Frequency Provider Last Rate Last Admin   ALPRAZolam Duanne Moron) tablet 0.5 mg  0.5 mg Oral QID Marylyn Ishihara, Tyrone A, DO   0.5 mg at 03/24/21 1036   cholecalciferol (VITAMIN D3) tablet 1,000 Units  1,000 Units Oral q morning Marylyn Ishihara, Tyrone A, DO   1,000 Units at 03/24/21 1036   enoxaparin (LOVENOX) injection 40 mg  40 mg Subcutaneous Q24H Kyle, Tyrone A, DO   40 mg at 03/23/21 2116   FLUoxetine (PROZAC) capsule 20 mg  20 mg Oral q morning Marylyn Ishihara, Tyrone A, DO   20 mg at 03/24/21 1035   fluticasone (FLONASE) 50 MCG/ACT nasal spray 2 spray  2 spray Each Nare Daily Kyle, Tyrone A, DO   2 spray at 03/24/21 1035   fluticasone furoate-vilanterol (BREO ELLIPTA) 200-25 MCG/INH 1 puff  1 puff Inhalation Daily Kyle, Tyrone A, DO       And   umeclidinium bromide (INCRUSE ELLIPTA) 62.5 MCG/INH 1 puff  1 puff Inhalation Daily Kyle, Tyrone A, DO  irbesartan (AVAPRO) tablet 150 mg  150 mg Oral Daily Kyle, Tyrone A, DO   150 mg at 03/24/21 1032   lactase (LACTAID) tablet 3,000 Units  3,000 Units Oral PRN Marylyn Ishihara, Tyrone A, DO       levothyroxine (SYNTHROID) tablet 75 mcg  75 mcg Oral Q0600 Marylyn Ishihara, Tyrone A, DO   75 mcg at 03/24/21 0611   montelukast (SINGULAIR) tablet 10 mg  10 mg Oral QHS Kyle, Tyrone A, DO   10 mg at 03/23/21 2116   morphine 2 MG/ML injection 0.5 mg  0.5 mg Intravenous Q2H PRN Marylyn Ishihara, Tyrone A, DO       multivitamin with minerals tablet 1 tablet  1 tablet Oral Daily Eugenie Filler, MD       mycophenolate (CELLCEPT) tablet 1,000 mg  1,000 mg Oral BID Eugenie Filler, MD   1,000 mg at  03/24/21 1037   omega-3 acid ethyl esters (LOVAZA) capsule 1,000 mg  1,000 mg Oral Daily Marylyn Ishihara, Tyrone A, DO   1,000 mg at 03/24/21 1033   oxyCODONE-acetaminophen (PERCOCET/ROXICET) 5-325 MG per tablet 1 tablet  1 tablet Oral QID PRN Cherylann Ratel A, DO   1 tablet at 03/24/21 1033   traZODone (DESYREL) tablet 75 mg  75 mg Oral QHS Kyle, Tyrone A, DO   75 mg at 03/23/21 2116     Discharge Medications: Please see discharge summary for a list of discharge medications.  Relevant Imaging Results:  Relevant Lab Results:   Additional Information SS# [id number];  pt has had COVID vaccine x 2 and booster x 1  Magdelyn Roebuck, LCSW

## 2021-03-24 NOTE — Plan of Care (Signed)
  Problem: Education: Goal: Knowledge of General Education information will improve Description Including pain rating scale, medication(s)/side effects and non-pharmacologic comfort measures Outcome: Progressing   

## 2021-03-24 NOTE — Evaluation (Signed)
Occupational Therapy Evaluation Patient Details Name: Sabrina [name] MRN: [id number] DOB: 02-04-1941 Today's Date: 03/24/2021    History of Present Illness 80 y.o. female  Presenting with right hip pain after fall. Imaging showed a periprosthetic fracture, plan is non operative, conservative management, NWB RLE. Pt with medical history significant of arthritis, anxiety, hypothyroidism, R THA, myasthenia gravis   Clinical Impression   Sabrina [name] is an 73 year woman s/p fall at home with a periprosthetic fracture and non weight bearing status. On evaluation patient presents with decreased ROM and strength of RLE, decreased activity tolerance, impaired balance and complaints of pain resulting in a sudden decline in functional abilities. Currently patient requiring +2 for pivoting and max assist for LB ADLs and toileting. Patient exhibits functional upper body strength for typical ADLs and mobility but not for prolonged use on walker to maintain NWB status. Patient will benefit from skilled OT services while in hospital to improve deficits and learn compensatory strategies as needed in order to return PLOF.  Recommend short term rehab at discharge.    Follow Up Recommendations  SNF    Equipment Recommendations  None recommended by OT    Recommendations for Other Services       Precautions / Restrictions Precautions Precautions: Fall Precaution Comments: 1 other fall in the past year Restrictions Weight Bearing Restrictions: Yes RLE Weight Bearing: Non weight bearing      Mobility Bed Mobility Overal bed mobility: Needs Assistance Bed Mobility: Supine to Sit     Supine to sit: Min assist     General bed mobility comments: min A to support RLE and to raise trunk, used bedrail to pull up    Transfers Overall transfer level: Needs assistance Equipment used: Rolling walker (2 wheeled) Transfers: Sit to/from [employer] Transfers;Stand Pivot Transfers Sit to Stand: +2  physical assistance;Max assist Stand pivot transfers: +2 physical assistance;Mod assist Squat pivot transfers: +2 physical assistance;Mod assist     General transfer comment: assist to power up, pt pivoted from bed to 3 in 1 to recliner, frequent reminders for NWB status required. Stood with RW but unable to maintain standing position and walker for pivot. perfomred squat pivot to [facility] and then to recliner.    Balance Overall balance assessment: Needs assistance Sitting-balance support: Feet supported;No upper extremity supported Sitting balance-Leahy Scale: Fair     Standing balance support: Bilateral upper extremity supported Standing balance-Leahy Scale: Zero Standing balance comment: requires +2 assist in standing                           ADL either performed or assessed with clinical judgement   ADL Overall ADL's : Needs assistance/impaired Eating/Feeding: Independent   Grooming: Set up;Sitting   Upper Body Bathing: Set up;Sitting   Lower Body Bathing: Moderate assistance;Sit to/from stand;+2 for safety/equipment   Upper Body Dressing : Set up;Sitting   Lower Body Dressing: Maximal assistance;Sitting/lateral leans   Toilet Transfer: Moderate assistance;+2 for safety/equipment;+2 for physical assistance;BSC   Toileting- Clothing Manipulation and Hygiene: Maximal assistance;Sitting/lateral lean       Functional mobility during ADLs: +2 for physical assistance;+2 for safety/equipment;Moderate assistance       Vision Patient Visual Report: No change from baseline       Perception     Praxis      Pertinent Vitals/Pain Pain Assessment: Faces Pain Score: 10-Worst pain ever Faces Pain Scale: Hurts even more Pain Location: R hip with activity Pain  Descriptors / Indicators: Grimacing;Guarding;Discomfort;Restless Pain Intervention(s): Limited activity within patient's tolerance;Monitored during session;Premedicated before session     Hand Dominance      Extremity/Trunk Assessment Upper Extremity Assessment Upper Extremity Assessment: RUE deficits/detail;LUE deficits/detail RUE Deficits / Details: WFL ROM - shoulder ROm decreased secondary to prior shoulder replacement, shoulder strength 3+/5, otherwise 4+/5 RUE Sensation: WNL RUE Coordination: WNL LUE Deficits / Details: WFL ROM and strength 4+/5 LUE Sensation: WNL LUE Coordination: WNL   Lower Extremity Assessment Lower Extremity Assessment: Defer to PT evaluation RLE Deficits / Details: tolerated hip flexion AAROM of ~25* and hip ABDuction of ~10*, knee ext at least 3/5 RLE: Unable to fully assess due to pain RLE Sensation: WNL RLE Coordination: WNL   Cervical / Trunk Assessment Cervical / Trunk Assessment: Normal   Communication Communication Communication: No difficulties   Cognition Arousal/Alertness: Awake/alert Behavior During Therapy: WFL for tasks assessed/performed Overall Cognitive Status: Within Functional Limits for tasks assessed                                     General Comments       Exercises General Exercises - Lower Extremity Ankle Circles/Pumps: AROM;Both;10 reps;Supine Heel Slides: AAROM;Right;5 reps;Supine Hip ABduction/ADduction: AAROM;Right;5 reps;Supine   Shoulder Instructions      Home Living Family/patient expects to be discharged to:: Skilled nursing facility Living Arrangements: Children Available Help at Discharge: Family;Available 24 hours/day               Bathroom Shower/Tub: Tub/shower unit         Home Equipment: Environmental consultant - 4 wheels;Bedside commode;Hand held shower head   Additional Comments: lives with daughter who is retired Therapist, sports,      Prior Functioning/Environment Level of Independence: Needs Product/process development scientist / Transfers Assistance Needed: walks with rollator, can do dishes/make bed ADL's / Homemaking Assistance Needed: daughter assists with shower transfer, sits on 3 in 1 in shower             OT Problem List: Decreased strength;Decreased range of motion;Decreased activity tolerance;Impaired balance (sitting and/or standing);Decreased knowledge of use of DME or AE;Pain      OT Treatment/Interventions: Self-care/ADL training;Therapeutic exercise;DME and/or AE instruction;Therapeutic activities;Balance training;Patient/family education    OT Goals(Current goals can be found in the care plan section) Acute Rehab OT Goals Patient Stated Goal: pt is agreeable to rehab OT Goal Formulation: With patient Time For Goal Achievement: 04/07/21 Potential to Achieve Goals: Good  OT Frequency: Min 2X/week   Barriers to D/C:            Co-evaluation PT/OT/SLP Co-Evaluation/Treatment: Yes Reason for Co-Treatment: For patient/therapist safety;To address functional/ADL transfers (co-eval) PT goals addressed during session: Mobility/safety with mobility;Balance;Proper use of DME;Strengthening/ROM        AM-PAC OT "6 Clicks" Daily Activity     Outcome Measure Help from another person eating meals?: None Help from another person taking care of personal grooming?: A Little Help from another person toileting, which includes using toliet, bedpan, or urinal?: A Lot Help from another person bathing (including washing, rinsing, drying)?: A Lot Help from another person to put on and taking off regular upper body clothing?: A Little Help from another person to put on and taking off regular lower body clothing?: A Lot 6 Click Score: 16   End of Session Equipment Utilized During Treatment: Rolling walker;Gait belt Nurse Communication: Mobility status  Activity Tolerance: Patient limited by  pain Patient left: in chair;with call bell/phone within reach;with chair alarm set  OT Visit Diagnosis: Unsteadiness on feet (R26.81);Other abnormalities of gait and mobility (R26.89);History of falling (Z91.81);Pain                Time: 1140-1206 OT Time Calculation (min): 26 min Charges:  OT General  Charges $OT Visit: 1 Visit OT Evaluation $OT Eval Moderate Complexity: 1 Mod  Erikka Follmer, OTR/L [place]  Office [phone] Pager: [place] 03/24/2021, 2:25 PM

## 2021-03-25 DIAGNOSIS — Z79890 Hormone replacement therapy (postmenopausal): Secondary | ICD-10-CM | POA: Diagnosis not present

## 2021-03-25 DIAGNOSIS — H53461 Homonymous bilateral field defects, right side: Secondary | ICD-10-CM | POA: Diagnosis present

## 2021-03-25 DIAGNOSIS — R29818 Other symptoms and signs involving the nervous system: Secondary | ICD-10-CM | POA: Diagnosis not present

## 2021-03-25 DIAGNOSIS — I639 Cerebral infarction, unspecified: Secondary | ICD-10-CM | POA: Diagnosis not present

## 2021-03-25 DIAGNOSIS — Z823 Family history of stroke: Secondary | ICD-10-CM | POA: Diagnosis not present

## 2021-03-25 DIAGNOSIS — S72001D Fracture of unspecified part of neck of right femur, subsequent encounter for closed fracture with routine healing: Secondary | ICD-10-CM | POA: Diagnosis not present

## 2021-03-25 DIAGNOSIS — Z9181 History of falling: Secondary | ICD-10-CM | POA: Diagnosis not present

## 2021-03-25 DIAGNOSIS — M9701XA Periprosthetic fracture around internal prosthetic right hip joint, initial encounter: Secondary | ICD-10-CM | POA: Diagnosis not present

## 2021-03-25 DIAGNOSIS — R051 Acute cough: Secondary | ICD-10-CM | POA: Diagnosis not present

## 2021-03-25 DIAGNOSIS — I10 Essential (primary) hypertension: Secondary | ICD-10-CM | POA: Diagnosis not present

## 2021-03-25 DIAGNOSIS — E039 Hypothyroidism, unspecified: Secondary | ICD-10-CM | POA: Diagnosis not present

## 2021-03-25 DIAGNOSIS — Z885 Allergy status to narcotic agent status: Secondary | ICD-10-CM | POA: Diagnosis not present

## 2021-03-25 DIAGNOSIS — D649 Anemia, unspecified: Secondary | ICD-10-CM | POA: Diagnosis not present

## 2021-03-25 DIAGNOSIS — I351 Nonrheumatic aortic (valve) insufficiency: Secondary | ICD-10-CM | POA: Diagnosis present

## 2021-03-25 DIAGNOSIS — G43109 Migraine with aura, not intractable, without status migrainosus: Secondary | ICD-10-CM | POA: Diagnosis present

## 2021-03-25 DIAGNOSIS — R4701 Aphasia: Secondary | ICD-10-CM | POA: Diagnosis not present

## 2021-03-25 DIAGNOSIS — H5340 Unspecified visual field defects: Secondary | ICD-10-CM | POA: Diagnosis present

## 2021-03-25 DIAGNOSIS — E871 Hypo-osmolality and hyponatremia: Secondary | ICD-10-CM | POA: Diagnosis not present

## 2021-03-25 DIAGNOSIS — G459 Transient cerebral ischemic attack, unspecified: Secondary | ICD-10-CM | POA: Diagnosis present

## 2021-03-25 DIAGNOSIS — Z9109 Other allergy status, other than to drugs and biological substances: Secondary | ICD-10-CM | POA: Diagnosis not present

## 2021-03-25 DIAGNOSIS — R29810 Facial weakness: Secondary | ICD-10-CM | POA: Diagnosis not present

## 2021-03-25 DIAGNOSIS — E876 Hypokalemia: Secondary | ICD-10-CM | POA: Diagnosis not present

## 2021-03-25 DIAGNOSIS — M169 Osteoarthritis of hip, unspecified: Secondary | ICD-10-CM | POA: Diagnosis not present

## 2021-03-25 DIAGNOSIS — R29712 NIHSS score 12: Secondary | ICD-10-CM | POA: Diagnosis present

## 2021-03-25 DIAGNOSIS — G7000 Myasthenia gravis without (acute) exacerbation: Secondary | ICD-10-CM | POA: Diagnosis not present

## 2021-03-25 DIAGNOSIS — I6389 Other cerebral infarction: Secondary | ICD-10-CM | POA: Diagnosis not present

## 2021-03-25 DIAGNOSIS — Z881 Allergy status to other antibiotic agents status: Secondary | ICD-10-CM | POA: Diagnosis not present

## 2021-03-25 DIAGNOSIS — S72001A Fracture of unspecified part of neck of right femur, initial encounter for closed fracture: Secondary | ICD-10-CM

## 2021-03-25 DIAGNOSIS — E569 Vitamin deficiency, unspecified: Secondary | ICD-10-CM | POA: Diagnosis not present

## 2021-03-25 DIAGNOSIS — J309 Allergic rhinitis, unspecified: Secondary | ICD-10-CM | POA: Diagnosis not present

## 2021-03-25 DIAGNOSIS — Z20822 Contact with and (suspected) exposure to covid-19: Secondary | ICD-10-CM | POA: Diagnosis present

## 2021-03-25 DIAGNOSIS — R4182 Altered mental status, unspecified: Secondary | ICD-10-CM | POA: Diagnosis not present

## 2021-03-25 DIAGNOSIS — Z743 Need for continuous supervision: Secondary | ICD-10-CM | POA: Diagnosis not present

## 2021-03-25 DIAGNOSIS — Z91041 Radiographic dye allergy status: Secondary | ICD-10-CM | POA: Diagnosis not present

## 2021-03-25 DIAGNOSIS — E739 Lactose intolerance, unspecified: Secondary | ICD-10-CM | POA: Diagnosis not present

## 2021-03-25 DIAGNOSIS — Z9049 Acquired absence of other specified parts of digestive tract: Secondary | ICD-10-CM | POA: Diagnosis not present

## 2021-03-25 DIAGNOSIS — Z8249 Family history of ischemic heart disease and other diseases of the circulatory system: Secondary | ICD-10-CM | POA: Diagnosis not present

## 2021-03-25 DIAGNOSIS — R5381 Other malaise: Secondary | ICD-10-CM | POA: Diagnosis not present

## 2021-03-25 DIAGNOSIS — M2550 Pain in unspecified joint: Secondary | ICD-10-CM | POA: Diagnosis not present

## 2021-03-25 DIAGNOSIS — I63233 Cerebral infarction due to unspecified occlusion or stenosis of bilateral carotid arteries: Secondary | ICD-10-CM | POA: Diagnosis not present

## 2021-03-25 DIAGNOSIS — Z96641 Presence of right artificial hip joint: Secondary | ICD-10-CM | POA: Diagnosis present

## 2021-03-25 DIAGNOSIS — G4489 Other headache syndrome: Secondary | ICD-10-CM | POA: Diagnosis not present

## 2021-03-25 DIAGNOSIS — R052 Subacute cough: Secondary | ICD-10-CM | POA: Diagnosis not present

## 2021-03-25 DIAGNOSIS — R000 Tachycardia, unspecified: Secondary | ICD-10-CM | POA: Diagnosis not present

## 2021-03-25 DIAGNOSIS — E785 Hyperlipidemia, unspecified: Secondary | ICD-10-CM | POA: Diagnosis not present

## 2021-03-25 DIAGNOSIS — F419 Anxiety disorder, unspecified: Secondary | ICD-10-CM | POA: Diagnosis not present

## 2021-03-25 DIAGNOSIS — Z79899 Other long term (current) drug therapy: Secondary | ICD-10-CM | POA: Diagnosis not present

## 2021-03-25 DIAGNOSIS — R531 Weakness: Secondary | ICD-10-CM | POA: Diagnosis not present

## 2021-03-25 DIAGNOSIS — J45909 Unspecified asthma, uncomplicated: Secondary | ICD-10-CM | POA: Diagnosis not present

## 2021-03-25 DIAGNOSIS — R404 Transient alteration of awareness: Secondary | ICD-10-CM | POA: Diagnosis not present

## 2021-03-25 LAB — BASIC METABOLIC PANEL
Anion gap: 5 (ref 5–15)
BUN: 7 mg/dL — ABNORMAL LOW (ref 8–23)
CO2: 26 mmol/L (ref 22–32)
Calcium: 9.3 mg/dL (ref 8.9–10.3)
Chloride: 105 mmol/L (ref 98–111)
Creatinine, Ser: 0.57 mg/dL (ref 0.44–1.00)
GFR, Estimated: >60 mL/min (ref >60)
Glucose, Bld: 126 mg/dL — ABNORMAL HIGH (ref 70–99)
Potassium: 3.7 mmol/L (ref 3.5–5.1)
Sodium: 136 mmol/L (ref 135–145)

## 2021-03-25 LAB — MAGNESIUM: Magnesium: 1.9 mg/dL (ref 1.7–2.4)

## 2021-03-25 LAB — SARS CORONAVIRUS 2 (TAT 6-24 HRS): SARS Coronavirus 2: NEGATIVE

## 2021-03-25 MED ORDER — ALPRAZOLAM 0.5 MG PO TABS: 0.5000 mg | ORAL_TABLET | Freq: Four times a day (QID) | ORAL | 0 refills | Status: DC

## 2021-03-25 MED ORDER — VALSARTAN 160 MG PO TABS: 160.0000 mg | ORAL_TABLET | Freq: Every day | ORAL | 0 refills | Status: DC

## 2021-03-25 MED ORDER — SIMETHICONE 80 MG PO CHEW: 160.0000 mg | CHEWABLE_TABLET | Freq: Four times a day (QID) | ORAL | 0 refills | Status: DC | PRN

## 2021-03-25 MED ORDER — OXYCODONE-ACETAMINOPHEN 5-325 MG PO TABS: 1.0000 | ORAL_TABLET | Freq: Four times a day (QID) | ORAL | 0 refills | Status: DC | PRN

## 2021-03-25 MED ORDER — SIMETHICONE 80 MG PO CHEW
160.0000 mg | CHEWABLE_TABLET | Freq: Four times a day (QID) | ORAL | Status: DC
  Administered 2021-03-25: 160 mg via ORAL
  Filled 2021-03-25: qty 2

## 2021-03-25 MED ORDER — ENOXAPARIN SODIUM 40 MG/0.4ML IJ SOSY: 40.0000 mg | PREFILLED_SYRINGE | INTRAMUSCULAR | Status: DC

## 2021-03-25 NOTE — Progress Notes (Signed)
Patient discharged to [facility] via [facility]. All belongings w/ family including home meds from pharmacy.

## 2021-03-25 NOTE — Progress Notes (Signed)
Physical Therapy Treatment Patient Details Name: Sabrina [name] MRN: [id number] DOB: 1940-10-04 Today's Date: 03/25/2021    History of Present Illness 80 y.o. female  Presenting with right hip pain after fall. Imaging showed a periprosthetic fracture, plan is non operative, conservative management, NWB RLE. Pt with medical history significant of arthritis, anxiety, hypothyroidism, R THA, myasthenia gravis    PT Comments    Pt assisted with transfer to recliner.  Pt requiring assist and cues to maintain NWB, so not yet ready to attempt ambulation.  Pt pleased to bed OOB end of session.  Pt anticipates d/c to SNF hopefully today.    Follow Up Recommendations  SNF;Supervision/Assistance - 24 hour     Equipment Recommendations  None recommended by PT    Recommendations for Other Services       Precautions / Restrictions Precautions Precautions: Fall Restrictions Weight Bearing Restrictions: Yes RLE Weight Bearing: Non weight bearing    Mobility  Bed Mobility Overal bed mobility: Needs Assistance Bed Mobility: Supine to Sit     Supine to sit: Supervision;HOB elevated     General bed mobility comments: pt elevated HOB to self assisted and used bed rail    Transfers Overall transfer level: Needs assistance Equipment used: Rolling walker (2 wheeled) Transfers: Sit to/from [facility] Sit to Stand: Mod assist Stand pivot transfers: Mod assist       General transfer comment: verbal cues for UE and LE positioning, cues for maintaining NWB, assist for weakness and stability  Ambulation/Gait                 Stairs             Wheelchair Mobility    Modified Rankin (Stroke Patients Only)       Balance Overall balance assessment: Needs assistance         Standing balance support: Bilateral upper extremity supported Standing balance-Leahy Scale: Zero Standing balance comment: requires +2 assist in standing                             Cognition Arousal/Alertness: Awake/alert Behavior During Therapy: WFL for tasks assessed/performed Overall Cognitive Status: Within Functional Limits for tasks assessed                                        Exercises General Exercises - Lower Extremity Ankle Circles/Pumps: AROM;Both;10 reps;Seated Long Arc Quad: AROM;Right;10 reps;Seated Hip ABduction/ADduction: AAROM;Right;5 reps;Supine    General Comments        Pertinent Vitals/Pain Pain Assessment: Faces Pain Score: 10-Worst pain ever Pain Location: R hip with activity Pain Descriptors / Indicators: Grimacing;Guarding;Discomfort;Sore Pain Intervention(s): Monitored during session;Repositioned;Limited activity within patient's tolerance    Home Living                      Prior Function            PT Goals (current goals can now be found in the care plan section) Progress towards PT goals: Progressing toward goals    Frequency    Min 3X/week      PT Plan Current plan remains appropriate    Co-evaluation              AM-PAC PT "6 Clicks" Mobility   Outcome Measure  Help needed turning from your back to your  side while in a flat bed without using bedrails?: A Little Help needed moving from lying on your back to sitting on the side of a flat bed without using bedrails?: A Lot Help needed moving to and from a bed to a chair (including a wheelchair)?: A Lot Help needed standing up from a chair using your arms (e.g., wheelchair or bedside chair)?: A Lot Help needed to walk in hospital room?: Total Help needed climbing 3-5 steps with a railing? : Total 6 Click Score: 11    End of Session Equipment Utilized During Treatment: Gait belt Activity Tolerance: Patient tolerated treatment well Patient left: in chair;with call bell/phone within reach;with chair alarm set;with nursing/sitter in room Nurse Communication: Mobility status PT Visit Diagnosis: Difficulty in  walking, not elsewhere classified (R26.2);Pain;History of falling (Z91.81);Muscle weakness (generalized) (M62.81) Pain - Right/Left: Right Pain - part of body: Hip     Time: [id number] PT Time Calculation (min) (ACUTE ONLY): 15 min  Charges:  $Therapeutic Activity: 8-22 mins                    Jannette Spanner PT, DPT Acute Rehabilitation Services Pager: [phone] Office: [phone]    Trena Platt 03/25/2021, 12:43 PM

## 2021-03-25 NOTE — TOC Transition Note (Signed)
Transition of Care [facility]) - CM/SW Discharge Note   Patient Details  Name: AMII AMBROSI MRN: [id number] Date of Birth: 03-11-1941  Transition of Care [facility]) CM/SW Contact:  Lennart Pall, LCSW Phone Number: 03/25/2021, 2:34 PM   Clinical Narrative:    Pt medically cleared for SNF dc today and bed accepted at [facility].  PTAR called and RN to call report to [phone].  No further TOC needs.   Final next level of care: Skilled Nursing Facility Barriers to Discharge: Barriers Resolved   Patient Goals and CMS Choice Patient states their goals for this hospitalization and ongoing recovery are:: eventually return home following SNF rehab CMS Medicare.gov Compare Post Acute Care list provided to:: Patient Choice offered to / list presented to : Patient, Adult Children  Discharge Placement PASRR number recieved: 03/24/21            Patient chooses bed at: [facility] Patient to be transferred to facility by: [place] Name of family member notified: granddtr-in-law and grandson Patient and family notified of of transfer: 03/25/21  Discharge Plan and Services In-house Referral: Clinical Social Work   Post Acute Care Choice: [place]          DME Arranged: N/A DME Agency: NA                  Social Determinants of Health (SDOH) Interventions     Readmission Risk Interventions No flowsheet data found.

## 2021-03-25 NOTE — Discharge Summary (Signed)
Physician Discharge Summary  Sabrina [name] [id number] DOB: Dec 17, 1940 DOA: 03/23/2021  PCP: Ginger Organ., MD  Admit date: 03/23/2021 Discharge date: 03/25/2021  Time spent: 55 minutes  Recommendations for Outpatient Follow-up:  Follow-up with MD at skilled nursing facility.  Patient will need a basic metabolic profile done in 1 week to follow-up on electrolytes and renal function.  Patient's blood pressure need to be followed up upon. Follow-up with Dr. Ninfa Linden, orthopedics in 2 weeks.   Discharge Diagnoses:  Principal Problem:   Hip fracture ([place]) Active Problems:   Essential hypertension   Myasthenia gravis, 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HCC)   Hyponatremia   Hypokalemia   Anxiety   Fracture, proximal femur, right, closed, initial encounter [facility])   Discharge Condition: Stable and improved  Diet recommendation: Regular  Filed Weights   03/23/21 0953 03/23/21 1636  Weight: 81.6 kg 81.6 kg    History of present illness:  HPI per Dr. Verita Schneiders is a 80 y.o. female with medical history significant of arthritis, anxiety, hypothyroidism. Presented with right hip pain after fall. She reported that she was trying to move a tray table yesterday afternoon. While using her walker, she got tripped up and fell onto her right hip. She had a sharp pain and was unable to get up on her own. The neighbor came by and help her up. She felt as if she were fine enough to stay home. She decided to go rest and her daughter brought her a bedside commode. During the night she was trying to get to the [facility] when she slipped and landed on her right hip again. This time she hit her right head, but did not suffer LOC. She also scrapped her arm. She decided to come to the ED this morning when her hip was still in pain. She denies any other aggravating or alleviating factors.    ED Course: XR showed right hip fx. Ortho was consulted. [facility] was called for admission.    Hospital Course:  1 minimally  displaced right periprosthetic hip fracture -Secondary to mechanical fall. -Patient seen in consultation by orthopedics who discussed with patient who would prefer nonoperative treatment at this time. -Per orthopedics reasonable for nonoperative treatment at this time with nonweightbearing to the right hip for likely 4 to 6 weeks to allow for adequate healing. -Patient seen by PT/OT who recommended SNF placement. -Patient was discharged to skilled nursing facility. -Outpatient follow-up with orthopedics, Dr. Ninfa Linden.  2.  Hypertension -Patient's valsartan HCTZ was held during the hospitalization and patient maintained on Avapro.   -HCTZ discontinued on discharge secondary to hyponatremia.  -Patient be discharged on valsartan 160 mg daily.   -Outpatient follow-up with PCP.   3.  Hypokalemia -Repleted.  4.  Hyponatremia -Patient's HCTZ was held and discontinued during the hospitalization.   -Hyponatremia had improved and had resolved by day of discharge.  5.  Hyperlipidemia -Patient maintained on the statin.  6.  Myasthenia gravis -Remained stable on home regimen CellCept.  7.  Asthma -Stable. -Patient maintained on home regimen of Breo Ellipta and Singulair.  8.  Anxiety -Patient maintained on home regimen of Prozac, Xanax.    Procedures: CT head CT C-spine 03/23/2021 Plain films of the pelvis and right hip 03/23/2021    Consultations: Orthopedics: Dr. Ninfa Linden  Discharge Exam: Vitals:   03/24/21 2122 03/25/21 0523  BP: (!) 160/58 (!) 159/64  Pulse: 65 67  Resp: 16 17  Temp: 98.7 F (37.1 C) 97.9 F (  36.6 C)  SpO2: 94% 96%    General: NAD Cardiovascular: RRR no murmurs rubs or gallops.  No JVD.  No lower extremity edema. Respiratory: CTA B.  Discharge Instructions   Discharge Instructions     Diet general   Complete by: As directed    Discharge wound care:   Complete by: As directed    As above   Discharge wound care:   Complete by: As directed     As above   Increase activity slowly   Complete by: As directed    Increase activity slowly   Complete by: As directed    Nonweightbearing right lower extremity      Allergies as of 03/25/2021       Reactions   Azathioprine Anaphylaxis, Other (See Comments)   Kidneys shut down   Other Anaphylaxis   Sodium pentathol Uncoded Allergy. Allergen: Sodium pentathol   Aminoglycosides    Tobramycin, gentamycin, kanamycin, neomycin, streptomycin Can't take because of myasthenia gravis   Avelox [moxifloxacin Hcl In Nacl] Other (See Comments)   Can't take because of myasthenia gravis   Beta Adrenergic Blockers    Proponolol, timolol maleate eyedrops Can't take because of myasthenia gravis   Calcium Channel Blockers    Blood pressure medications Can't take because of myasthenia gravis   Cephalosporins    Can't take because of myasthenia gravis   Ciprofloxacin Other (See Comments)   Can't take because of myasthenia gravis Can't take because of myasthenia gravis   Clindamycin/lincomycin    May exascerbate myasthenia gravis   Colistin    Can't take because of myasthenia gravis   Fosamax [alendronate Sodium]    LEG WEAKNESS   Imuran [azathioprine Sodium] Other (See Comments)   Kidneys shut down   Iodinated Diagnostic Agents Other (See Comments)   Contraindicated to Myasthenia Gravis   Iodine    Iodine contrast Can't take because of myasthenia gravis   Macrolides And Ketolides    Erythromocin, azithromycin, telithromycin Can't take because of myasthenia gravis   Magnesium-containing Compounds    Including milk of magnesia, antacids containing magnesium hydroxide (maalox, mylanta) and epsom salts Can't take because of myasthenia gravis   Moxifloxacin Other (See Comments)   Can't take because of myasthenia gravis   Norfloxacin Other (See Comments)   Can't take because of myasthenia gravis Can't take because of myasthenia gravis   Ofloxacin Other (See Comments)   Can't take because  of myasthenia gravis Can't take because of myasthenia gravis   Pefloxacin Other (See Comments)   Can't take because of myasthenia gravis Can't take because of myasthenia gravis   Penicillamine    do not use due to Myasthenia Gravis   Prednisolone Nausea And Vomiting   Prednisone Nausea And Vomiting   Procainamide    Can't take because of myasthenia gravis   Quinidine    Can't take because of myasthenia gravis   Quinine Derivatives    Can't take because of myasthenia gravis   Succinylcholine    Can't take because of myasthenia gravis No paralytics   Tubocurarine    Can't take because of myasthenia gravis   Vecuronium    Can't take because of myasthenia gravis No paralytics   Vicodin [hydrocodone-acetaminophen] Other (See Comments)   Pt suffers from bad headaches        Medication List     STOP taking these medications    valsartan-hydrochlorothiazide 160-12.5 MG tablet Commonly known as: DIOVAN-HCT       TAKE these medications  ALPRAZolam 0.5 MG tablet Commonly known as: XANAX Take 1 tablet (0.5 mg total) by mouth every 6 (six) hours.   amoxicillin 500 MG capsule Commonly known as: AMOXIL Take 2,000 mg by mouth daily as needed (before dental procedures).   Centrum Silver tablet Take by mouth.   diclofenac Sodium 1 % Gel Commonly known as: VOLTAREN Apply 2 g topically 4 (four) times daily as needed (pain).   enoxaparin 40 MG/0.4ML injection Commonly known as: LOVENOX Inject 0.4 mLs (40 mg total) into the skin daily.   FLUoxetine 20 MG tablet Commonly known as: PROZAC Take 20 mg by mouth every morning.   fluticasone 50 MCG/ACT nasal spray Commonly known as: FLONASE Place 2 sprays into both nostrils daily.   Ibuprofen-diphenhydrAMINE Cit 200-38 MG Tabs Take 1 tablet by mouth at bedtime.   LACTAID PO Take 1 tablet by mouth as needed (before dairy).   levothyroxine 75 MCG tablet Commonly known as: SYNTHROID Take 75 mcg by mouth every morning.  MUST TAKE BRAND NAME SYNTHROID   montelukast 10 MG tablet Commonly known as: SINGULAIR Take 10 mg by mouth at bedtime. MUST BE BRAND NAME ONLY-SINGULAIR   mycophenolate 500 MG tablet Commonly known as: CELLCEPT Take 1,000 mg by mouth 2 (two) times daily.   Omega 3 1200 MG Caps Take 1,200 mg by mouth daily.   oxyCODONE-acetaminophen 5-325 MG tablet Commonly known as: PERCOCET/ROXICET Take 1 tablet by mouth 4 (four) times daily as needed for severe pain.   simethicone 80 MG chewable tablet Commonly known as: MYLICON Chew 2 tablets (160 mg total) by mouth 4 (four) times daily as needed for flatulence.   traZODone 150 MG tablet Commonly known as: DESYREL Take 75 mg by mouth at bedtime.   Trelegy Ellipta 200-62.5-25 MCG/INH Aepb Generic drug: Fluticasone-Umeclidin-Vilant Inhale 1 puff into the lungs daily.   valsartan 160 MG tablet Commonly known as: Diovan Take 1 tablet (160 mg total) by mouth daily.   vitamin C 1000 MG tablet Take 1,000 mg by mouth every morning.   VITAMIN D-3 PO Take 1,000 mg by mouth every morning.               Discharge Care Instructions  (From admission, onward)           Start     Ordered   03/25/21 0000  Discharge wound care:       Comments: As above   03/25/21 1253   03/25/21 0000  Discharge wound care:       Comments: As above   03/25/21 1301           Allergies  Allergen Reactions   Azathioprine Anaphylaxis and Other (See Comments)    Kidneys shut down   Other Anaphylaxis    Sodium pentathol Uncoded Allergy. Allergen: Sodium pentathol   Aminoglycosides     Tobramycin, gentamycin, kanamycin, neomycin, streptomycin Can't take because of myasthenia gravis   Avelox [Moxifloxacin Hcl In Nacl] Other (See Comments)    Can't take because of myasthenia gravis   Beta Adrenergic Blockers     Proponolol, timolol maleate eyedrops Can't take because of myasthenia gravis   Calcium Channel Blockers     Blood pressure  medications Can't take because of myasthenia gravis   Cephalosporins     Can't take because of myasthenia gravis   Ciprofloxacin Other (See Comments)    Can't take because of myasthenia gravis Can't take because of myasthenia gravis   Clindamycin/Lincomycin     May exascerbate myasthenia  gravis   Colistin     Can't take because of myasthenia gravis   Fosamax [Alendronate Sodium]     LEG WEAKNESS   Imuran [Azathioprine Sodium] Other (See Comments)    Kidneys shut down   Iodinated Diagnostic Agents Other (See Comments)    Contraindicated to Myasthenia Gravis   Iodine     Iodine contrast Can't take because of myasthenia gravis   Macrolides And Ketolides     Erythromocin, azithromycin, telithromycin Can't take because of myasthenia gravis   Magnesium-Containing Compounds     Including milk of magnesia, antacids containing magnesium hydroxide (maalox, mylanta) and epsom salts Can't take because of myasthenia gravis   Moxifloxacin Other (See Comments)    Can't take because of myasthenia gravis   Norfloxacin Other (See Comments)    Can't take because of myasthenia gravis Can't take because of myasthenia gravis   Ofloxacin Other (See Comments)    Can't take because of myasthenia gravis Can't take because of myasthenia gravis   Pefloxacin Other (See Comments)    Can't take because of myasthenia gravis Can't take because of myasthenia gravis   Penicillamine     do not use due to Myasthenia Gravis     Prednisolone Nausea And Vomiting   Prednisone Nausea And Vomiting   Procainamide     Can't take because of myasthenia gravis   Quinidine     Can't take because of myasthenia gravis   Quinine Derivatives     Can't take because of myasthenia gravis   Succinylcholine     Can't take because of myasthenia gravis No paralytics   Tubocurarine     Can't take because of myasthenia gravis   Vecuronium     Can't take because of myasthenia gravis No paralytics   Vicodin  [Hydrocodone-Acetaminophen] Other (See Comments)    Pt suffers from bad headaches    Contact information for follow-up providers     Mcarthur Rossetti, MD. Schedule an appointment as soon as possible for a visit in 2 week(s).   Specialty: Orthopedic Surgery Contact information: [place] [address] [phone]         MD AT SNF Follow up.               Contact information for after-discharge care     Destination     [facility] Preferred SNF .   Service: Skilled Nursing Contact information: [address] [place] [phone]                      The results of significant diagnostics from this hospitalization (including imaging, microbiology, ancillary and laboratory) are listed below for reference.    Significant Diagnostic Studies: CT Head Wo Contrast  Result Date: 03/23/2021 CLINICAL DATA:  Poly trauma.  Fall. EXAM: CT HEAD WITHOUT CONTRAST CT CERVICAL SPINE WITHOUT CONTRAST TECHNIQUE: Multidetector CT imaging of the head and cervical spine was performed following the standard protocol without intravenous contrast. Multiplanar CT image reconstructions of the cervical spine were also generated. COMPARISON:  CT of the neck from 08/11/2010. FINDINGS: CT HEAD FINDINGS Brain: No evidence of acute large vascular territory infarction, hemorrhage, hydrocephalus, extra-axial collection or mass lesion/mass effect. Mild patchy white matter hypoattenuation, likely related to mild chronic microvascular ischemic disease. 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 Surrounding streak artifact limits evaluation. Intracranial atherosclerosis. Skull: No acute fracture.  Hyperostosis frontalis. Sinuses/Orbits: Partially imaged left  maxillary sinus opacification with air-fluid level. Other: No mastoid effusions. CT CERVICAL SPINE FINDINGS Alignment: Mild  anterolisthesis of T1 on T2 and T2 on T3, likely degenerative given similar findings on prior CT neck from 2011. Otherwise, no substantial sagittal subluxation. There is reversal of the normal cervical lordosis. Levocurvature of the lower cervical spine, centered at the cervicothoracic junction. Skull base and vertebrae: No evidence of acute fracture. Vertebral body heights are maintained. Diffuse osteopenia. Soft tissues and spinal canal: No prevertebral fluid or swelling. No visible canal hematoma. Disc levels: Severe multilevel degenerative disease in the lower cervical spine with severe disc height loss, endplate sclerosis and posterior disc osteophyte complexes. Bulky multilevel left greater than right facet arthropathy with likely at least moderate foraminal stenosis bilaterally at C6-C7 and on the right at C7-T1. Upper chest: Visualized lung apices are clear. Other: Calcific atherosclerosis of the aorta. IMPRESSION: CT head: 1. No evidence of acute intracranial abnormality. 2. 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 3. Left maxillary sinus opacification with air-fluid level, partially imaged. CT cervical spine: 1. No evidence of acute fracture or traumatic malalignment. 2. 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 3. Bulky multilevel left greater than right facet and uncovertebral hypertrophy with likely at least moderate foraminal stenosis bilaterally at C6-C7 and on the right at C7-T1. An MRI could further characterize the canal and foramina if clinically indicated. Electronically Signed   By: Margaretha Sheffield MD   On: 03/23/2021 11:54   CT Cervical Spine Wo Contrast  Result Date: 03/23/2021 CLINICAL DATA:  Poly trauma.  Fall. EXAM: CT HEAD WITHOUT CONTRAST CT CERVICAL SPINE WITHOUT CONTRAST TECHNIQUE: Multidetector CT imaging of the head and cervical spine was performed following the standard protocol  without intravenous contrast. Multiplanar CT image reconstructions of the cervical spine were also generated. COMPARISON:  CT of the neck from 08/11/2010. FINDINGS: CT HEAD FINDINGS Brain: No evidence of acute large vascular territory infarction, hemorrhage, hydrocephalus, extra-axial collection or mass lesion/mass effect. Mild patchy white matter hypoattenuation, likely related to mild chronic microvascular ischemic disease. 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 Surrounding streak artifact limits evaluation. Intracranial atherosclerosis. Skull: No acute fracture.  Hyperostosis frontalis. Sinuses/Orbits: Partially imaged left maxillary sinus opacification with air-fluid level. Other: No mastoid effusions. CT CERVICAL SPINE FINDINGS Alignment: Mild anterolisthesis of T1 on T2 and T2 on T3, likely degenerative given similar findings on prior CT neck from 2011. Otherwise, no substantial sagittal subluxation. There is reversal of the normal cervical lordosis. Levocurvature of the lower cervical spine, centered at the cervicothoracic junction. Skull base and vertebrae: No evidence of acute fracture. Vertebral body heights are maintained. Diffuse osteopenia. Soft tissues and spinal canal: No prevertebral fluid or swelling. No visible canal hematoma. Disc levels: Severe multilevel degenerative disease in the lower cervical spine with severe disc height loss, endplate sclerosis and posterior disc osteophyte complexes. Bulky multilevel left greater than right facet arthropathy with likely at least moderate foraminal stenosis bilaterally at C6-C7 and on the right at C7-T1. Upper chest: Visualized lung apices are clear. Other: Calcific atherosclerosis of the aorta. IMPRESSION: CT head: 1. No evidence of acute intracranial abnormality. 2. 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 3. Left  maxillary sinus opacification with air-fluid level, partially imaged. CT cervical spine: 1. No evidence of acute fracture or traumatic malalignment. 2.  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 3. Bulky multilevel left greater than right facet and uncovertebral hypertrophy with likely at least moderate foraminal stenosis bilaterally at C6-C7 and on the right at C7-T1. An MRI could further characterize the canal and foramina if clinically indicated. Electronically Signed   By: Margaretha Sheffield MD   On: 03/23/2021 11:54   DG Hip Unilat W or Wo Pelvis 2-3 Views Right  Result Date: 03/23/2021 CLINICAL DATA:  Right hip pain after fall. EXAM: DG HIP (WITH OR WITHOUT PELVIS) 2-3V RIGHT COMPARISON:  None. FINDINGS: Status post right total hip arthroplasty. The acetabular and femoral components are well situated. There is noted cortical irregularity involving the subtrochanteric region concerning for minimally displaced periprosthetic fracture. IMPRESSION: Status post right total hip arthroplasty. Probable minimally displaced periprosthetic fracture is seen involving the proximal right femur. Electronically Signed   By: Marijo Conception M.D.   On: 03/23/2021 11:56    Microbiology: Recent Results (from the past 240 hour(s))  Resp Panel by RT-PCR (Flu A&B, Covid) Nasopharyngeal Swab     Status: None   Collection Time: 03/23/21  1:21 PM   Specimen: Nasopharyngeal Swab; Nasopharyngeal(NP) swabs in vial transport medium  Result Value Ref Range Status   SARS Coronavirus 2 by RT PCR NEGATIVE NEGATIVE Final    Comment: (NOTE) SARS-CoV-2 target nucleic acids are NOT DETECTED.  The SARS-CoV-2 RNA is generally detectable in upper respiratory specimens during the acute phase of infection. The lowest concentration of SARS-CoV-2 viral copies this assay can detect is 138 copies/mL. A negative result does not preclude SARS-Cov-2 infection and should not be used as the sole basis for  treatment or other patient management decisions. A negative result may occur with  improper specimen collection/handling, submission of specimen other than nasopharyngeal swab, presence of viral mutation(s) within the areas targeted by this assay, and inadequate number of viral copies(<138 copies/mL). A negative result must be combined with clinical observations, patient history, and epidemiological information. The expected result is Negative.  Fact Sheet for Patients:  EntrepreneurPulse.com.au  Fact Sheet for Healthcare Providers:  IncredibleEmployment.be  This test is no t yet approved or cleared by the [place] FDA and  has been authorized for detection and/or diagnosis of SARS-CoV-2 by FDA under an Emergency Use Authorization (EUA). This EUA will remain  in effect (meaning this test can be used) for the duration of the COVID-19 declaration under Section 564(b)(1) of the Act, 21 U.S.C.section 360bbb-3(b)(1), unless the authorization is terminated  or revoked sooner.       Influenza A by PCR NEGATIVE NEGATIVE Final   Influenza B by PCR NEGATIVE NEGATIVE Final    Comment: (NOTE) The Xpert Xpress SARS-CoV-2/FLU/RSV plus assay is intended as an aid in the diagnosis of influenza from Nasopharyngeal swab specimens and should not be used as a sole basis for treatment. Nasal washings and aspirates are unacceptable for Xpert Xpress SARS-CoV-2/FLU/RSV testing.  Fact Sheet for Patients: EntrepreneurPulse.com.au  Fact Sheet for Healthcare Providers: IncredibleEmployment.be  This test is not yet approved or cleared by the [place] FDA and has been authorized for detection and/or diagnosis of SARS-CoV-2 by FDA under an Emergency Use Authorization (EUA). This EUA will remain in effect (meaning this test can be used) for the duration of the COVID-19 declaration under Section 564(b)(1) of the Act, 21  U.S.C. section 360bbb-3(b)(1), unless the authorization is terminated or revoked.  Performed at [facility], [place] [address], [place], [place] [address]  SARS CORONAVIRUS 2 (TAT 6-24 HRS) Nasopharyngeal Nasopharyngeal Swab     Status: None   Collection Time: 03/24/21  5:20 PM   Specimen: Nasopharyngeal Swab  Result Value Ref Range Status   SARS Coronavirus 2 NEGATIVE NEGATIVE Final    Comment: (NOTE) SARS-CoV-2 target nucleic acids are NOT DETECTED.  The SARS-CoV-2 RNA is generally detectable in upper and lower respiratory specimens during the acute phase of infection. Negative results do not preclude SARS-CoV-2 infection, do not rule out co-infections with other pathogens, and should not be used as the sole basis for treatment or other patient management decisions. Negative results must be combined with clinical observations, patient history, and epidemiological information. The expected result is Negative.  Fact Sheet for Patients: SugarRoll.be  Fact Sheet for Healthcare Providers: https://www.woods-mathews.com/  This test is not yet approved or cleared by the [place] FDA and  has been authorized for detection and/or diagnosis of SARS-CoV-2 by FDA under an Emergency Use Authorization (EUA). This EUA will remain  in effect (meaning this test can be used) for the duration of the COVID-19 declaration under Se ction 564(b)(1) of the Act, 21 U.S.C. section 360bbb-3(b)(1), unless the authorization is terminated or revoked sooner.  Performed at [facility], [place] [address], [place],  [address]      Labs: Basic Metabolic Panel: Recent Labs  Lab 03/23/21 1046 03/24/21 0254 03/25/21 0313  NA 134* 133* 136  K 3.3* 3.3* 3.7  CL 95* 99 105  CO2 '27 27 26  '$ GLUCOSE 133* 131* 126*  BUN 7* 7* 7*  CREATININE 0.54 0.79 0.57  CALCIUM 9.9 8.9 9.3  MG 1.9 1.8 1.9   Liver Function Tests: No results  for input(s): AST, ALT, ALKPHOS, BILITOT, PROT, ALBUMIN in the last 168 hours. No results for input(s): LIPASE, AMYLASE in the last 168 hours. No results for input(s): AMMONIA in the last 168 hours. CBC: Recent Labs  Lab 03/23/21 1046 03/24/21 0254  WBC 11.2* 7.4  HGB 13.2 11.6*  HCT 40.0 35.4*  MCV 85.7 87.8  PLT 326 287   Cardiac Enzymes: No results for input(s): CKTOTAL, CKMB, CKMBINDEX, TROPONINI in the last 168 hours. BNP: BNP (last 3 results) No results for input(s): BNP in the last 8760 hours.  ProBNP (last 3 results) No results for input(s): PROBNP in the last 8760 hours.  CBG: No results for input(s): GLUCAP in the last 168 hours.     Signed:  Irine Seal MD.  [facility] 03/25/2021, 1:01 PM

## 2021-03-28 DIAGNOSIS — G7000 Myasthenia gravis without (acute) exacerbation: Secondary | ICD-10-CM | POA: Diagnosis not present

## 2021-03-28 DIAGNOSIS — J45909 Unspecified asthma, uncomplicated: Secondary | ICD-10-CM | POA: Diagnosis not present

## 2021-03-28 DIAGNOSIS — R052 Subacute cough: Secondary | ICD-10-CM | POA: Diagnosis not present

## 2021-03-28 DIAGNOSIS — S72001D Fracture of unspecified part of neck of right femur, subsequent encounter for closed fracture with routine healing: Secondary | ICD-10-CM | POA: Diagnosis not present

## 2021-03-28 DIAGNOSIS — R051 Acute cough: Secondary | ICD-10-CM | POA: Diagnosis not present

## 2021-03-29 DIAGNOSIS — I10 Essential (primary) hypertension: Secondary | ICD-10-CM | POA: Diagnosis not present

## 2021-03-29 DIAGNOSIS — S72001D Fracture of unspecified part of neck of right femur, subsequent encounter for closed fracture with routine healing: Secondary | ICD-10-CM | POA: Diagnosis not present

## 2021-03-29 DIAGNOSIS — R531 Weakness: Secondary | ICD-10-CM | POA: Diagnosis not present

## 2021-03-29 DIAGNOSIS — Z9181 History of falling: Secondary | ICD-10-CM | POA: Diagnosis not present

## 2021-03-29 DIAGNOSIS — D649 Anemia, unspecified: Secondary | ICD-10-CM | POA: Diagnosis not present

## 2021-03-30 DIAGNOSIS — D649 Anemia, unspecified: Secondary | ICD-10-CM | POA: Diagnosis not present

## 2021-04-05 ENCOUNTER — Telehealth: Payer: Self-pay | Admitting: Physician Assistant

## 2021-04-05 NOTE — Telephone Encounter (Signed)
Pt's daughter Vickii Chafe called requesting to speak directly to Dr. Ninfa Linden or PA Carlis Abbott. I informed pt that dr. Ninfa Linden is out of the office I would ask for P Clark to call her back. She states pt is having medical issues with PT and other various issues that need to be cleared up. Please call [name] at [phone].

## 2021-04-05 NOTE — Telephone Encounter (Signed)
Please see message. °

## 2021-04-06 ENCOUNTER — Telehealth: Payer: Self-pay | Admitting: Orthopaedic Surgery

## 2021-04-06 NOTE — Telephone Encounter (Signed)
Called patient's daughter Vickii Chafe left message to return call to schedule an appointment for her mother for Friday or next week    [phone]

## 2021-04-06 NOTE — Telephone Encounter (Signed)
Can you please do this for me?

## 2021-04-07 ENCOUNTER — Encounter: Payer: Self-pay | Admitting: Physician Assistant

## 2021-04-07 ENCOUNTER — Ambulatory Visit (INDEPENDENT_AMBULATORY_CARE_PROVIDER_SITE_OTHER): Payer: Medicare Other

## 2021-04-07 ENCOUNTER — Other Ambulatory Visit: Payer: Self-pay

## 2021-04-07 ENCOUNTER — Ambulatory Visit (INDEPENDENT_AMBULATORY_CARE_PROVIDER_SITE_OTHER): Payer: Medicare Other | Admitting: Physician Assistant

## 2021-04-07 DIAGNOSIS — S72001A Fracture of unspecified part of neck of right femur, initial encounter for closed fracture: Secondary | ICD-10-CM

## 2021-04-07 NOTE — Progress Notes (Signed)
Office Visit Note   Patient: Sabrina [name]           Date of Birth: 1940-09-22           MRN: [id number] Visit Date: 04/07/2021              Requested by: Ginger Organ., MD [address] [place],  [place] [address] PCP: Ginger Organ., MD   Assessment & Plan: Visit Diagnoses:  1. Fracture, proximal femur, right, closed, initial encounter ([place])     Plan: She will remain nonweightbearing on the right leg.  She will work on gentle range of motion knee and ankle.  Went over radiographic findings with she and her daughter today.  Did recommend Geo mat for her wheelchair given the fact that she is having some buttocks pain.  We will see her back in 1 month at that time we will obtain 2 views of the right hip.  Questions were encouraged and answered  Follow-Up Instructions: Return in about 4 weeks (around 05/05/2021) for Radiographs.   Orders:  Orders Placed This Encounter  Procedures   XR HIP UNILAT W OR W/O PELVIS 2-3 VIEWS RIGHT   No orders of the defined types were placed in this encounter.     Procedures: No procedures performed   Clinical Data: No additional findings.   Subjective: Chief Complaint  Patient presents with   Right Hip - Pain    HPI Mrs. Sabrina [name] is well-known to Dr. Creta Levin service.  She unfortunately had a fall approximately 2 weeks ago and sustained a nondisplaced periprosthetic fracture right femur.  She has been nonweightbearing.  She had a skilled facility.  Review of Systems See HPI otherwise negative  Objective: Vital Signs: There were no vitals taken for this visit.  Physical Exam General well-developed well-nourished female no acute distress Psych: Alert and oriented x3 Ortho Exam Right lower extremity.  She has good range of motion of the knee calf supple nontender.  No extremes of internal and external rotation of the hip or performed today.  Dorsiflexion plantarflexion right ankle intact. Specialty Comments:  No specialty  comments available.  Imaging: XR HIP UNILAT W OR W/O PELVIS 2-3 VIEWS RIGHT  Result Date: 04/07/2021 2 views right hip: These are compared to films 03/23/2021.  These show no acute changes.  Periprosthetic fracture that is unchanged from prior films.  No significant consolidation.  Hip is well located.  No hardware failure.    PMFS History: Patient Active Problem List   Diagnosis Date Noted   Fracture, proximal femur, right, closed, initial encounter ([place])    Hyponatremia 03/24/2021   Hypokalemia 03/24/2021   Anxiety 03/24/2021   Hip fracture ([place]) 03/23/2021   Unilateral primary osteoarthritis, left knee 11/26/2017   Lumbar stenosis 06/18/2015   Essential hypertension 02/20/2014   Bradycardia 02/20/2014   Swelling of left lower extremity 02/20/2014   Rotator cuff arthropathy 10/16/2012   Carpal tunnel syndrome 10/16/2012   Carpal tunnel syndrome of right wrist 05/31/2012   Rotator cuff impingement syndrome 12/22/2011   Irreducible incisional hernia 03/15/2011   Myasthenia gravis, 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[place]) 12/03/2004   Past Medical History:  Diagnosis Date   Anxiety    Arthritis    Asthma    Avascular necrosis (HCC)    Bradycardia    symptomatic   Cancer (HCC)    COUPLE OF SKIN CANCERS   Carpal tunnel syndrome    LEFT --NUMBNESS   Cataracts, bilateral  Cerebral aneurysm    Complication of anesthesia    BP FALLS/"ANAPHYLACTIC SHOCK"/ EXTREME TREMORS;  DID  FINE WITH SURGERIES APRIL AND OCT 2013 AT [facility] SURGERIES WERE SHORT PROCEDURES--PROBLEMS WITH ANESTHESIA SEEM TO OCCUR WITH THE LONGER PROCEDURES.THE TREMORS WERE AFTER HIP REPLACEMNT 22011-ARMS WERE FLAILING--PT STATES IT TOOK 4 DOSES OF DEMEROL BEFORE THE TREMORS STOPPED.   Depression    History of skin cancer    HSV (herpes simplex virus) infection    CHEST   Hyperlipidemia    Hypertension    PT HAS LOST WEIGHT--B/P NOW WITHIN NORMALS--IS ON B/P MEDICINE   Hypertriglyceridemia    Hypothyroidism     Myasthenia gravis    DOING WELL ON CELLCEPT-FOLLOWED BY DR. LISA HOBSON - [facility]UR[facility]IST   OA (osteoarthritis) of hip    Osteopenia    Osteoporosis    Pain    LEFT SHOULDER PAIN-TORN ROTATOR CUFF-VERY LIMITED ROM   Pain    TOP OF RIGHT SHOULDER--ROM OK AT PRESENT   Pneumonia    Rash, skin    LOWER ABDOMEN-FUNGAL INFECTION-PT HAS TOPICAL OINTMENT TO USE AS NEEDED.   Seasonal allergies    Thyroid disease     Family History  Problem Relation Age of Onset   Hypertension Mother    Heart disease Mother        heart attack   Stroke Father    Heart disease Father        heart attack   Breast cancer Neg Hx     Past Surgical History:  Procedure Laterality Date   BELPHAROPTOSIS REPAIR  01/2006   PTOSIS REPAIR BILATERAL   CARPAL TUNNEL RELEASE  05/31/2012   Procedure: CARPAL TUNNEL RELEASE;  Surgeon: Mcarthur Rossetti, MD;  Location: [facility];  Service: Orthopedics;  Laterality: Left;  Left Open Carpal Tunnel Release   CARPAL TUNNEL RELEASE  10/10/2012   Procedure: CARPAL TUNNEL RELEASE;  Surgeon: Nita Sells, MD;  Location: [facility];  Service: Orthopedics;  Laterality: Left;   CEREBRAL ANEURYSM REPAIR  10/2005   GUGLIEMI COILS TO REPAIR ANEURYSM; NO RESIDUAL PROBLEMS--HAS FOLLOW UP MRI EVERY 1 OR 2 YRS   CHOLECYSTECTOMY     EXTREME DROP IN BLOOD PRESSURE WITH THIS SURGERY- YRS AGO- PT WAS 80 YRS OLD   JOINT REPLACEMENT  10/19/09   RT HIP--SEVERE TREMORS, FLAILING OF ARMS AFTER THIS SURGERY.   left shoulder arthroscopy      REVERSE SHOULDER ARTHROPLASTY  10/10/2012   Procedure: REVERSE SHOULDER ARTHROPLASTY;  Surgeon: Nita Sells, MD;  Location: [facility];  Service: Orthopedics;  Laterality: Left;   TONSILLECTOMY AND ADENOIDECTOMY     TOTAL HIP ARTHROPLASTY     right   TUBAL LIGATION     ANAPHYLACTIC SHOCK AFTER THIS SURGERY-CAUSE THOUGHT TO  BE RELATED TO SODIUM PENTOTHAL   Social History   Occupational History   Not on file  Tobacco Use   Smoking status:  Never   Smokeless tobacco: Never  Substance and Sexual Activity   Alcohol use: Yes    Comment: OCCASIONAL   Drug use: No   Sexual activity: Not on file

## 2021-04-09 ENCOUNTER — Emergency Department (HOSPITAL_COMMUNITY): Payer: Medicare Other

## 2021-04-09 ENCOUNTER — Inpatient Hospital Stay (HOSPITAL_COMMUNITY): Payer: Medicare Other

## 2021-04-09 ENCOUNTER — Inpatient Hospital Stay (HOSPITAL_COMMUNITY)
Admission: EM | Admit: 2021-04-09 | Discharge: 2021-04-13 | DRG: 062 | Disposition: A | Discharge status: Skilled Nursing Facility | Payer: Medicare Other | Source: Skilled Nursing Facility | Attending: Neurology | Admitting: Neurology

## 2021-04-09 DIAGNOSIS — R29810 Facial weakness: Secondary | ICD-10-CM | POA: Diagnosis present

## 2021-04-09 DIAGNOSIS — Z96641 Presence of right artificial hip joint: Secondary | ICD-10-CM | POA: Diagnosis present

## 2021-04-09 DIAGNOSIS — E876 Hypokalemia: Secondary | ICD-10-CM | POA: Diagnosis present

## 2021-04-09 DIAGNOSIS — J45909 Unspecified asthma, uncomplicated: Secondary | ICD-10-CM | POA: Diagnosis present

## 2021-04-09 DIAGNOSIS — G43109 Migraine with aura, not intractable, without status migrainosus: Secondary | ICD-10-CM | POA: Diagnosis present

## 2021-04-09 DIAGNOSIS — Z79899 Other long term (current) drug therapy: Secondary | ICD-10-CM | POA: Diagnosis not present

## 2021-04-09 DIAGNOSIS — S72009A Fracture of unspecified part of neck of unspecified femur, initial encounter for closed fracture: Secondary | ICD-10-CM | POA: Diagnosis present

## 2021-04-09 DIAGNOSIS — Z885 Allergy status to narcotic agent status: Secondary | ICD-10-CM | POA: Diagnosis not present

## 2021-04-09 DIAGNOSIS — M8580 Other specified disorders of bone density and structure, unspecified site: Secondary | ICD-10-CM | POA: Diagnosis not present

## 2021-04-09 DIAGNOSIS — E039 Hypothyroidism, unspecified: Secondary | ICD-10-CM | POA: Diagnosis present

## 2021-04-09 DIAGNOSIS — G459 Transient cerebral ischemic attack, unspecified: Secondary | ICD-10-CM | POA: Diagnosis present

## 2021-04-09 DIAGNOSIS — M6281 Muscle weakness (generalized): Secondary | ICD-10-CM | POA: Diagnosis not present

## 2021-04-09 DIAGNOSIS — Z85828 Personal history of other malignant neoplasm of skin: Secondary | ICD-10-CM

## 2021-04-09 DIAGNOSIS — R0902 Hypoxemia: Secondary | ICD-10-CM | POA: Diagnosis not present

## 2021-04-09 DIAGNOSIS — R41841 Cognitive communication deficit: Secondary | ICD-10-CM | POA: Diagnosis not present

## 2021-04-09 DIAGNOSIS — Z87892 Personal history of anaphylaxis: Secondary | ICD-10-CM

## 2021-04-09 DIAGNOSIS — J449 Chronic obstructive pulmonary disease, unspecified: Secondary | ICD-10-CM | POA: Diagnosis not present

## 2021-04-09 DIAGNOSIS — M1990 Unspecified osteoarthritis, unspecified site: Secondary | ICD-10-CM | POA: Diagnosis not present

## 2021-04-09 DIAGNOSIS — G4489 Other headache syndrome: Secondary | ICD-10-CM | POA: Diagnosis not present

## 2021-04-09 DIAGNOSIS — J019 Acute sinusitis, unspecified: Secondary | ICD-10-CM | POA: Diagnosis not present

## 2021-04-09 DIAGNOSIS — H53461 Homonymous bilateral field defects, right side: Secondary | ICD-10-CM | POA: Diagnosis present

## 2021-04-09 DIAGNOSIS — E871 Hypo-osmolality and hyponatremia: Secondary | ICD-10-CM | POA: Diagnosis present

## 2021-04-09 DIAGNOSIS — I69328 Other speech and language deficits following cerebral infarction: Secondary | ICD-10-CM | POA: Diagnosis not present

## 2021-04-09 DIAGNOSIS — R404 Transient alteration of awareness: Secondary | ICD-10-CM | POA: Diagnosis not present

## 2021-04-09 DIAGNOSIS — R29818 Other symptoms and signs involving the nervous system: Secondary | ICD-10-CM | POA: Diagnosis not present

## 2021-04-09 DIAGNOSIS — R29712 NIHSS score 12: Secondary | ICD-10-CM | POA: Diagnosis present

## 2021-04-09 DIAGNOSIS — I6389 Other cerebral infarction: Secondary | ICD-10-CM | POA: Diagnosis not present

## 2021-04-09 DIAGNOSIS — Z96612 Presence of left artificial shoulder joint: Secondary | ICD-10-CM | POA: Diagnosis present

## 2021-04-09 DIAGNOSIS — I10 Essential (primary) hypertension: Secondary | ICD-10-CM

## 2021-04-09 DIAGNOSIS — R000 Tachycardia, unspecified: Secondary | ICD-10-CM | POA: Diagnosis not present

## 2021-04-09 DIAGNOSIS — R4182 Altered mental status, unspecified: Secondary | ICD-10-CM | POA: Diagnosis not present

## 2021-04-09 DIAGNOSIS — Z9109 Other allergy status, other than to drugs and biological substances: Secondary | ICD-10-CM | POA: Diagnosis not present

## 2021-04-09 DIAGNOSIS — E785 Hyperlipidemia, unspecified: Secondary | ICD-10-CM | POA: Diagnosis present

## 2021-04-09 DIAGNOSIS — Z91041 Radiographic dye allergy status: Secondary | ICD-10-CM | POA: Diagnosis not present

## 2021-04-09 DIAGNOSIS — M810 Age-related osteoporosis without current pathological fracture: Secondary | ICD-10-CM | POA: Diagnosis not present

## 2021-04-09 DIAGNOSIS — Z9049 Acquired absence of other specified parts of digestive tract: Secondary | ICD-10-CM

## 2021-04-09 DIAGNOSIS — I351 Nonrheumatic aortic (valve) insufficiency: Secondary | ICD-10-CM | POA: Diagnosis present

## 2021-04-09 DIAGNOSIS — E781 Pure hyperglyceridemia: Secondary | ICD-10-CM | POA: Diagnosis present

## 2021-04-09 DIAGNOSIS — S72001D Fracture of unspecified part of neck of right femur, subsequent encounter for closed fracture with routine healing: Secondary | ICD-10-CM | POA: Diagnosis not present

## 2021-04-09 DIAGNOSIS — G7000 Myasthenia gravis without (acute) exacerbation: Secondary | ICD-10-CM | POA: Diagnosis present

## 2021-04-09 DIAGNOSIS — Z20822 Contact with and (suspected) exposure to covid-19: Secondary | ICD-10-CM | POA: Diagnosis present

## 2021-04-09 DIAGNOSIS — Z8249 Family history of ischemic heart disease and other diseases of the circulatory system: Secondary | ICD-10-CM | POA: Diagnosis not present

## 2021-04-09 DIAGNOSIS — R5381 Other malaise: Secondary | ICD-10-CM | POA: Diagnosis not present

## 2021-04-09 DIAGNOSIS — Z8673 Personal history of transient ischemic attack (TIA), and cerebral infarction without residual deficits: Secondary | ICD-10-CM | POA: Diagnosis present

## 2021-04-09 DIAGNOSIS — R4781 Slurred speech: Secondary | ICD-10-CM | POA: Diagnosis not present

## 2021-04-09 DIAGNOSIS — I69828 Other speech and language deficits following other cerebrovascular disease: Secondary | ICD-10-CM | POA: Diagnosis not present

## 2021-04-09 DIAGNOSIS — R2681 Unsteadiness on feet: Secondary | ICD-10-CM | POA: Diagnosis not present

## 2021-04-09 DIAGNOSIS — R531 Weakness: Secondary | ICD-10-CM

## 2021-04-09 DIAGNOSIS — I959 Hypotension, unspecified: Secondary | ICD-10-CM | POA: Diagnosis not present

## 2021-04-09 DIAGNOSIS — R4701 Aphasia: Secondary | ICD-10-CM | POA: Diagnosis present

## 2021-04-09 DIAGNOSIS — Z823 Family history of stroke: Secondary | ICD-10-CM | POA: Diagnosis not present

## 2021-04-09 DIAGNOSIS — Z743 Need for continuous supervision: Secondary | ICD-10-CM | POA: Diagnosis not present

## 2021-04-09 DIAGNOSIS — R519 Headache, unspecified: Secondary | ICD-10-CM | POA: Diagnosis not present

## 2021-04-09 DIAGNOSIS — Z881 Allergy status to other antibiotic agents status: Secondary | ICD-10-CM | POA: Diagnosis not present

## 2021-04-09 DIAGNOSIS — I69351 Hemiplegia and hemiparesis following cerebral infarction affecting right dominant side: Secondary | ICD-10-CM | POA: Diagnosis not present

## 2021-04-09 DIAGNOSIS — H5340 Unspecified visual field defects: Secondary | ICD-10-CM | POA: Diagnosis present

## 2021-04-09 DIAGNOSIS — I63233 Cerebral infarction due to unspecified occlusion or stenosis of bilateral carotid arteries: Secondary | ICD-10-CM | POA: Diagnosis not present

## 2021-04-09 DIAGNOSIS — R262 Difficulty in walking, not elsewhere classified: Secondary | ICD-10-CM | POA: Diagnosis not present

## 2021-04-09 DIAGNOSIS — R410 Disorientation, unspecified: Secondary | ICD-10-CM | POA: Diagnosis not present

## 2021-04-09 DIAGNOSIS — M9701XD Periprosthetic fracture around internal prosthetic right hip joint, subsequent encounter: Secondary | ICD-10-CM

## 2021-04-09 DIAGNOSIS — I639 Cerebral infarction, unspecified: Secondary | ICD-10-CM

## 2021-04-09 DIAGNOSIS — R471 Dysarthria and anarthria: Secondary | ICD-10-CM | POA: Diagnosis present

## 2021-04-09 DIAGNOSIS — Z9181 History of falling: Secondary | ICD-10-CM | POA: Diagnosis not present

## 2021-04-09 DIAGNOSIS — Z79890 Hormone replacement therapy (postmenopausal): Secondary | ICD-10-CM

## 2021-04-09 DIAGNOSIS — M48061 Spinal stenosis, lumbar region without neurogenic claudication: Secondary | ICD-10-CM | POA: Diagnosis not present

## 2021-04-09 LAB — RAPID URINE DRUG SCREEN, HOSP PERFORMED
Amphetamines: NOT DETECTED
Barbiturates: NOT DETECTED
Benzodiazepines: POSITIVE — AB
Cocaine: NOT DETECTED
Opiates: NOT DETECTED
Tetrahydrocannabinol: NOT DETECTED

## 2021-04-09 LAB — CBC
HCT: 37.2 % (ref 36.0–46.0)
Hemoglobin: 12.3 g/dL (ref 12.0–15.0)
MCH: 28.9 pg (ref 26.0–34.0)
MCHC: 33.1 g/dL (ref 30.0–36.0)
MCV: 87.5 fL (ref 80.0–100.0)
Platelets: 504 10*3/uL — ABNORMAL HIGH (ref 150–400)
RBC: 4.25 MIL/uL (ref 3.87–5.11)
RDW: 13.5 % (ref 11.5–15.5)
WBC: 5.7 10*3/uL (ref 4.0–10.5)
nRBC: 0 % (ref 0.0–0.2)

## 2021-04-09 LAB — COMPREHENSIVE METABOLIC PANEL
ALT: 18 U/L (ref 0–44)
AST: 23 U/L (ref 15–41)
Albumin: 3.2 g/dL — ABNORMAL LOW (ref 3.5–5.0)
Alkaline Phosphatase: 129 U/L — ABNORMAL HIGH (ref 38–126)
Anion gap: 9 (ref 5–15)
BUN: 6 mg/dL — ABNORMAL LOW (ref 8–23)
CO2: 22 mmol/L (ref 22–32)
Calcium: 9.2 mg/dL (ref 8.9–10.3)
Chloride: 101 mmol/L (ref 98–111)
Creatinine, Ser: 0.73 mg/dL (ref 0.44–1.00)
GFR, Estimated: >60 mL/min (ref >60)
Glucose, Bld: 114 mg/dL — ABNORMAL HIGH (ref 70–99)
Potassium: 3.3 mmol/L — ABNORMAL LOW (ref 3.5–5.1)
Sodium: 132 mmol/L — ABNORMAL LOW (ref 135–145)
Total Bilirubin: 0.8 mg/dL (ref 0.3–1.2)
Total Protein: 5.6 g/dL — ABNORMAL LOW (ref 6.5–8.1)

## 2021-04-09 LAB — I-STAT CHEM 8, ED
BUN: 5 mg/dL — ABNORMAL LOW (ref 8–23)
Calcium, Ion: 1.02 mmol/L — ABNORMAL LOW (ref 1.15–1.40)
Chloride: 100 mmol/L (ref 98–111)
Creatinine, Ser: 0.6 mg/dL (ref 0.44–1.00)
Glucose, Bld: 114 mg/dL — ABNORMAL HIGH (ref 70–99)
HCT: 35 % — ABNORMAL LOW (ref 36.0–46.0)
Hemoglobin: 11.9 g/dL — ABNORMAL LOW (ref 12.0–15.0)
Potassium: 3.3 mmol/L — ABNORMAL LOW (ref 3.5–5.1)
Sodium: 132 mmol/L — ABNORMAL LOW (ref 135–145)
TCO2: 23 mmol/L (ref 22–32)

## 2021-04-09 LAB — DIFFERENTIAL
Abs Immature Granulocytes: 0.04 10*3/uL (ref 0.00–0.07)
Basophils Absolute: 0 10*3/uL (ref 0.0–0.1)
Basophils Relative: 1 %
Eosinophils Absolute: 0 10*3/uL (ref 0.0–0.5)
Eosinophils Relative: 1 %
Immature Granulocytes: 1 %
Lymphocytes Relative: 20 %
Lymphs Abs: 1.1 10*3/uL (ref 0.7–4.0)
Monocytes Absolute: 0.6 10*3/uL (ref 0.1–1.0)
Monocytes Relative: 11 %
Neutro Abs: 3.9 10*3/uL (ref 1.7–7.7)
Neutrophils Relative %: 66 %

## 2021-04-09 LAB — RESP PANEL BY RT-PCR (FLU A&B, COVID) ARPGX2
Influenza A by PCR: NEGATIVE
Influenza B by PCR: NEGATIVE
SARS Coronavirus 2 by RT PCR: NEGATIVE

## 2021-04-09 LAB — URINALYSIS, ROUTINE W REFLEX MICROSCOPIC
Bilirubin Urine: NEGATIVE
Glucose, UA: NEGATIVE mg/dL
Hgb urine dipstick: NEGATIVE
Ketones, ur: 5 mg/dL — AB
Leukocytes,Ua: NEGATIVE
Nitrite: NEGATIVE
Protein, ur: NEGATIVE mg/dL
Specific Gravity, Urine: 1.027 (ref 1.005–1.030)
pH: 9 — ABNORMAL HIGH (ref 5.0–8.0)

## 2021-04-09 LAB — CBG MONITORING, ED: Glucose-Capillary: 116 mg/dL — ABNORMAL HIGH (ref 70–99)

## 2021-04-09 LAB — APTT: aPTT: 31 seconds (ref 24–36)

## 2021-04-09 LAB — PROTIME-INR
INR: 1.1 (ref 0.8–1.2)
Prothrombin Time: 14.1 seconds (ref 11.4–15.2)

## 2021-04-09 MED ORDER — MORPHINE SULFATE (PF) 4 MG/ML IV SOLN
4.0000 mg | Freq: Once | INTRAVENOUS | Status: AC
Start: 2021-04-09 — End: 2021-04-09
  Administered 2021-04-09: 4 mg via INTRAVENOUS
  Filled 2021-04-09: qty 1

## 2021-04-09 MED ORDER — MYCOPHENOLATE MOFETIL 250 MG PO CAPS
1000.0000 mg | ORAL_CAPSULE | Freq: Two times a day (BID) | ORAL | Status: DC
  Administered 2021-04-10 – 2021-04-12 (×7): 1000 mg via ORAL
  Filled 2021-04-09 (×7): qty 4

## 2021-04-09 MED ORDER — IOHEXOL 350 MG/ML SOLN
100.0000 mL | Freq: Once | INTRAVENOUS | Status: AC | PRN
  Administered 2021-04-09: 100 mL via INTRAVENOUS

## 2021-04-09 MED ORDER — ASPIRIN 325 MG PO TABS: 325.0000 mg | ORAL_TABLET | Freq: Every day | ORAL | Status: DC

## 2021-04-09 MED ORDER — ACETAMINOPHEN 160 MG/5ML PO SOLN: 650.0000 mg | ORAL | Status: DC | PRN

## 2021-04-09 MED ORDER — ACETAMINOPHEN 325 MG PO TABS: 650.0000 mg | ORAL_TABLET | ORAL | Status: DC | PRN

## 2021-04-09 MED ORDER — FLUTICASONE-UMECLIDIN-VILANT 200-62.5-25 MCG/INH IN AEPB: 1.0000 | INHALATION_SPRAY | Freq: Every day | RESPIRATORY_TRACT | Status: DC

## 2021-04-09 MED ORDER — STROKE: EARLY STAGES OF RECOVERY BOOK
Freq: Once | Status: AC
  Administered 2021-04-10: 1
  Filled 2021-04-09: qty 1

## 2021-04-09 MED ORDER — TRAZODONE HCL 50 MG PO TABS
75.0000 mg | ORAL_TABLET | Freq: Every day | ORAL | Status: DC
  Administered 2021-04-10 – 2021-04-12 (×4): 75 mg via ORAL
  Filled 2021-04-09 (×4): qty 2

## 2021-04-09 MED ORDER — ALPRAZOLAM 0.5 MG PO TABS
0.5000 mg | ORAL_TABLET | Freq: Four times a day (QID) | ORAL | Status: DC
  Administered 2021-04-09 – 2021-04-12 (×12): 0.5 mg via ORAL
  Filled 2021-04-09 (×5): qty 1
  Filled 2021-04-09: qty 2
  Filled 2021-04-09: qty 1
  Filled 2021-04-09: qty 2
  Filled 2021-04-09 (×4): qty 1

## 2021-04-09 MED ORDER — LEVOTHYROXINE SODIUM 75 MCG PO TABS
75.0000 ug | ORAL_TABLET | Freq: Every day | ORAL | Status: DC
  Administered 2021-04-11 – 2021-04-12 (×2): 75 ug via ORAL
  Filled 2021-04-09 (×3): qty 1

## 2021-04-09 MED ORDER — SIMETHICONE 80 MG PO CHEW: 160.0000 mg | CHEWABLE_TABLET | Freq: Four times a day (QID) | ORAL | Status: DC | PRN

## 2021-04-09 MED ORDER — ENOXAPARIN SODIUM 40 MG/0.4ML IJ SOSY
40.0000 mg | PREFILLED_SYRINGE | Freq: Every day | INTRAMUSCULAR | Status: DC
  Administered 2021-04-10: 40 mg via SUBCUTANEOUS
  Filled 2021-04-09: qty 0.4

## 2021-04-09 MED ORDER — OXYCODONE-ACETAMINOPHEN 5-325 MG PO TABS
1.0000 | ORAL_TABLET | Freq: Four times a day (QID) | ORAL | Status: DC | PRN
  Administered 2021-04-09 – 2021-04-12 (×4): 1 via ORAL
  Filled 2021-04-09 (×4): qty 1

## 2021-04-09 MED ORDER — FLUOXETINE HCL 20 MG PO CAPS
20.0000 mg | ORAL_CAPSULE | Freq: Every morning | ORAL | Status: DC
  Administered 2021-04-10 – 2021-04-12 (×3): 20 mg via ORAL
  Filled 2021-04-09 (×2): qty 1

## 2021-04-09 MED ORDER — ADULT MULTIVITAMIN W/MINERALS CH
1.0000 | ORAL_TABLET | Freq: Every day | ORAL | Status: DC
  Administered 2021-04-10 – 2021-04-12 (×3): 1 via ORAL
  Filled 2021-04-09 (×3): qty 1

## 2021-04-09 MED ORDER — ACETAMINOPHEN 650 MG RE SUPP: 650.0000 mg | RECTAL | Status: DC | PRN

## 2021-04-09 MED ORDER — MONTELUKAST SODIUM 10 MG PO TABS
10.0000 mg | ORAL_TABLET | Freq: Every day | ORAL | Status: DC
  Administered 2021-04-10 – 2021-04-12 (×4): 10 mg via ORAL
  Filled 2021-04-09 (×4): qty 1

## 2021-04-09 NOTE — ED Notes (Signed)
Provider at the bedside.  

## 2021-04-09 NOTE — H&P (Signed)
History and Physical    [name] [id number] DOB: 1940-09-14 DOA: 04/09/2021  PCP: Ginger Organ., MD  Patient coming from: Rehab  I have personally briefly reviewed patient's old medical records in [place]  Chief Complaint: Stroke  HPI: Sabrina [name] is a 80 y.o. female with medical history significant of Myasthenia Gravis, HTN, HLD, cerebral aneurysm s/p coiling.  Pt recently had R hip Fx and surgical repair 7/20-7/22.  Pt discharged to Rehab.  Pt LKW 1400.  Pt family arrived to Rehab today and pt with AMS, unable to recognize them.  EMS called and pt brought to ED as a code stroke.     ED Course: Initially pt only able to answer questions by counting numbers, didn't say words.  Initial NIHSS 12.  This has imiproved some what during the course of her stay.  Now able to answer some questions and say some words.  Still significant deficits on exam though.  CT head perfusion study confirms 30m volume stroke.  No emergent LVO.  Due to time since LKW pt not candidate for TPA.  SBP running 190 in ED.  ROS positive for R hip pain.   Review of Systems: As per HPI, otherwise all review of systems negative.  Past Medical History:  Diagnosis Date   Anxiety    Arthritis    Asthma    Avascular necrosis (HCC)    Bradycardia    symptomatic   Cancer (HCC)    COUPLE OF SKIN CANCERS   Carpal tunnel syndrome    LEFT --NUMBNESS   Cataracts, bilateral    Cerebral aneurysm    Complication of anesthesia    BP FALLS/"ANAPHYLACTIC SHOCK"/ EXTREME TREMORS;  [facility]PROBLEMS WITH ANESTHESIA SEEM TO OCCUR WITH THE LONGER PROCEDURES.THE TREMORS WERE AFTER HIP REPLACEMNT 22011-ARMS WERE FLAILING--PT STATES IT TOOK 4 DOSES OF DEMEROL BEFORE THE TREMORS STOPPED.   Depression    History of skin cancer    HSV (herpes simplex virus) infection    CHEST   Hyperlipidemia    Hypertension    PT  HAS LOST WEIGHT--B/P NOW WITHIN NORMALS--IS ON B/P MEDICINE   Hypertriglyceridemia    Hypothyroidism    Myasthenia gravis    DOING WELL ON CELLCEPT-FOLLOWED BY DR. LISA HOBSON - [facility]IST   OA (osteoarthritis) of hip    Osteopenia    Osteoporosis    Pain    LEFT SHOULDER PAIN-TORN ROTATOR CUFF-VERY LIMITED ROM   Pain    TOP OF RIGHT SHOULDER--ROM OK AT PRESENT   Pneumonia    Rash, skin    LOWER ABDOMEN-FUNGAL INFECTION-PT HAS TOPICAL OINTMENT TO USE AS NEEDED.   Seasonal allergies    Thyroid disease     Past Surgical History:  Procedure Laterality Date   BELPHAROPTOSIS REPAIR  01/2006   PTOSIS REPAIR BILATERAL   CARPAL TUNNEL RELEASE  05/31/2012   Procedure: CARPAL TUNNEL RELEASE;  Surgeon: CMcarthur Rossetti MD;  Location: [facility];  Service: Orthopedics;  Laterality: Left;  Left Open Carpal Tunnel Release   CARPAL TUNNEL RELEASE  10/10/2012   Procedure: CARPAL TUNNEL RELEASE;  Surgeon: JNita Sells MD;  Location: [facility];  Service: Orthopedics;  Laterality: Left;   CEREBRAL ANEURYSM REPAIR  10/2005   GUGLIEMI COILS TO REPAIR ANEURYSM; NO RESIDUAL PROBLEMS--HAS FOLLOW UP MRI EVERY 1 OR 2 YRS   CHOLECYSTECTOMY     EXTREME DROP IN BLOOD  PRESSURE WITH THIS SURGERY- YRS AGO- PT WAS 80 YRS OLD   JOINT REPLACEMENT  10/19/09   RT HIP--SEVERE TREMORS, FLAILING OF ARMS AFTER THIS SURGERY.   left shoulder arthroscopy      REVERSE SHOULDER ARTHROPLASTY  10/10/2012   Procedure: REVERSE SHOULDER ARTHROPLASTY;  Surgeon: Nita Sells, MD;  Location: [facility];  Service: Orthopedics;  Laterality: Left;   TONSILLECTOMY AND ADENOIDECTOMY     TOTAL HIP ARTHROPLASTY     right   TUBAL LIGATION     ANAPHYLACTIC SHOCK AFTER THIS SURGERY-CAUSE THOUGHT TO  BE RELATED TO SODIUM PENTOTHAL     reports that she has never smoked. She has never used smokeless tobacco. She reports current alcohol use. She reports that she does not use drugs.  Allergies  Allergen Reactions    Azathioprine Anaphylaxis and Other (See Comments)    Kidneys shut down   Other Anaphylaxis    Sodium pentathol Uncoded Allergy. Allergen: Sodium pentathol   Aminoglycosides     Tobramycin, gentamycin, kanamycin, neomycin, streptomycin Can't take because of myasthenia gravis   Avelox [Moxifloxacin Hcl In Nacl] Other (See Comments)    Can't take because of myasthenia gravis   Beta Adrenergic Blockers     Proponolol, timolol maleate eyedrops Can't take because of myasthenia gravis   Calcium Channel Blockers     Blood pressure medications Can't take because of myasthenia gravis   Cephalosporins     Can't take because of myasthenia gravis   Ciprofloxacin Other (See Comments)    Can't take because of myasthenia gravis Can't take because of myasthenia gravis   Clindamycin/Lincomycin     May exascerbate myasthenia gravis   Colistin     Can't take because of myasthenia gravis   Fenofibrate     Other reaction(s): GI upset, myalgia   Fosamax [Alendronate Sodium]     LEG WEAKNESS   Imuran [Azathioprine Sodium] Other (See Comments)    Kidneys shut down   Iodinated Diagnostic Agents Other (See Comments)    Contraindicated to Myasthenia Gravis   Iodine     Iodine contrast Can't take because of myasthenia gravis   Macrolides And Ketolides     Erythromocin, azithromycin, telithromycin Can't take because of myasthenia gravis   Magnesium-Containing Compounds     Including milk of magnesia, antacids containing magnesium hydroxide (maalox, mylanta) and epsom salts Can't take because of myasthenia gravis   Moxifloxacin Other (See Comments)    Can't take because of myasthenia gravis   Niacin And Related     Other reaction(s): leg aches   Norfloxacin Other (See Comments)    Can't take because of myasthenia gravis Can't take because of myasthenia gravis   Ofloxacin Other (See Comments)    Can't take because of myasthenia gravis Can't take because of myasthenia gravis   Pefloxacin Other  (See Comments)    Can't take because of myasthenia gravis Can't take because of myasthenia gravis   Penicillamine     do not use due to Myasthenia Gravis     Prednisolone Nausea And Vomiting   Prednisone Nausea And Vomiting   Procainamide     Can't take because of myasthenia gravis   Quinidine     Can't take because of myasthenia gravis   Quinine Derivatives     Can't take because of myasthenia gravis   Succinylcholine     Can't take because of myasthenia gravis No paralytics   Tubocurarine     Can't take because of myasthenia gravis  Vecuronium     Can't take because of myasthenia gravis No paralytics   Vicodin [Hydrocodone-Acetaminophen] Other (See Comments)    Pt suffers from bad headaches    Family History  Problem Relation Age of Onset   Hypertension Mother    Heart disease Mother        heart attack   Stroke Father    Heart disease Father        heart attack   Breast cancer Neg Hx      Prior to Admission medications   Medication Sig Start Date End Date Taking? Authorizing Provider  ALPRAZolam Duanne Moron) 0.5 MG tablet Take 1 tablet (0.5 mg total) by mouth every 6 (six) hours. 03/25/21  Yes Eugenie Filler, MD  amoxicillin (AMOXIL) 500 MG capsule Take 2,000 mg by mouth daily as needed (before dental procedures). 02/24/19  Yes [provider]  Ascorbic Acid (VITAMIN C) 1000 MG tablet Take 1,000 mg by mouth every morning.    Yes [provider]  Cholecalciferol (VITAMIN D-3 PO) Take 1,000 mg by mouth every morning.    Yes [provider]  Dextromethorphan-guaiFENesin (GUAIFENESIN DM) 400-20 MG TABS Take 1 tablet by mouth 3 (three) times daily.   Yes [provider]  diclofenac Sodium (VOLTAREN) 1 % GEL Apply 2 g topically every 6 (six) hours as needed (pain).   Yes [provider]  enoxaparin (LOVENOX) 40 MG/0.4ML injection Inject 0.4 mLs (40 mg total) into the skin daily. 03/25/21  Yes Eugenie Filler, MD  FLUoxetine  (PROZAC) 20 MG tablet Take 20 mg by mouth every morning.    Yes [provider]  fluticasone (FLONASE) 50 MCG/ACT nasal spray Place 2 sprays into both nostrils daily. 07/27/17  Yes [provider]  Ibuprofen-Diphenhydramine Cit 200-38 MG TABS Take 1 tablet by mouth at bedtime.   Yes [provider]  Lactase (LACTAID PO) Take 1 tablet by mouth daily as needed (before dairy).   Yes [provider]  levothyroxine (SYNTHROID, LEVOTHROID) 75 MCG tablet Take 75 mcg by mouth every morning. MUST TAKE BRAND NAME SYNTHROID   Yes [provider]  montelukast (SINGULAIR) 10 MG tablet Take 10 mg by mouth at bedtime. MUST BE BRAND NAME ONLY-SINGULAIR   Yes [provider]  Multiple Vitamins-Minerals (CENTRUM SILVER) tablet Take 1 tablet by mouth daily. 02/16/11  Yes [provider]  mycophenolate (CELLCEPT) 500 MG tablet Take 1,000 mg by mouth 2 (two) times daily.    Yes [provider]  Omega 3 1200 MG CAPS Take 1,200 mg by mouth daily.   Yes [provider]  oxyCODONE-acetaminophen (PERCOCET/ROXICET) 5-325 MG tablet Take 1 tablet by mouth 4 (four) times daily as needed for severe pain. Patient taking differently: Take 1 tablet by mouth every 6 (six) hours as needed for severe pain. 03/25/21  Yes Eugenie Filler, MD  simethicone (MYLICON) 80 MG chewable tablet Chew 2 tablets (160 mg total) by mouth 4 (four) times daily as needed for flatulence. Patient taking differently: Chew 160 mg by mouth every 6 (six) hours as needed for flatulence. 03/25/21  Yes Eugenie Filler, MD  traZODone (DESYREL) 150 MG tablet Take 75 mg by mouth at bedtime.    Yes [provider]  TRELEGY ELLIPTA 200-62.5-25 MCG/INH AEPB Inhale 1 puff into the lungs daily. 02/28/21  Yes [provider]  valsartan (DIOVAN) 160 MG tablet Take 1 tablet (160 mg total) by mouth daily. 03/25/21 03/25/22 Yes Eugenie Filler, MD  Physical Exam: Vitals:    04/09/21 2030 04/09/21 2045 04/09/21 2048 04/09/21 2100  BP: 96/78 (!) 128/115  (!) 178/62  Pulse: 70 70  71  Resp: 17 20  (!) 21  Temp:   99.6 F (37.6 C)   TempSrc:   Rectal   SpO2: 99% 98%  97%    Constitutional: NAD, calm, comfortable Eyes: PERRL, lids and conjunctivae normal ENMT: Mucous membranes are moist. Posterior pharynx clear of any exudate or lesions.Normal dentition.  Neck: normal, supple, no masses, no thyromegaly Respiratory: clear to auscultation bilaterally, no wheezing, no crackles. Normal respiratory effort. No accessory muscle use.  Cardiovascular: Regular rate and rhythm, no murmurs / rubs / gallops. No extremity edema. 2+ pedal pulses. No carotid bruits.  Abdomen: no tenderness, no masses palpated. No hepatosplenomegaly. Bowel sounds positive.  Musculoskeletal: TTP R hip Skin: no rashes, lesions, ulcers. No induration Neurologic: Pt with some aphasia, MAE, some R sided weakness, not sure about hemianopia (held up 1 finger on the patients Left, 2 on the R of her visual fields and she told me I was holding up "five") Psychiatric: Pleasant mood, not really able to assess further due to aphasia.   Labs on Admission: I have personally reviewed following labs and imaging studies  CBC: Recent Labs  Lab 04/09/21 2022 04/09/21 2042  WBC 5.7  --   NEUTROABS 3.9  --   HGB 12.3 11.9*  HCT 37.2 35.0*  MCV 87.5  --   PLT 504*  --    Basic Metabolic Panel: Recent Labs  Lab 04/09/21 2022 04/09/21 2042  NA 132* 132*  K 3.3* 3.3*  CL 101 100  CO2 22  --   GLUCOSE 114* 114*  BUN 6* 5*  CREATININE 0.73 0.60  CALCIUM 9.2  --    GFR: CrCl cannot be calculated (Unknown ideal weight.). Liver Function Tests: Recent Labs  Lab 04/09/21 2022  AST 23  ALT 18  ALKPHOS 129*  BILITOT 0.8  PROT 5.6*  ALBUMIN 3.2*   No results for input(s): LIPASE, AMYLASE in the last 168 hours. No results for input(s): AMMONIA in the last 168 hours. Coagulation  Profile: Recent Labs  Lab 04/09/21 2022  INR 1.1   Cardiac Enzymes: No results for input(s): CKTOTAL, CKMB, CKMBINDEX, TROPONINI in the last 168 hours. BNP (last 3 results) No results for input(s): PROBNP in the last 8760 hours. HbA1C: No results for input(s): HGBA1C in the last 72 hours. CBG: Recent Labs  Lab 04/09/21 1930  GLUCAP 116*   Lipid Profile: No results for input(s): CHOL, HDL, LDLCALC, TRIG, CHOLHDL, LDLDIRECT in the last 72 hours. Thyroid Function Tests: No results for input(s): TSH, T4TOTAL, FREET4, T3FREE, THYROIDAB in the last 72 hours. Anemia Panel: No results for input(s): VITAMINB12, FOLATE, FERRITIN, TIBC, IRON, RETICCTPCT in the last 72 hours. Urine analysis:    Component Value Date/Time   COLORURINE STRAW (A) 04/09/2021 2051   APPEARANCEUR CLEAR 04/09/2021 2051   LABSPEC 1.027 04/09/2021 2051   PHURINE 9.0 (H) 04/09/2021 2051   GLUCOSEU NEGATIVE 04/09/2021 2051   HGBUR NEGATIVE 04/09/2021 2051   BILIRUBINUR NEGATIVE 04/09/2021 2051   KETONESUR 5 (A) 04/09/2021 2051   PROTEINUR NEGATIVE 04/09/2021 2051   UROBILINOGEN 0.2 10/04/2012 1445   NITRITE NEGATIVE 04/09/2021 2051   LEUKOCYTESUR NEGATIVE 04/09/2021 2051    Radiological Exams on Admission: CT HEAD CODE STROKE WO CONTRAST  Result Date: 04/09/2021 CLINICAL DATA:  Code stroke.  Acute neurologic deficit EXAM: CT HEAD WITHOUT CONTRAST TECHNIQUE:  Contiguous axial images were obtained from the base of the skull through the vertex without intravenous contrast. COMPARISON:  None. FINDINGS: Brain: There is no mass, hemorrhage or extra-axial collection. There is generalized atrophy without lobar predilection. There is hypoattenuation of the periventricular white matter, most commonly indicating chronic ischemic microangiopathy. Vascular: Aneurysm coils at the anterior communicating artery. Skull: The visualized skull base, calvarium and extracranial soft tissues are normal. Sinuses/Orbits: No fluid levels or  advanced mucosal thickening of the visualized paranasal sinuses. No mastoid or middle ear effusion. The orbits are normal. ASPECTS [facility] Stroke Program Early CT Score) - Ganglionic level infarction (caudate, lentiform nuclei, internal capsule, insula, M1-M3 cortex): 7 - Supraganglionic infarction (M4-M6 cortex): 3 Total score (0-10 with 10 being normal): 10 IMPRESSION: 1. No acute intracranial abnormality. 2. ASPECTS is 10. These results were communicated to Dr. Roland Rack at 7:56 pm on 04/09/2021 by text page via the [facility] messaging system. Electronically Signed   By: Ulyses Jarred M.D.   On: 04/09/2021 19:55   CT ANGIO HEAD NECK W WO CM W PERF (CODE STROKE)  Result Date: 04/09/2021 CLINICAL DATA:  Stroke follow-up FINDINGS: CTA NECK FINDINGS SKELETON: There is no bony spinal canal stenosis. No lytic or blastic lesion. OTHER NECK: Normal pharynx, larynx and major salivary glands. No cervical lymphadenopathy. Unremarkable thyroid gland. UPPER CHEST: No pneumothorax or pleural effusion. No nodules or masses. AORTIC ARCH: There is calcific atherosclerosis of the aortic arch. There is no aneurysm, dissection or hemodynamically significant stenosis of the visualized portion of the aorta. Conventional 3 vessel aortic branching pattern. The visualized proximal subclavian arteries are widely patent. RIGHT CAROTID SYSTEM: Normal without aneurysm, dissection or stenosis. LEFT CAROTID SYSTEM: Normal without aneurysm, dissection or stenosis. VERTEBRAL ARTERIES: Left dominant configuration. Both origins are clearly patent. There is no dissection, occlusion or flow-limiting stenosis to the skull base (V1-V3 segments). CTA HEAD FINDINGS POSTERIOR CIRCULATION: --Vertebral arteries: Normal V4 segments. --Inferior cerebellar arteries: Normal. --Basilar artery: Normal. --Superior cerebellar arteries: Normal. --Posterior cerebral arteries (PCA): Normal. ANTERIOR CIRCULATION: --Intracranial internal carotid arteries:  Normal. --Anterior cerebral arteries (ACA): Diminutive right A1 segment. Coil mass at the anterior communicating artery. --Middle cerebral arteries (MCA): Normal. VENOUS SINUSES: As permitted by contrast timing, patent. ANATOMIC VARIANTS: None Review of the MIP images confirms the above findings. CT PERFUSION FINDINGS CBF <30%: 0 mL Tmax > 6.0s: 16 mL Mismatch volume: 16 mL Infarction location: No completed infarction. Areas of ischemia in the left occipital and posterior parietal lobes. IMPRESSION: 1. No emergent large vessel occlusion or high-grade stenosis of the intracranial arteries. 2. Coil mass at the anterior communicating artery. 3. 16 mL area of ischemia in the left occipital and posterior parietal lobes. These results were called by telephone at the time of interpretation on 04/09/2021 at 8:22 pm to provider MCNEILL [facility] , who verbally acknowledged these results. Aortic Atherosclerosis (ICD10-I70.0). Electronically Signed   By: Ulyses Jarred M.D.   On: 04/09/2021 20:23    EKG: Independently reviewed.  Assessment/Plan Principal Problem:   Acute ischemic stroke [facility]) Active Problems:   Essential hypertension   Myasthenia gravis, 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HCC)   Hip fracture (HCC)    Acute ischemic stroke - Demonstrated on CT perfusion study Significant aphasia still Stroke pathway Tele monitor 2d echo MRI PT/OT/SLP ASA Frequent neuro checks Neuro consult Passed swallow screen HTN - Hold home BP meds and allow permissive HTN Myasthenia gravis - Cont mycophenolate Recent Hip fx - Cont pain control PT/OT  DVT prophylaxis: Lovenox  Code Status: Full Family Communication: Family at bedside Disposition Plan: Rehab after admit Consults called: Dr. Leonel Ramsay Admission status: Admit to inpatient  Severity of Illness: The appropriate patient status for this patient is INPATIENT. Inpatient status is judged to be reasonable and necessary in order to provide the required  intensity of service to ensure the patient's safety. The patient's presenting symptoms, physical exam findings, and initial radiographic and laboratory data in the context of their chronic comorbidities is felt to place them at high risk for further clinical deterioration. Furthermore, it is not anticipated that the patient will be medically stable for discharge from the hospital within 2 midnights of admission. The following factors support the patient status of inpatient.   IP status due to acute ischemic stroke with significant deficits, aphasia.  * I certify that at the point of admission it is my clinical judgment that the patient will require inpatient hospital care spanning beyond 2 midnights from the point of admission due to high intensity of service, high risk for further deterioration and high frequency of surveillance required.*   Icker Swigert M. DO [facility]  How to contact the [facility] Attending or Consulting provider [place] or covering provider during after hours [place], for this patient?  Check the care team in [facility] and look for a) attending/consulting [facility] provider listed and b) the [facility] team listed Log into [email or web address]  Amion Physician Scheduling and messaging for groups and whole hospitals  On call and physician scheduling software for group practices, residents, hospitalists and other medical providers for call, clinic, rotation and shift schedules. On[employer] is a hospital-wide system for scheduling doctors and paging doctors on call. EasyPlot is for scientific plotting and data analysis.  [email or web address]  and use [place]'s universal password to access. If you do not have the password, please contact the hospital operator.  Locate the [facility] provider you are looking for under [facility] and page to a number that you can be directly reached. If you still have difficulty reaching the provider, please page the [facility] (Director on Call) for the Hospitalists listed  on amion for assistance.  04/09/2021, 10:53 PM

## 2021-04-09 NOTE — ED Triage Notes (Signed)
Pt bib ems from [facility] for rehabilitation of right fx hip. Pt was LKW 14:00 when she called to speak to family she mentioned a headache. When family arrived the pt was not able to recognize them. Pt is able to answer some questions but goes into counting numbers. She can lift arms for at least 10 seconds.

## 2021-04-09 NOTE — Code Documentation (Signed)
Stroke Response Nurse Documentation Code Documentation  JENAFER DONNELLY is a 80 y.o. female arriving to [place] ED via [place] [facility] on 8/6 with past medical hx of HLD, HTN, Cerebral Aneurysm repair. Code stroke was activated by ED. Patient from SNF where she was LKW at 1400 and now complaining of Apshasia and right sided weakness. On No antithrombotic. Stroke team at the bedside on patient arrival. Labs drawn and patient cleared for CT by Dr. Darl Householder. Patient to CT with team. NIHSS 12, see documentation for details and code stroke times. Patient with disoriented, not following commands, right hemianopia, right facial droop, right arm weakness, right leg weakness, and Expressive aphasia  on exam. The following imaging was completed:  CT, CTA head and neck, CTP. Patient is not a candidate for tPA due to Out of window. Bedside handoff with ED RN Larene Beach.    Madelynn Done  Rapid Response RN

## 2021-04-09 NOTE — ED Provider Notes (Addendum)
[facility] EMERGENCY DEPARTMENT Provider Note   CSN: [id number] Arrival date & time: 04/09/21  1928     History Chief Complaint  Patient presents with   Cerebrovascular Accident    Sabrina [name] is a 80 y.o. female presenting from skilled nursing facility as code stroke. Patient's last known normal was 1400. Patient's family arrived to the living facility and she was not able to recognize them. Patient is only able to answer questions by counting numbers and does not say words. Patient has right sided weakness.   Altered Mental Status Presenting symptoms: behavior changes, confusion and disorientation   Most recent episode:  Today Associated symptoms: weakness   Associated symptoms: no abdominal pain, normal movement, no agitation, no bladder incontinence, no decreased appetite, no depression, no difficulty breathing, no eye deviation, no fever, no hallucinations, no headaches, no light-headedness, no nausea, no palpitations, no rash, no seizures, no slurred speech and no vomiting       Past Medical History:  Diagnosis Date   Anxiety    Arthritis    Asthma    Avascular necrosis (HCC)    Bradycardia    symptomatic   Cancer (HCC)    COUPLE OF SKIN CANCERS   Carpal tunnel syndrome    LEFT --NUMBNESS   Cataracts, bilateral    Cerebral aneurysm    Complication of anesthesia    BP FALLS/"ANAPHYLACTIC SHOCK"/ EXTREME TREMORS;  DID  FINE WITH SURGERIES APRIL AND OCT 2013 AT [facility]--THE SURGERIES WERE SHORT PROCEDURES--PROBLEMS WITH ANESTHESIA SEEM TO OCCUR WITH THE LONGER PROCEDURES.THE TREMORS WERE AFTER HIP REPLACEMNT 22011-ARMS WERE FLAILING--PT STATES IT TOOK 4 DOSES OF DEMEROL BEFORE THE TREMORS STOPPED.   Depression    History of skin cancer    HSV (herpes simplex virus) infection    CHEST   Hyperlipidemia    Hypertension    PT HAS LOST WEIGHT--B/P NOW WITHIN NORMALS--IS ON B/P MEDICINE   Hypertriglyceridemia    Hypothyroidism    Myasthenia gravis     DOING WELL ON CELLCEPT-FOLLOWED BY DR. LISA HOBSON - [facility] NEUROLOGIST   OA (osteoarthritis) of hip    Osteopenia    Osteoporosis    Pain    LEFT SHOULDER PAIN-TORN ROTATOR CUFF-VERY LIMITED ROM   Pain    TOP OF RIGHT SHOULDER--ROM OK AT PRESENT   Pneumonia    Rash, skin    LOWER ABDOMEN-FUNGAL INFECTION-PT HAS TOPICAL OINTMENT TO USE AS NEEDED.   Seasonal allergies    Thyroid disease     Patient Active Problem List   Diagnosis Date Noted   Acute ischemic stroke ([place]) 04/09/2021   Fracture, proximal femur, right, closed, initial encounter ([place])    Hyponatremia 03/24/2021   Hypokalemia 03/24/2021   Anxiety 03/24/2021   Hip fracture ([place]) 03/23/2021   Unilateral primary osteoarthritis, left knee 11/26/2017   Lumbar stenosis 06/18/2015   Essential hypertension 02/20/2014   Bradycardia 02/20/2014   Swelling of left lower extremity 02/20/2014   Rotator cuff arthropathy 10/16/2012   Carpal tunnel syndrome 10/16/2012   Carpal tunnel syndrome of right wrist 05/31/2012   Rotator cuff impingement syndrome 12/22/2011   Irreducible incisional hernia 03/15/2011   Myasthenia gravis, 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[place]) 12/03/2004    Past Surgical History:  Procedure Laterality Date   BELPHAROPTOSIS REPAIR  01/2006   PTOSIS REPAIR BILATERAL   CARPAL TUNNEL RELEASE  05/31/2012   Procedure: CARPAL TUNNEL RELEASE;  Surgeon: Mcarthur Rossetti, MD;  Location: [facility];  Service: Orthopedics;  Laterality: Left;  Left Open Carpal Tunnel Release   CARPAL TUNNEL RELEASE  10/10/2012   Procedure: CARPAL TUNNEL RELEASE;  Surgeon: Nita Sells, MD;  Location: [facility];  Service: Orthopedics;  Laterality: Left;   CEREBRAL ANEURYSM REPAIR  10/2005   GUGLIEMI COILS TO REPAIR ANEURYSM; NO RESIDUAL PROBLEMS--HAS FOLLOW UP MRI EVERY 1 OR 2 YRS   CHOLECYSTECTOMY     EXTREME DROP IN BLOOD PRESSURE WITH THIS SURGERY- YRS AGO- PT WAS 80 YRS OLD   JOINT REPLACEMENT  10/19/09   RT HIP--SEVERE TREMORS, FLAILING  OF ARMS AFTER THIS SURGERY.   left shoulder arthroscopy      REVERSE SHOULDER ARTHROPLASTY  10/10/2012   Procedure: REVERSE SHOULDER ARTHROPLASTY;  Surgeon: Nita Sells, MD;  Location: [facility];  Service: Orthopedics;  Laterality: Left;   TONSILLECTOMY AND ADENOIDECTOMY     TOTAL HIP ARTHROPLASTY     right   TUBAL LIGATION     ANAPHYLACTIC SHOCK AFTER THIS SURGERY-CAUSE THOUGHT TO  BE RELATED TO SODIUM PENTOTHAL     OB History   No obstetric history on file.     Family History  Problem Relation Age of Onset   Hypertension Mother    Heart disease Mother        heart attack   Stroke Father    Heart disease Father        heart attack   Breast cancer Neg Hx     Social History   Tobacco Use   Smoking status: Never   Smokeless tobacco: Never  Substance Use Topics   Alcohol use: Yes    Comment: OCCASIONAL   Drug use: No    Home Medications Prior to Admission medications   Medication Sig Start Date End Date Taking? Authorizing Provider  ALPRAZolam Duanne Moron) 0.5 MG tablet Take 1 tablet (0.5 mg total) by mouth every 6 (six) hours. 03/25/21  Yes Eugenie Filler, MD  amoxicillin (AMOXIL) 500 MG capsule Take 2,000 mg by mouth daily as needed (before dental procedures). 02/24/19  Yes [provider]  Ascorbic Acid (VITAMIN C) 1000 MG tablet Take 1,000 mg by mouth every morning.    Yes [provider]  Cholecalciferol (VITAMIN D-3 PO) Take 1,000 mg by mouth every morning.    Yes [provider]  Dextromethorphan-guaiFENesin (GUAIFENESIN DM) 400-20 MG TABS Take 1 tablet by mouth 3 (three) times daily.   Yes [provider]  diclofenac Sodium (VOLTAREN) 1 % GEL Apply 2 g topically every 6 (six) hours as needed (pain).   Yes [provider]  enoxaparin (LOVENOX) 40 MG/0.4ML injection Inject 0.4 mLs (40 mg total) into the skin daily. 03/25/21  Yes Eugenie Filler, MD  FLUoxetine (PROZAC) 20 MG tablet Take 20 mg by mouth every morning.     Yes [provider]  fluticasone (FLONASE) 50 MCG/ACT nasal spray Place 2 sprays into both nostrils daily. 07/27/17  Yes [provider]  Ibuprofen-Diphenhydramine Cit 200-38 MG TABS Take 1 tablet by mouth at bedtime.   Yes [provider]  Lactase (LACTAID PO) Take 1 tablet by mouth daily as needed (before dairy).   Yes [provider]  levothyroxine (SYNTHROID, LEVOTHROID) 75 MCG tablet Take 75 mcg by mouth every morning. MUST TAKE BRAND NAME SYNTHROID   Yes [provider]  montelukast (SINGULAIR) 10 MG tablet Take 10 mg by mouth at bedtime. MUST BE BRAND NAME ONLY-SINGULAIR   Yes [provider]  Multiple Vitamins-Minerals (CENTRUM SILVER) tablet Take 1 tablet by mouth  daily. 02/16/11  Yes [provider]  mycophenolate (CELLCEPT) 500 MG tablet Take 1,000 mg by mouth 2 (two) times daily.    Yes [provider]  Omega 3 1200 MG CAPS Take 1,200 mg by mouth daily.   Yes [provider]  oxyCODONE-acetaminophen (PERCOCET/ROXICET) 5-325 MG tablet Take 1 tablet by mouth 4 (four) times daily as needed for severe pain. Patient taking differently: Take 1 tablet by mouth every 6 (six) hours as needed for severe pain. 03/25/21  Yes Eugenie Filler, MD  simethicone (MYLICON) 80 MG chewable tablet Chew 2 tablets (160 mg total) by mouth 4 (four) times daily as needed for flatulence. Patient taking differently: Chew 160 mg by mouth every 6 (six) hours as needed for flatulence. 03/25/21  Yes Eugenie Filler, MD  traZODone (DESYREL) 150 MG tablet Take 75 mg by mouth at bedtime.    Yes [provider]  TRELEGY ELLIPTA 200-62.5-25 MCG/INH AEPB Inhale 1 puff into the lungs daily. 02/28/21  Yes [provider]  valsartan (DIOVAN) 160 MG tablet Take 1 tablet (160 mg total) by mouth daily. 03/25/21 03/25/22 Yes Eugenie Filler, MD    Allergies    Azathioprine, Other, Aminoglycosides, Avelox [moxifloxacin hcl in  nacl], Beta adrenergic blockers, Calcium channel blockers, Cephalosporins, Ciprofloxacin, Clindamycin/lincomycin, Colistin, Fenofibrate, Fosamax [alendronate sodium], Imuran [azathioprine sodium], Iodinated diagnostic agents, Iodine, Macrolides and ketolides, Magnesium-containing compounds, Moxifloxacin, Niacin and related, Norfloxacin, Ofloxacin, Pefloxacin, Penicillamine, Prednisolone, Prednisone, Procainamide, Quinidine, Quinine derivatives, Succinylcholine, Tubocurarine, Vecuronium, and Vicodin [hydrocodone-acetaminophen]  Review of Systems   Review of Systems  Constitutional:  Negative for chills, decreased appetite, diaphoresis, fatigue and fever.  HENT:  Negative for congestion, dental problem, ear discharge, ear pain, facial swelling, hearing loss, nosebleeds, postnasal drip, rhinorrhea, sinus pain, sneezing, sore throat and trouble swallowing.   Eyes:  Negative for pain and visual disturbance.  Respiratory:  Negative for cough, chest tightness, shortness of breath, wheezing and stridor.   Cardiovascular:  Negative for chest pain, palpitations and leg swelling.  Gastrointestinal:  Negative for abdominal distention, abdominal pain, blood in stool, constipation, diarrhea, nausea and vomiting.  Endocrine: Negative for polydipsia and polyuria.  Genitourinary:  Negative for bladder incontinence, difficulty urinating, dysuria, flank pain, frequency, hematuria, urgency, vaginal bleeding and vaginal discharge.  Musculoskeletal:  Negative for myalgias, neck pain and neck stiffness.  Skin:  Negative for rash and wound.  Allergic/Immunologic: Negative for environmental allergies and food allergies.  Neurological:  Positive for weakness and numbness. Negative for dizziness, seizures, syncope, facial asymmetry, speech difficulty, light-headedness and headaches.       Aphasia  Psychiatric/Behavioral:  Positive for confusion. Negative for agitation, behavioral problems and hallucinations.    Physical  Exam Updated Vital Signs BP (!) 169/93   Pulse 80   Temp 99.6 F (37.6 C) (Rectal)   Resp 19   SpO2 98%   Physical Exam Vitals and nursing note reviewed.  Constitutional:      General: She is not in acute distress.    Appearance: Normal appearance. She is normal weight. She is not ill-appearing.  HENT:     Head: Normocephalic and atraumatic.     Right Ear: External ear normal.     Left Ear: External ear normal.     Nose: Nose normal. No congestion.     Mouth/Throat:     Mouth: Mucous membranes are moist.     Pharynx: Oropharynx is clear. No oropharyngeal exudate or posterior oropharyngeal erythema.  Eyes:     General: No  visual field deficit.    Extraocular Movements: Extraocular movements intact.     Conjunctiva/sclera: Conjunctivae normal.     Pupils: Pupils are equal, round, and reactive to light.  Neck:     Vascular: No carotid bruit.  Cardiovascular:     Rate and Rhythm: Normal rate and regular rhythm.     Pulses: Normal pulses.     Heart sounds: Normal heart sounds. No murmur heard. Pulmonary:     Effort: Pulmonary effort is normal. No respiratory distress.     Breath sounds: Normal breath sounds. No stridor. No wheezing, rhonchi or rales.  Chest:     Chest wall: No tenderness.  Abdominal:     General: Bowel sounds are normal. There is no distension.     Palpations: Abdomen is soft.     Tenderness: There is no abdominal tenderness. There is no right CVA tenderness, left CVA tenderness, guarding or rebound.  Musculoskeletal:        General: No swelling or tenderness. Normal range of motion.     Cervical back: Normal range of motion and neck supple. No rigidity, tenderness or bony tenderness.     Thoracic back: Normal. No tenderness or bony tenderness.     Lumbar back: Normal. No tenderness or bony tenderness.     Right lower leg: No edema.     Left lower leg: No edema.  Skin:    General: Skin is warm and dry.     Coloration: Skin is not jaundiced.   Neurological:     General: No focal deficit present.     Mental Status: She is confused.     Cranial Nerves: Dysarthria present. No cranial nerve deficit.     Sensory: Sensory deficit present.     Motor: Weakness present.     Comments: Right facial droop, right upper and lower extremity weakness, not alert and oriented.   Psychiatric:        Mood and Affect: Mood normal.        Behavior: Behavior normal.        Thought Content: Thought content normal.        Judgment: Judgment normal.    ED Results / Procedures / Treatments   Labs (all labs ordered are listed, but only abnormal results are displayed) Labs Reviewed  CBC - Abnormal; Notable for the following components:      Result Value   Platelets 504 (*)    All other components within normal limits  COMPREHENSIVE METABOLIC PANEL - Abnormal; Notable for the following components:   Sodium 132 (*)    Potassium 3.3 (*)    Glucose, Bld 114 (*)    BUN 6 (*)    Total Protein 5.6 (*)    Albumin 3.2 (*)    Alkaline Phosphatase 129 (*)    All other components within normal limits  RAPID URINE DRUG SCREEN, HOSP PERFORMED - Abnormal; Notable for the following components:   Benzodiazepines POSITIVE (*)    All other components within normal limits  URINALYSIS, ROUTINE W REFLEX MICROSCOPIC - Abnormal; Notable for the following components:   Color, Urine STRAW (*)    pH 9.0 (*)    Ketones, ur 5 (*)    All other components within normal limits  CBG MONITORING, ED - Abnormal; Notable for the following components:   Glucose-Capillary 116 (*)    All other components within normal limits  I-STAT CHEM 8, ED - Abnormal; Notable for the following components:   Sodium 132 (*)  Potassium 3.3 (*)    BUN 5 (*)    Glucose, Bld 114 (*)    Calcium, Ion 1.02 (*)    Hemoglobin 11.9 (*)    HCT 35.0 (*)    All other components within normal limits  RESP PANEL BY RT-PCR (FLU A&B, COVID) ARPGX2  PROTIME-INR  APTT  DIFFERENTIAL  ETHANOL   HEMOGLOBIN A1C  LIPID PANEL    EKG EKG Interpretation  Date/Time:  Saturday April 09 2021 20:57:48 EDT Ventricular Rate:  67 PR Interval:  225 QRS Duration: 89 QT Interval:  436 QTC Calculation: 461 R Axis:   41 Text Interpretation: Sinus rhythm Prolonged PR interval No significant change since last tracing Confirmed by Wandra Arthurs [phone]) on 04/09/2021 9:03:22 PM  Radiology MR BRAIN WO CONTRAST  Result Date: 04/10/2021 CLINICAL DATA:  Confusion and slurred speech EXAM: MRI HEAD WITHOUT CONTRAST TECHNIQUE: Multiplanar, multiecho pulse sequences of the brain and surrounding structures were obtained without intravenous contrast. COMPARISON:  None. FINDINGS: Brain: No acute infarct, mass effect or extra-axial collection. No acute or chronic hemorrhage. There is multifocal hyperintense T2-weighted signal within the white matter. Generalized volume loss without a clear lobar predilection. The midline structures are normal. Vascular: Major flow voids are preserved. Skull and upper cervical spine: Normal calvarium and skull base. Visualized upper cervical spine and soft tissues are normal. Sinuses/Orbits:No paranasal sinus fluid levels or advanced mucosal thickening. No mastoid or middle ear effusion. Normal orbits. IMPRESSION: 1. No acute intracranial abnormality. 2. Findings of chronic small vessel ischemia and generalized volume loss. Electronically Signed   By: Ulyses Jarred M.D.   On: 04/10/2021 00:18   CT HEAD CODE STROKE WO CONTRAST  Result Date: 04/09/2021 CLINICAL DATA:  Code stroke.  Acute neurologic deficit EXAM: CT HEAD WITHOUT CONTRAST TECHNIQUE: Contiguous axial images were obtained from the base of the skull through the vertex without intravenous contrast. COMPARISON:  None. FINDINGS: Brain: There is no mass, hemorrhage or extra-axial collection. There is generalized atrophy without lobar predilection. There is hypoattenuation of the periventricular white matter, most commonly  indicating chronic ischemic microangiopathy. Vascular: Aneurysm coils at the anterior communicating artery. Skull: The visualized skull base, calvarium and extracranial soft tissues are normal. Sinuses/Orbits: No fluid levels or advanced mucosal thickening of the visualized paranasal sinuses. No mastoid or middle ear effusion. The orbits are normal. ASPECTS [facility] Stroke Program Early CT Score) - Ganglionic level infarction (caudate, lentiform nuclei, internal capsule, insula, M1-M3 cortex): 7 - Supraganglionic infarction (M4-M6 cortex): 3 Total score (0-10 with 10 being normal): 10 IMPRESSION: 1. No acute intracranial abnormality. 2. ASPECTS is 10. These results were communicated to Dr. Roland Rack at 7:56 pm on 04/09/2021 by text page via the [facility] messaging system. Electronically Signed   By: Ulyses Jarred M.D.   On: 04/09/2021 19:55   CT ANGIO HEAD NECK W WO CM W PERF (CODE STROKE)  Result Date: 04/09/2021 CLINICAL DATA:  Stroke follow-up FINDINGS: CTA NECK FINDINGS SKELETON: There is no bony spinal canal stenosis. No lytic or blastic lesion. OTHER NECK: Normal pharynx, larynx and major salivary glands. No cervical lymphadenopathy. Unremarkable thyroid gland. UPPER CHEST: No pneumothorax or pleural effusion. No nodules or masses. AORTIC ARCH: There is calcific atherosclerosis of the aortic arch. There is no aneurysm, dissection or hemodynamically significant stenosis of the visualized portion of the aorta. Conventional 3 vessel aortic branching pattern. The visualized proximal subclavian arteries are widely patent. RIGHT CAROTID SYSTEM: Normal without aneurysm, dissection or stenosis. LEFT CAROTID SYSTEM: Normal  without aneurysm, dissection or stenosis. VERTEBRAL ARTERIES: Left dominant configuration. Both origins are clearly patent. There is no dissection, occlusion or flow-limiting stenosis to the skull base (V1-V3 segments). CTA HEAD FINDINGS POSTERIOR CIRCULATION: --Vertebral arteries: Normal V4  segments. --Inferior cerebellar arteries: Normal. --Basilar artery: Normal. --Superior cerebellar arteries: Normal. --Posterior cerebral arteries (PCA): Normal. ANTERIOR CIRCULATION: --Intracranial internal carotid arteries: Normal. --Anterior cerebral arteries (ACA): Diminutive right A1 segment. Coil mass at the anterior communicating artery. --Middle cerebral arteries (MCA): Normal. VENOUS SINUSES: As permitted by contrast timing, patent. ANATOMIC VARIANTS: None Review of the MIP images confirms the above findings. CT PERFUSION FINDINGS CBF <30%: 0 mL Tmax > 6.0s: 16 mL Mismatch volume: 16 mL Infarction location: No completed infarction. Areas of ischemia in the left occipital and posterior parietal lobes. IMPRESSION: 1. No emergent large vessel occlusion or high-grade stenosis of the intracranial arteries. 2. Coil mass at the anterior communicating artery. 3. 16 mL area of ischemia in the left occipital and posterior parietal lobes. These results were called by telephone at the time of interpretation on 04/09/2021 at 8:22 pm to provider MCNEILL [facility] , who verbally acknowledged these results. Aortic Atherosclerosis (ICD10-I70.0). Electronically Signed   By: Ulyses Jarred M.D.   On: 04/09/2021 20:23    Procedures Procedures   Medications Ordered in ED Medications  acetaminophen (TYLENOL) tablet 650 mg (has no administration in time range)    Or  acetaminophen (TYLENOL) 160 MG/5ML solution 650 mg (has no administration in time range)    Or  acetaminophen (TYLENOL) suppository 650 mg (has no administration in time range)  enoxaparin (LOVENOX) injection 40 mg (40 mg Subcutaneous Given 04/10/21 0153)  aspirin tablet 325 mg (has no administration in time range)  traZODone (DESYREL) tablet 75 mg (75 mg Oral Given 04/10/21 0148)  oxyCODONE-acetaminophen (PERCOCET/ROXICET) 5-325 MG per tablet 1 tablet (1 tablet Oral Given 04/09/21 2330)  mycophenolate (CELLCEPT) capsule 1,000 mg (1,000 mg Oral Given 04/10/21  0151)  ALPRAZolam Duanne Moron) tablet 0.5 mg (0.5 mg Oral Given 04/09/21 2331)  levothyroxine (SYNTHROID) tablet 75 mcg (has no administration in time range)  montelukast (SINGULAIR) tablet 10 mg (10 mg Oral Given 04/10/21 0150)  Fluticasone-Umeclidin-Vilant 200-62.5-25 MCG/INH AEPB 1 puff (has no administration in time range)  FLUoxetine (PROZAC) capsule 20 mg (has no administration in time range)  multivitamin with minerals tablet 1 tablet (has no administration in time range)  simethicone (MYLICON) chewable tablet 160 mg (has no administration in time range)  iohexol (OMNIPAQUE) 350 MG/ML injection 100 mL (100 mLs Intravenous Contrast Given 04/09/21 2010)  morphine 4 MG/ML injection 4 mg (4 mg Intravenous Given 04/09/21 2113)   stroke: mapping our early stages of recovery book (1 each Does not apply Given 04/10/21 0147)    ED Course  I have reviewed the triage vital signs and the nursing notes.  Pertinent labs & imaging results that were available during my care of the patient were reviewed by me and considered in my medical decision making (see chart for details).    MDM Rules/Calculators/A&P                         Sabrina [name] is a 80 y.o. female that presented as a code stroke. Neurology was at the bedside. She has right facial droop, right sided weakness and aphasia.   CTA of head and neck showed 16 mL area of ischemia in the left occipital and posterior parietal lobes.   Neurology recommending admission. Patient  admitted to hospitalist service.   Patient states compliance and understanding of the plan. I explained labs and imaging to the patient. No further questions at this time from the patient.   The plan for this patient was discussed with Dr. Darl Householder, who voiced agreement and who oversaw evaluation and treatment of this patient.   Final Clinical Impression(s) / ED Diagnoses Final diagnoses:  Right sided weakness  Aphasia    Rx / DC Orders ED Discharge Orders     None         Doretha Sou, MD 04/10/21 [id number]    Doretha Sou, MD 04/10/21 0157    Drenda Freeze, MD 04/10/21 [phone]

## 2021-04-10 ENCOUNTER — Other Ambulatory Visit (HOSPITAL_COMMUNITY): Payer: Medicare Other

## 2021-04-10 ENCOUNTER — Inpatient Hospital Stay (HOSPITAL_COMMUNITY): Payer: Medicare Other

## 2021-04-10 DIAGNOSIS — I639 Cerebral infarction, unspecified: Secondary | ICD-10-CM | POA: Diagnosis present

## 2021-04-10 LAB — LIPID PANEL
Cholesterol: 165 mg/dL (ref 0–200)
HDL: 41 mg/dL (ref >40)
LDL Cholesterol: 92 mg/dL (ref 0–99)
Total CHOL/HDL Ratio: 4 RATIO
Triglycerides: 159 mg/dL — ABNORMAL HIGH (ref <150)
VLDL: 32 mg/dL (ref 0–40)

## 2021-04-10 LAB — BASIC METABOLIC PANEL
Anion gap: 9 (ref 5–15)
BUN: 6 mg/dL — ABNORMAL LOW (ref 8–23)
CO2: 22 mmol/L (ref 22–32)
Calcium: 9.4 mg/dL (ref 8.9–10.3)
Chloride: 102 mmol/L (ref 98–111)
Creatinine, Ser: 0.66 mg/dL (ref 0.44–1.00)
GFR, Estimated: >60 mL/min (ref >60)
Glucose, Bld: 182 mg/dL — ABNORMAL HIGH (ref 70–99)
Potassium: 3.8 mmol/L (ref 3.5–5.1)
Sodium: 133 mmol/L — ABNORMAL LOW (ref 135–145)

## 2021-04-10 LAB — CBG MONITORING, ED: Glucose-Capillary: 156 mg/dL — ABNORMAL HIGH (ref 70–99)

## 2021-04-10 LAB — MRSA NEXT GEN BY PCR, NASAL: MRSA by PCR Next Gen: NOT DETECTED

## 2021-04-10 LAB — HEMOGLOBIN A1C
Hgb A1c MFr Bld: 5.2 % (ref 4.8–5.6)
Mean Plasma Glucose: 102.54 mg/dL

## 2021-04-10 LAB — MAGNESIUM: Magnesium: 1.9 mg/dL (ref 1.7–2.4)

## 2021-04-10 LAB — ETHANOL: Alcohol, Ethyl (B): <10 mg/dL (ref <10)

## 2021-04-10 MED ORDER — ASPIRIN EC 81 MG PO TBEC
81.0000 mg | DELAYED_RELEASE_TABLET | Freq: Every day | ORAL | Status: DC
  Administered 2021-04-10: 81 mg via ORAL
  Filled 2021-04-10: qty 1

## 2021-04-10 MED ORDER — UMECLIDINIUM BROMIDE 62.5 MCG/INH IN AEPB
1.0000 | INHALATION_SPRAY | Freq: Every day | RESPIRATORY_TRACT | Status: DC
  Administered 2021-04-11 – 2021-04-12 (×2): 1 via RESPIRATORY_TRACT
  Filled 2021-04-10: qty 7

## 2021-04-10 MED ORDER — ONDANSETRON HCL 4 MG/2ML IJ SOLN
4.0000 mg | Freq: Four times a day (QID) | INTRAMUSCULAR | Status: DC | PRN
  Filled 2021-04-10: qty 2

## 2021-04-10 MED ORDER — SENNOSIDES-DOCUSATE SODIUM 8.6-50 MG PO TABS: 1.0000 | ORAL_TABLET | Freq: Every evening | ORAL | Status: DC | PRN

## 2021-04-10 MED ORDER — ACETAMINOPHEN 160 MG/5ML PO SOLN: 650.0000 mg | ORAL | Status: DC | PRN

## 2021-04-10 MED ORDER — FLUTICASONE FUROATE-VILANTEROL 100-25 MCG/INH IN AEPB
1.0000 | INHALATION_SPRAY | Freq: Every day | RESPIRATORY_TRACT | Status: DC
  Administered 2021-04-11 – 2021-04-12 (×2): 1 via RESPIRATORY_TRACT
  Filled 2021-04-10: qty 28

## 2021-04-10 MED ORDER — SODIUM CHLORIDE 0.9 % IV SOLN: INTRAVENOUS | Status: DC

## 2021-04-10 MED ORDER — PANTOPRAZOLE SODIUM 40 MG IV SOLR: 40.0000 mg | Freq: Every day | INTRAVENOUS | Status: DC

## 2021-04-10 MED ORDER — CLOPIDOGREL BISULFATE 75 MG PO TABS
75.0000 mg | ORAL_TABLET | Freq: Every day | ORAL | Status: DC
  Administered 2021-04-10: 75 mg via ORAL
  Filled 2021-04-10: qty 1

## 2021-04-10 MED ORDER — PANTOPRAZOLE SODIUM 40 MG PO TBEC
40.0000 mg | DELAYED_RELEASE_TABLET | Freq: Every day | ORAL | Status: DC
  Administered 2021-04-10 – 2021-04-12 (×3): 40 mg via ORAL
  Filled 2021-04-10 (×3): qty 1

## 2021-04-10 MED ORDER — STROKE: EARLY STAGES OF RECOVERY BOOK
Freq: Once | Status: AC
  Filled 2021-04-10 (×2): qty 1

## 2021-04-10 MED ORDER — ACETAMINOPHEN 650 MG RE SUPP: 650.0000 mg | RECTAL | Status: DC | PRN

## 2021-04-10 MED ORDER — SODIUM CHLORIDE 0.9 % IV SOLN
50.0000 mL | Freq: Once | INTRAVENOUS | Status: AC
  Administered 2021-04-10: 50 mL via INTRAVENOUS

## 2021-04-10 MED ORDER — IOHEXOL 350 MG/ML SOLN
100.0000 mL | Freq: Once | INTRAVENOUS | Status: AC | PRN
  Administered 2021-04-10: 100 mL via INTRAVENOUS

## 2021-04-10 MED ORDER — HYDRALAZINE HCL 20 MG/ML IJ SOLN
10.0000 mg | Freq: Once | INTRAMUSCULAR | Status: AC
  Administered 2021-04-10: 10 mg via INTRAVENOUS

## 2021-04-10 MED ORDER — ACETAMINOPHEN 325 MG PO TABS
650.0000 mg | ORAL_TABLET | ORAL | Status: DC | PRN
  Administered 2021-04-10: 650 mg via ORAL
  Filled 2021-04-10: qty 2

## 2021-04-10 MED ORDER — CHLORHEXIDINE GLUCONATE CLOTH 2 % EX PADS
6.0000 | MEDICATED_PAD | Freq: Every day | CUTANEOUS | Status: DC
  Administered 2021-04-11 – 2021-04-12 (×2): 6 via TOPICAL

## 2021-04-10 MED ORDER — ALTEPLASE (STROKE) FULL DOSE INFUSION
0.9000 mg/kg | Freq: Once | INTRAVENOUS | Status: AC
  Administered 2021-04-10: 70.4 mg via INTRAVENOUS

## 2021-04-10 MED ORDER — ATORVASTATIN CALCIUM 10 MG PO TABS
20.0000 mg | ORAL_TABLET | Freq: Every day | ORAL | Status: DC
  Administered 2021-04-10 – 2021-04-12 (×3): 20 mg via ORAL
  Filled 2021-04-10 (×3): qty 2

## 2021-04-10 NOTE — Progress Notes (Signed)
EEG completed, results pending. 

## 2021-04-10 NOTE — Progress Notes (Signed)
PHARMACIST CODE STROKE RESPONSE  Notified to mix tPA at 1433 by Dr. Erlinda Hong Delivered tPA to RN at 1436  tPA dose = 7 mg bolus over 1 minute followed by 63.4 mg for a total dose of 70.4 mg over 1 hour  Joetta Manners, PharmD, [facility] Emergency Medicine Clinical Pharmacist ED RPh Phone: [place]: [phone]

## 2021-04-10 NOTE — Procedures (Signed)
Routine EEG Report  Sabrina [name] is a 80 y.o. female with a history of aphasia who is undergoing an EEG to evaluate for seizures.  Report: This EEG was acquired with electrodes placed according to the International 10-20 electrode system (including Fp1, Fp2, F3, F4, C3, C4, P3, P4, O1, O2, T3, T4, T5, T6, A1, A2, Fz, Cz, Pz). The following electrodes were missing or displaced: none.  The occipital dominant rhythm was 9 Hz. This activity is reactive to stimulation. Drowsiness was manifested by background fragmentation; deeper stages of sleep were not identified. There was no focal slowing. There were no interictal epileptiform discharges. There were no electrographic seizures identified. Photic stimulation and hyperventilation were not performed.  Impression: This EEG was obtained while awake and drowsy and is normal.    Clinical Correlation: Normal EEGs, however, do not rule out epilepsy.  Su Monks, MD [facility] [phone]  If 7pm- 7am, please page neurology on call as listed in [place].

## 2021-04-10 NOTE — Progress Notes (Signed)
History of myasthenia gravis on CellCept, history of cerebral aneurysm status post coiling ,hypertension, hypothyroidism ,recent discharge from hospital on 7/22 after right hip periprosthetic fracture, nonoperative treatment with no weightbearing right hip 4 to 6 weeks, who was sent from rehab to ED due to altered mental status/dysarthria, right hemianopia ,CT angio showed "16 mL area of ischemia in the left occipital and posterior parietal lobes" Neurology consulted, she developed another acute neurological symptoms concerning for acute CVA, neurology decided to give tpa, care transferred to neuro icu, hospitalist sign off

## 2021-04-10 NOTE — Consult Note (Signed)
Neurology Consultation Reason for Consult: Speech difficulty Referring Physician: Darl Householder, D  CC: Speech difficulty  History is obtained from: Patient, daughter  HPI: Sabrina [name] is a 80 y.o. female with a history of hypertension, hyperlipidemia who was in her normal state of health earlier today.  She spoke with someone around 2 PM and at that time she was already having some difficulty with speaking.  She subsequently called her daughter around 5:45 PM and asked her to come over because she stated that something was "not right."  Her daughter found her to have significant difficulty speaking, and it is at that point that she was brought into the emergency department.  After evaluation by Dr. Darl Householder, he determined that she appeared aphasic and activated a code stroke.     LKW: Unclear, abnormal at 2 PM tpa given?: no, outside of window   ROS: Unable to obtain due to altered mental status.   Past Medical History:  Diagnosis Date   Anxiety    Arthritis    Asthma    Avascular necrosis (HCC)    Bradycardia    symptomatic   Cancer (HCC)    COUPLE OF SKIN CANCERS   Carpal tunnel syndrome    LEFT --NUMBNESS   Cataracts, bilateral    Cerebral aneurysm    Complication of anesthesia    BP FALLS/"ANAPHYLACTIC SHOCK"/ EXTREME TREMORS;  DID  FINE WITH SURGERIES APRIL AND OCT 2013 AT [facility] SURGERIES WERE SHORT PROCEDURES--PROBLEMS WITH ANESTHESIA SEEM TO OCCUR WITH THE LONGER PROCEDURES.THE TREMORS WERE AFTER HIP REPLACEMNT 22011-ARMS WERE FLAILING--PT STATES IT TOOK 4 DOSES OF DEMEROL BEFORE THE TREMORS STOPPED.   Depression    History of skin cancer    HSV (herpes simplex virus) infection    CHEST   Hyperlipidemia    Hypertension    PT HAS LOST WEIGHT--B/P NOW WITHIN NORMALS--IS ON B/P MEDICINE   Hypertriglyceridemia    Hypothyroidism    Myasthenia gravis    DOING WELL ON CELLCEPT-FOLLOWED BY DR. LISA HOBSON - [facility] NEUROLOGIST   OA (osteoarthritis) of hip    Osteopenia     Osteoporosis    Pain    LEFT SHOULDER PAIN-TORN ROTATOR CUFF-VERY LIMITED ROM   Pain    TOP OF RIGHT SHOULDER--ROM OK AT PRESENT   Pneumonia    Rash, skin    LOWER ABDOMEN-FUNGAL INFECTION-PT HAS TOPICAL OINTMENT TO USE AS NEEDED.   Seasonal allergies    Thyroid disease      Family History  Problem Relation Age of Onset   Hypertension Mother    Heart disease Mother        heart attack   Stroke Father    Heart disease Father        heart attack   Breast cancer Neg Hx      Social History:  reports that she has never smoked. She has never used smokeless tobacco. She reports current alcohol use. She reports that she does not use drugs.   Exam: Current vital signs: BP (!) 178/62   Pulse 71   Temp 99.6 F (37.6 C) (Rectal)   Resp (!) 21   SpO2 97%  Vital signs in last 24 hours: Temp:  [99.6 F (37.6 C)] 99.6 F (37.6 C) (08/06 2048) Pulse Rate:  [69-71] 71 (08/06 2100) Resp:  [17-23] 21 (08/06 2100) BP: (96-178)/(62-115) 178/62 (08/06 2100) SpO2:  [97 %-99 %] 97 % (08/06 2100)   Physical Exam  Constitutional: Appears well-developed and well-nourished.  Psych: Affect appropriate  to situation Eyes: No scleral injection HENT: No OP obstruction MSK: no joint deformities.  Cardiovascular: Normal rate and regular rhythm.  Respiratory: Effort normal, non-labored breathing GI: Soft.  No distension. There is no tenderness.  Skin: WDI  Neuro: Mental Status: Patient is awake, alert, she has significant aphasia with perseveration.  She is able to tell me her name, and follows some simple commands but not all. Cranial Nerves: II: She has a dense right hemianopia. Pupils are equal, round, and reactive to light.   III,IV, VI: EOMI without ptosis or diploplia.  V: Facial sensation is symmetric to temperature VII: Facial movement is symmetric.  VIII: hearing is intact to voice X: Uvula elevates symmetrically XI: Shoulder shrug is symmetric. XII: tongue is midline without  atrophy or fasciculations.  Motor: Tone is normal. Bulk is normal. 5/5 strength was present in left arm and leg, her right arm has mild drift, 4+/5 strength, right leg is limited due to recent hip fracture, but he is able to resist gravity some Sensory: Sensation is symmetric to light touch and temperature  Cerebellar: She does not comply with formal testing, but no clear ataxia     I have reviewed labs in epic and the results pertinent to this consultation are: Mild hyponatremia at 132 and hypokalemia at 3.3   I have reviewed the images obtained: CT/CTA-no LVO, CTP perfusion-hypoperfusion in the left posterior quadrant.  There is an unusual pattern of ischemia on CT perfusion but I wonder if this is due to some proximal PCA stenosis.  Impression: 80 year old female with new onset aphasia and visual cut with perfusion deficit on CTP consistent with a left MCA branch infarct.  She will need to be admitted for secondary risk factor modification.  Recommendations: - HgbA1c, fasting lipid panel - MRI  of the brain without contrast - Frequent neuro checks - Echocardiogram - Prophylactic therapy-Antiplatelet med: Aspirin - dose '325mg'$  PO or '300mg'$  PR - Risk factor modification - Telemetry monitoring - PT consult, OT consult, Speech consult - Stroke team to follow   Roland Rack, MD [facility] [phone]  If 7pm- 7am, please page neurology on call as listed in [place].

## 2021-04-10 NOTE — Evaluation (Signed)
Speech Language Pathology Evaluation Patient Details Name: Sabrina [name] MRN: [id number] DOB: Mar 15, 1941 Today's Date: 04/10/2021 Time: 0945-1000 SLP Time Calculation (min) (ACUTE ONLY): 15 min  Problem List:  Patient Active Problem List   Diagnosis Date Noted   Acute ischemic stroke ([place]) 04/09/2021   Fracture, proximal femur, right, closed, initial encounter ([place])    Hyponatremia 03/24/2021   Hypokalemia 03/24/2021   Anxiety 03/24/2021   Hip fracture ([place]) 03/23/2021   Unilateral primary osteoarthritis, left knee 11/26/2017   Lumbar stenosis 06/18/2015   Essential hypertension 02/20/2014   Bradycardia 02/20/2014   Swelling of left lower extremity 02/20/2014   Rotator cuff arthropathy 10/16/2012   Carpal tunnel syndrome 10/16/2012   Carpal tunnel syndrome of right wrist 05/31/2012   Rotator cuff impingement syndrome 12/22/2011   Irreducible incisional hernia 03/15/2011   Myasthenia gravis, 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[place]) 12/03/2004   Past Medical History:  Past Medical History:  Diagnosis Date   Anxiety    Arthritis    Asthma    Avascular necrosis (HCC)    Bradycardia    symptomatic   Cancer ([place])    COUPLE OF SKIN CANCERS   Carpal tunnel syndrome    LEFT --NUMBNESS   Cataracts, bilateral    Cerebral aneurysm    Complication of anesthesia    BP FALLS/"ANAPHYLACTIC SHOCK"/ EXTREME TREMORS;  DID  FINE WITH SURGERIES APRIL AND OCT 2013 AT [facility]--THE SURGERIES WERE SHORT PROCEDURES--PROBLEMS WITH ANESTHESIA SEEM TO OCCUR WITH THE LONGER PROCEDURES.THE TREMORS WERE AFTER HIP REPLACEMNT 22011-ARMS WERE FLAILING--PT STATES IT TOOK 4 DOSES OF DEMEROL BEFORE THE TREMORS STOPPED.   Depression    History of skin cancer    HSV (herpes simplex virus) infection    CHEST   Hyperlipidemia    Hypertension    PT HAS LOST WEIGHT--B/P NOW WITHIN NORMALS--IS ON B/P MEDICINE   Hypertriglyceridemia    Hypothyroidism    Myasthenia gravis    DOING WELL ON CELLCEPT-FOLLOWED BY DR. LISA  HOBSON - [facility] NEUROLOGIST   OA (osteoarthritis) of hip    Osteopenia    Osteoporosis    Pain    LEFT SHOULDER PAIN-TORN ROTATOR CUFF-VERY LIMITED ROM   Pain    TOP OF RIGHT SHOULDER--ROM OK AT PRESENT   Pneumonia    Rash, skin    LOWER ABDOMEN-FUNGAL INFECTION-PT HAS TOPICAL OINTMENT TO USE AS NEEDED.   Seasonal allergies    Thyroid disease    Past Surgical History:  Past Surgical History:  Procedure Laterality Date   BELPHAROPTOSIS REPAIR  01/2006   PTOSIS REPAIR BILATERAL   CARPAL TUNNEL RELEASE  05/31/2012   Procedure: CARPAL TUNNEL RELEASE;  Surgeon: Mcarthur Rossetti, MD;  Location: [facility];  Service: Orthopedics;  Laterality: Left;  Left Open Carpal Tunnel Release   CARPAL TUNNEL RELEASE  10/10/2012   Procedure: CARPAL TUNNEL RELEASE;  Surgeon: Nita Sells, MD;  Location: [facility];  Service: Orthopedics;  Laterality: Left;   CEREBRAL ANEURYSM REPAIR  10/2005   GUGLIEMI COILS TO REPAIR ANEURYSM; NO RESIDUAL PROBLEMS--HAS FOLLOW UP MRI EVERY 1 OR 2 YRS   CHOLECYSTECTOMY     EXTREME DROP IN BLOOD PRESSURE WITH THIS SURGERY- YRS AGO- PT WAS 80 YRS OLD   JOINT REPLACEMENT  10/19/09   RT HIP--SEVERE TREMORS, FLAILING OF ARMS AFTER THIS SURGERY.   left shoulder arthroscopy      REVERSE SHOULDER ARTHROPLASTY  10/10/2012   Procedure: REVERSE SHOULDER ARTHROPLASTY;  Surgeon: Nita Sells, MD;  Location: [facility];  Service:  Orthopedics;  Laterality: Left;   TONSILLECTOMY AND ADENOIDECTOMY     TOTAL HIP ARTHROPLASTY     right   TUBAL LIGATION     ANAPHYLACTIC SHOCK AFTER THIS SURGERY-CAUSE THOUGHT TO  BE RELATED TO SODIUM PENTOTHAL   HPI:  Sabrina [name] is a 80 y.o. female with medical history significant of Myasthenia Gravis, HTN, HLD, cerebral aneurysm s/p coiling.  Pt recently had R hip Fx and surgical repair 7/20-7/22.  Pt discharged to Rehab.  However, prior to this she was living at home with her daughter doing some basic house chores, managing medications/bills  and cooking simple meals.  She reported due to knee arthritis that standing for long periods of time was difficult for her and that she used either a walker or can for ambulation.  Pt family arrived to Rehab and found her with AMS, She was unable to recognize them.  EMS was called and pt brought to ED as a code stroke.  Given concern for possible CVA MRI of the brain was completed and showing no acute intracranial abnormality.   Assessment / Plan / Recommendation Clinical Impression  Cognitive/linguistic evaluation and motor speech screen were completed.  Cranial nerve exam was completed and unremarkable.  Lingual, labial, facial and jaw range of motion and strength were adequate.  Facial sensation appeared to be intact and she did not endorse a difference in sensation from the right to left side of her face.  Speech was clear and easy to understand.  No discernible apraxia or dysarthria noted.  She achieved and overall score of 26/30 on the Mini Mental State Exam.  Her cognitive skills appeared to be grossly intact.  She was fully oriented to person, place and situation.  She was partially oriented to time knowing the year, month and day of the week but not the date.  Her immediate recall of 3 novel words was good.  However, given a short delay she was only able to independently recall 2/3 words.  Semantic cue facilitated recall of third novel words.  Attention to task was good.  She was noted to start the task twice incorrectly but she was able to self correct and complete it with 100% accuracy.  She was able to provide logical solutions to simple problems and complete the clock drawing task.  Basic language skills were grossly intact.  She was able to name objects, repeat a short phrase, follow a three step command, write a sentence and read/comprehend a sentence.  However, she was noted to have word finding issues at the conversational level for things like names of medications and medical diagnoses (eg:  arthritis).  She appeared bothered by this episodes.  She was a good Primary school teacher and often found ways around these episodes of word finding issues.  She would benefit from ongoing ST to address these mild higher level issues and would likely benefit from more in depth assessment.    SLP Assessment  SLP Recommendation/Assessment: Patient needs continued Speech Lanaguage Pathology Services SLP Visit Diagnosis: Aphasia (R47.01)    Follow Up Recommendations  Other (comment) (continued ST at next level of care)    Frequency and Duration min 1 x/week  2 weeks      SLP Evaluation Cognition  Overall Cognitive Status: Within Functional Limits for tasks assessed Arousal/Alertness: Awake/alert Orientation Level: Oriented to person;Oriented to place;Oriented to situation;Disoriented to time Attention: Focused Focused Attention: Appears intact Memory: Impaired Memory Impairment: Decreased recall of new information Awareness: Appears intact Problem Solving:  Appears intact       Comprehension  Auditory Comprehension Overall Auditory Comprehension: Appears within functional limits for tasks assessed Yes/No Questions: Not tested Commands: Within Functional Limits Conversation: Simple Reading Comprehension Reading Status: Within funtional limits    Expression Expression Primary Mode of Expression: Verbal Verbal Expression Overall Verbal Expression: Impaired Initiation: No impairment Automatic Speech: Name;Social Response Level of Generative/Spontaneous Verbalization: Sentence;Conversation Repetition: No impairment Naming: No impairment Pragmatics: No impairment Non-Verbal Means of Communication: Not applicable Written Expression Dominant Hand: Right Written Expression: Within Functional Limits   Oral / Motor  Oral Motor/Sensory Function Overall Oral Motor/Sensory Function: Within functional limits Motor Speech Overall Motor Speech: Appears within functional limits for  tasks assessed Respiration: Within functional limits Phonation: Normal Resonance: Within functional limits Articulation: Within functional limitis Intelligibility: Intelligible Motor Planning: Witnin functional limits Motor Speech Errors: Not applicable   GO                   Shelly Flatten, MA, CCC-SLP Acute Rehab SLP [phone]  Lamar Sprinkles 04/10/2021, 10:50 AM

## 2021-04-10 NOTE — Evaluation (Signed)
Physical Therapy Evaluation Patient Details Name: Sabrina [name] MRN: [id number] DOB: [birth year] Today's Date: 04/10/2021   History of Present Illness  Pt is an 80 y.o. female who presented 04/09/21 with AMS, visual cut, and aphasia. Pt recently had R hip Fx and surgical repair 7/20-7/22.  Pt discharged to Rehab. CT:16 mL chronic area of ischemia in the left occipital and posterior parietal lobes. MRI: no new areas of infarct. PMH: Myasthenia Gravis, HTN, HLD, cerebral aneurysm s/p coiling.   Clinical Impression  Pt presents with condition above and deficits mentioned below, see PT Problem List. Prior to her recent R hip fx, she was ambulating with a rollator. Recently, she has been working on transfers with PT at her SNF using a RW. Currently, pt displays R lower extremity weakness and ROM deficits that are probably her recent baseline since her fx. Pt with balance and activity tolerance deficits. Pt is primarily limited in mobility by being NWB on her R leg. Expect pt will progress well once she is able to bear weight on her R leg. Currently, she is requiring minA for transfers using a RW. Will continue to follow acutely. Recommend pt returns to her SNF to continue therapy.     Follow Up Recommendations SNF;Supervision/Assistance - 24 hour    Equipment Recommendations  None recommended by PT    Recommendations for Other Services       Precautions / Restrictions Precautions Precautions: Fall Restrictions Weight Bearing Restrictions: Yes RLE Weight Bearing: Non weight bearing      Mobility  Bed Mobility Overal bed mobility: Needs Assistance Bed Mobility: Supine to Sit     Supine to sit: Min guard;HOB elevated     General bed mobility comments: no rail, up to side of stretcher without assistance, min guard for safety    Transfers Overall transfer level: Needs assistance Equipment used: Rolling walker (2 wheeled) Transfers: Sit to/from [facility] Sit to Stand:  Min assist;+2 safety/equipment Stand pivot transfers: Min assist;+2 safety/equipment       General transfer comment: Pt able to maintain [place] RLE with sit<>stand and stand pivot, minA to steady, + 2 for safety.  Ambulation/Gait             General Gait Details: Unable at this time, NWB R leg.  Stairs            Wheelchair Mobility    Modified Rankin (Stroke Patients Only) Modified Rankin (Stroke Patients Only) Pre-Morbid Rankin Score: Severe disability Modified Rankin: Severe disability     Balance Overall balance assessment: Needs assistance Sitting-balance support: No upper extremity supported;Feet supported Sitting balance-Leahy Scale: Good     Standing balance support: Bilateral upper extremity supported Standing balance-Leahy Scale: Poor Standing balance comment: requires RW and additional external A of therapist                             Pertinent Vitals/Pain Pain Assessment: Faces Faces Pain Scale: Hurts a little bit Pain Location: R leg Pain Descriptors / Indicators: Discomfort;Grimacing;Guarding Pain Intervention(s): Monitored during session;Limited activity within patient's tolerance;Repositioned    Home Living Family/patient expects to be discharged to:: Skilled nursing facility   Available Help at Discharge: Family;Available 24 hours/day Type of Home: House                Prior Function Level of Independence: Needs assistance   Gait / Transfers Assistance Needed: Prior to fall with resulant hip fracture she walked  with a rollator, helped with dishes and laundry and helped make beds. Recently, has been working with PT at [facility] for standing and pivoting with RW.  ADL's / Homemaking Assistance Needed: daughter assists with shower transfer, sits on 3 in 1 in shower        Hand Dominance   Dominant Hand: Right    Extremity/Trunk Assessment   Upper Extremity Assessment Upper Extremity Assessment: Defer to OT  evaluation    Lower Extremity Assessment Lower Extremity Assessment: RLE deficits/detail RLE Deficits / Details: able to lift leg against gravity for mobility but generally weak; recent fx, NWB RLE Coordination: decreased Altieri motor    Cervical / Trunk Assessment Cervical / Trunk Assessment: Normal  Communication   Communication: No difficulties  Cognition Arousal/Alertness: Awake/alert Behavior During Therapy: WFL for tasks assessed/performed Overall Cognitive Status: Within Functional Limits for tasks assessed                                        General Comments      Exercises     Assessment/Plan    PT Assessment Patient needs continued PT services  PT Problem List Decreased strength;Decreased mobility;Decreased activity tolerance;Decreased balance;Decreased knowledge of use of DME;Pain;Decreased range of motion;Decreased coordination       PT Treatment Interventions Therapeutic activities;Gait training;Therapeutic exercise;Balance training;Functional mobility training;Patient/family education;DME instruction;Neuromuscular re-education    PT Goals (Current goals can be found in the Care Plan section)  Acute Rehab PT Goals Patient Stated Goal: to go back to rehab PT Goal Formulation: With patient/family Time For Goal Achievement: 04/24/21 Potential to Achieve Goals: Good    Frequency Min 3X/week   Barriers to discharge        Co-evaluation PT/OT/SLP Co-Evaluation/Treatment: Yes Reason for Co-Treatment: For patient/therapist safety;To address functional/ADL transfers PT goals addressed during session: Mobility/safety with mobility;Balance;Proper use of DME OT goals addressed during session: Strengthening/ROM;ADL's and self-care       AM-PAC PT "6 Clicks" Mobility  Outcome Measure Help needed turning from your back to your side while in a flat bed without using bedrails?: A Little Help needed moving from lying on your back to sitting on the  side of a flat bed without using bedrails?: A Little Help needed moving to and from a bed to a chair (including a wheelchair)?: A Little Help needed standing up from a chair using your arms (e.g., wheelchair or bedside chair)?: A Little Help needed to walk in hospital room?: Total Help needed climbing 3-5 steps with a railing? : Total 6 Click Score: 14    End of Session Equipment Utilized During Treatment: Gait belt Activity Tolerance: Patient tolerated treatment well Patient left: in chair;with call bell/phone within reach;with family/visitor present Nurse Communication: Mobility status PT Visit Diagnosis: Difficulty in walking, not elsewhere classified (R26.2);Pain;History of falling (Z91.81);Muscle weakness (generalized) (M62.81);Unsteadiness on feet (R26.81);Other abnormalities of gait and mobility (R26.89) Pain - Right/Left: Right Pain - part of body: Hip    Time: [id number] PT Time Calculation (min) (ACUTE ONLY): 16 min   Charges:   PT Evaluation $PT Eval Moderate Complexity: 1 Mod          Moishe Spice, PT, DPT Acute Rehabilitation Services  Pager: [phone] Office: [phone]   Orvan Falconer 04/10/2021, 2:19 PM

## 2021-04-10 NOTE — ED Notes (Signed)
I was called to the patients room by patients daughter stating that her mother had another episode of not being able to speak. I went to room immediately and found patient being assisted back to bed from chair. Patient alert and oriented. Patient with clear speech and reports headache  Left frontal area. No neuro deficits noted.  Patient with vitals sign recheck and note modified NIHSS

## 2021-04-10 NOTE — ED Notes (Signed)
Decision to give TPA

## 2021-04-10 NOTE — Progress Notes (Signed)
STROKE TEAM PROGRESS NOTE   INTERVAL HISTORY Around 2pm, called by RN that pt had again acute onset aphasia. Talked with RN and pt daughter, pt apparently doing well this am, no aphasia, still has mild right LE weakness due to right hip fracture 2 weeks ago. However, around 1:30pm, RN checked on pt and NIHSS = 1 for right LE. Around 2pm, pt started to complaining left sided HA and then speech became garbled and incoherent. Code stroke activated and CT head no acute change. On exam, pt had paraphasic errors, mild right facial droop. Discussed with daughter regarding tPA benefit and risk, she agree to proceed with tPA. SBP 183 at that time, she received '10mg'$  hydralazine before tPA.   Vitals:   04/10/21 1322 04/10/21 1400 04/10/21 1436 04/10/21 1439  BP: (!) 149/89 (!) 148/68 (!) 183/61 (!) 177/62  Pulse: 75 69 65 66  Resp: 19   13  Temp: (!) 97.2 F (36.2 C)     TempSrc: Oral     SpO2: 100% 99% 99% 100%  Weight:  78.2 kg     CBC:  Recent Labs  Lab 04/09/21 2022 04/09/21 2042  WBC 5.7  --   NEUTROABS 3.9  --   HGB 12.3 11.9*  HCT 37.2 35.0*  MCV 87.5  --   PLT 504*  --    Basic Metabolic Panel:  Recent Labs  Lab 04/09/21 2022 04/09/21 2042 04/10/21 0937  NA 132* 132* 133*  K 3.3* 3.3* 3.8  CL 101 100 102  CO2 22  --  22  GLUCOSE 114* 114* 182*  BUN 6* 5* 6*  CREATININE 0.73 0.60 0.66  CALCIUM 9.2  --  9.4  MG  --   --  1.9   Lipid Panel:  Recent Labs  Lab 04/10/21 0438  CHOL 165  TRIG 159*  HDL 41  CHOLHDL 4.0  VLDL 32  LDLCALC 92   HgbA1c:  Recent Labs  Lab 04/10/21 0438  HGBA1C 5.2   Urine Drug Screen:  Recent Labs  Lab 04/09/21 2051  LABOPIA NONE DETECTED  COCAINSCRNUR NONE DETECTED  LABBENZ POSITIVE*  AMPHETMU NONE DETECTED  THCU NONE DETECTED  LABBARB NONE DETECTED    Alcohol Level  Recent Labs  Lab 04/10/21 0438  ETH <10    IMAGING past 24 hours MR BRAIN WO CONTRAST  Result Date: 04/10/2021 CLINICAL DATA:  Confusion and slurred  speech EXAM: MRI HEAD WITHOUT CONTRAST TECHNIQUE: Multiplanar, multiecho pulse sequences of the brain and surrounding structures were obtained without intravenous contrast. COMPARISON:  None. FINDINGS: Brain: No acute infarct, mass effect or extra-axial collection. No acute or chronic hemorrhage. There is multifocal hyperintense T2-weighted signal within the white matter. Generalized volume loss without a clear lobar predilection. The midline structures are normal. Vascular: Major flow voids are preserved. Skull and upper cervical spine: Normal calvarium and skull base. Visualized upper cervical spine and soft tissues are normal. Sinuses/Orbits:No paranasal sinus fluid levels or advanced mucosal thickening. No mastoid or middle ear effusion. Normal orbits. IMPRESSION: 1. No acute intracranial abnormality. 2. Findings of chronic small vessel ischemia and generalized volume loss. Electronically Signed   By: Ulyses Jarred M.D.   On: 04/10/2021 00:18   CT HEAD CODE STROKE WO CONTRAST  Result Date: 04/10/2021 CLINICAL DATA:  Code stroke. Neuro deficit, acute, stroke suspected. Acute onset left-sided headache with aphasia. EXAM: CT HEAD WITHOUT CONTRAST TECHNIQUE: Contiguous axial images were obtained from the base of the skull through the vertex without intravenous contrast.  COMPARISON:  Head CT 04/09/2021 and MRI 04/10/2021 FINDINGS: Brain: Streak artifact from embolization coils obscures portions of the temporal and inferior frontal lobes. Within this limitation, no acute infarct, intracranial hemorrhage, mass, midline shift, or extra-axial fluid collection is identified. Mild cerebral atrophy is within normal limits for age. Mild hypoattenuation in the cerebral white matter is unchanged and nonspecific but compatible with chronic small vessel ischemia. Vascular: Embolization coils in the anterior communicating region. No hyperdense vessel. Skull: No fracture or suspicious osseous lesion. Sinuses/Orbits: Visualized  paranasal sinuses and mastoid air cells are clear. Bilateral cataract extraction. Other: None. ASPECTS [facility] Stroke Program Early CT Score) - Ganglionic level infarction (caudate, lentiform nuclei, internal capsule, insula, M1-M3 cortex): 7 - Supraganglionic infarction (M4-M6 cortex): 3 Total score (0-10 with 10 being normal): 10 IMPRESSION: 1. No evidence of acute intracranial abnormality. 2. ASPECTS is 10. These results were communicated to Dr. Erlinda Hong at 2:30 pm on 04/10/2021 by text page via the [facility] messaging system. Electronically Signed   By: Logan Bores M.D.   On: 04/10/2021 14:30   CT HEAD CODE STROKE WO CONTRAST  Result Date: 04/09/2021 CLINICAL DATA:  Code stroke.  Acute neurologic deficit EXAM: CT HEAD WITHOUT CONTRAST TECHNIQUE: Contiguous axial images were obtained from the base of the skull through the vertex without intravenous contrast. COMPARISON:  None. FINDINGS: Brain: There is no mass, hemorrhage or extra-axial collection. There is generalized atrophy without lobar predilection. There is hypoattenuation of the periventricular white matter, most commonly indicating chronic ischemic microangiopathy. Vascular: Aneurysm coils at the anterior communicating artery. Skull: The visualized skull base, calvarium and extracranial soft tissues are normal. Sinuses/Orbits: No fluid levels or advanced mucosal thickening of the visualized paranasal sinuses. No mastoid or middle ear effusion. The orbits are normal. ASPECTS [facility] Stroke Program Early CT Score) - Ganglionic level infarction (caudate, lentiform nuclei, internal capsule, insula, M1-M3 cortex): 7 - Supraganglionic infarction (M4-M6 cortex): 3 Total score (0-10 with 10 being normal): 10 IMPRESSION: 1. No acute intracranial abnormality. 2. ASPECTS is 10. These results were communicated to Dr. Roland Rack at 7:56 pm on 04/09/2021 by text page via the [facility] messaging system. Electronically Signed   By: Ulyses Jarred M.D.   On: 04/09/2021 19:55    CT ANGIO HEAD NECK W WO CM W PERF (CODE STROKE)  Result Date: 04/09/2021 CLINICAL DATA:  Stroke follow-up FINDINGS: CTA NECK FINDINGS SKELETON: There is no bony spinal canal stenosis. No lytic or blastic lesion. OTHER NECK: Normal pharynx, larynx and major salivary glands. No cervical lymphadenopathy. Unremarkable thyroid gland. UPPER CHEST: No pneumothorax or pleural effusion. No nodules or masses. AORTIC ARCH: There is calcific atherosclerosis of the aortic arch. There is no aneurysm, dissection or hemodynamically significant stenosis of the visualized portion of the aorta. Conventional 3 vessel aortic branching pattern. The visualized proximal subclavian arteries are widely patent. RIGHT CAROTID SYSTEM: Normal without aneurysm, dissection or stenosis. LEFT CAROTID SYSTEM: Normal without aneurysm, dissection or stenosis. VERTEBRAL ARTERIES: Left dominant configuration. Both origins are clearly patent. There is no dissection, occlusion or flow-limiting stenosis to the skull base (V1-V3 segments). CTA HEAD FINDINGS POSTERIOR CIRCULATION: --Vertebral arteries: Normal V4 segments. --Inferior cerebellar arteries: Normal. --Basilar artery: Normal. --Superior cerebellar arteries: Normal. --Posterior cerebral arteries (PCA): Normal. ANTERIOR CIRCULATION: --Intracranial internal carotid arteries: Normal. --Anterior cerebral arteries (ACA): Diminutive right A1 segment. Coil mass at the anterior communicating artery. --Middle cerebral arteries (MCA): Normal. VENOUS SINUSES: As permitted by contrast timing, patent. ANATOMIC VARIANTS: None Review of the MIP images confirms  the above findings. CT PERFUSION FINDINGS CBF <30%: 0 mL Tmax > 6.0s: 16 mL Mismatch volume: 16 mL Infarction location: No completed infarction. Areas of ischemia in the left occipital and posterior parietal lobes. IMPRESSION: 1. No emergent large vessel occlusion or high-grade stenosis of the intracranial arteries. 2. Coil mass at the anterior  communicating artery. 3. 16 mL area of ischemia in the left occipital and posterior parietal lobes. These results were called by telephone at the time of interpretation on 04/09/2021 at 8:22 pm to provider MCNEILL [facility] , who verbally acknowledged these results. Aortic Atherosclerosis (ICD10-I70.0). Electronically Signed   By: Ulyses Jarred M.D.   On: 04/09/2021 20:23    PHYSICAL EXAM  Temp:  [97.2 F (36.2 C)-99.6 F (37.6 C)] 97.2 F (36.2 C) (08/07 1322) Pulse Rate:  [65-95] 66 (08/07 1439) Resp:  [11-23] 13 (08/07 1439) BP: (96-183)/(61-115) 177/62 (08/07 1439) SpO2:  [90 %-100 %] 100 % (08/07 1439) Weight:  [78.2 kg] 78.2 kg (08/07 1400)  General - Well nourished, well developed, mild distress due to left sided HA.  Ophthalmologic - fundi not visualized due to noncooperation.  Cardiovascular - Regular rhythm and rate.  Neuro - awake, alert, eyes open, orientated to age, place, year but perseverated on year for answering month. Mild expressive aphasia with word finding difficulty and paraphasic errors, able to name 1/3 and repeat simple sentences, following all simple commands. No gaze palsy, tracking bilaterally, visual field full, PERRL. Mild right facial droop. Tongue midline. Bilateral UEs 4+/5, no drift. Bilaterally LLE 4/5, no drift, RLE 3/5 due to right hip pain. B/l ankle DF/PF equal strength. Sensation symmetrical bilaterally, no sensory neglect, b/l FTN intact, gait not tested.   NIH Stroke Scale  Level Of Consciousness 0=Alert; keenly responsive 1=Arouse to minor stimulation 2=Requires repeated stimulation to arouse or movements to pain 3=postures or unresponsive 0  LOC Questions to Month and Age 24=Answers both questions correctly 1=Answers one question correctly or dysarthria/intubated/trauma/language barrier 2=Answers neither question correctly or aphasia 1  LOC Commands      -Open/Close eyes     -Open/close grip     -Pantomime commands if communication barrier  0=Performs both tasks correctly 1=Performs one task correctly 2=Performs neighter task correctly 0  Best Gaze     -Only assess horizontal gaze 0=Normal 1=Partial gaze palsy 2=Forced deviation, or total gaze paresis 0  Visual 0=No visual loss 1=Partial hemianopia 2=Complete hemianopia 3=Bilateral hemianopia (blind including cortical blindness) 0  Facial Palsy     -Use grimace if obtunded 0=Normal symmetrical movement 1=Minor paralysis (asymmetry) 2=Partial paralysis (lower face) 3=Complete paralysis (upper and lower face) 1  Motor  0=No drift for 10/5 seconds 1=Drift, but does not hit bed 2=Some antigravity effort, hits  bed 3=No effort against gravity, limb falls 4=No movement 0=Amputation/joint fusion Right Arm 0     Leg 1    Left Arm 0     Leg 0  Limb Ataxia     - FNT/HTS 0=Absent or does not understand or paralyzed or amputation/joint fusion 1=Present in one limb 2=Present in two limbs 0  Sensory 0=Normal 1=Mild to moderate sensory loss 2=Severe to total sensory loss or coma/unresponsive 0  Best Language 0=No aphasia, normal 1=Mild to moderate aphasia 2=Severe aphasia 3=Mute, global aphasia, or coma/unresponsive 1  Dysarthria 0=Normal 1=Mild to moderate 2=Severe, unintelligible or mute/anarthric 0=intubated/unable to test 0  Extinction/Neglect 0=No abnormality 1=visual/tactile/auditory/spatia/personal inattention/Extinction to bilateral simultaneous stimulation 2=Profound neglect/extinction more than 1 modality  0  Total   4  ASSESSMENT/PLAN Ms. Sabrina [name] is a 80 y.o. female with history of  hypertension, hyperlipidemia, cerebral aneurysm, myasthenia gravis on cellcept, hypothyroidism, HSV, bradycardia and asthma who began to develop aphasia and HA around 2 PM on 8/6. Came to ER still has aphasia and right hemianopia, but not candidate for tPA given outside window. CTA head and neck no LVO but some penumbra on the CTP. Symptoms gradually improved and MRI  overnight no acute infarct. Per daughter, pt was in her normal speech before 1:30pm. Around 2pm, pt reported left sided HA and aphasia again. CT no acute finding. NIHSS = 4. Etiology for pt condition concerning for stroke vs. Complicated migraine. Discussed with daughter about benefit and risk of tPA, she gave consent for tPA. She received one dose of hydralazine before tPA. Will do CTA head and neck with perfusion again to rule out LVO. MRI at 24 hours.   This patient is critically ill due to acute stroke symptoms, s/p tPA and at significant risk of neurological worsening, death form recurrent stroke, hemorrhagic conversion and bleeding from tPA. This patient's care requires constant monitoring of vital signs, hemodynamics, respiratory and cardiac monitoring, review of multiple databases, neurological assessment, discussion with family, other specialists and medical decision making of high complexity. I spent 55 minutes of neurocritical care time in the care of this patient. I had long discussion with daughter at bedside, updated pt current condition, treatment plan and potential prognosis, and answered all the questions. They expressed understanding and appreciation.    Rosalin Hawking, MD PhD Stroke Neurology 04/10/2021 3:10 PM    To contact Stroke Continuity provider, please refer to [email or web address] After hours, contact General Neurology

## 2021-04-10 NOTE — ED Notes (Signed)
The pt appears sl confused

## 2021-04-10 NOTE — ED Notes (Signed)
Pt transferred to CT.

## 2021-04-10 NOTE — Evaluation (Signed)
Occupational Therapy Evaluation Patient Details Name: Sabrina [name] MRN: [id number] DOB: 1941/05/22 Today's Date: 04/10/2021    History of Present Illness Sabrina [name] is a 80 y.o. female with medical history significant of Myasthenia Gravis, HTN, HLD, cerebral aneurysm s/p coiling. Pt recently had R hip Fx and surgical repair 7/20-7/22.  Pt discharged to Rehab. CT:16 mL chronic area of ischemia in the left occipital and posterior parietal lobes; MRI: no new areas of infarct   Clinical Impression   This 80 yo female admitted with above presents to acute OT with PLOF before peri-prothestic fracture of being able to do her own basic ADLs with A for shower transfers and A'ing some with IADLs. Currently she is setup.S-min A +2 for basic ADLs with main limitation being the [place] of RLE. She will continue to benefit from acute OT with follow up back at SNF.    Follow Up Recommendations  SNF;Supervision/Assistance - 24 hour    Equipment Recommendations  None recommended by OT           Precautions / Restrictions Precautions Precautions: Fall Restrictions Weight Bearing Restrictions: Yes RLE Weight Bearing: Non weight bearing      Mobility Bed Mobility Overal bed mobility: Needs Assistance Bed Mobility: Supine to Sit     Supine to sit: Min guard;HOB elevated     General bed mobility comments: no rail, up to side of stretcher    Transfers Overall transfer level: Needs assistance Equipment used: Rolling walker (2 wheeled) Transfers: Sit to/from [facility] Sit to Stand: Min assist;+2 physical assistance Stand pivot transfers: Min assist;+2 physical assistance       General transfer comment: Pt able to maintain [place] RLE with sit<>stand and stand pivot    Balance Overall balance assessment: Needs assistance Sitting-balance support: No upper extremity supported;Feet supported Sitting balance-Leahy Scale: Good     Standing balance support: Bilateral  upper extremity supported Standing balance-Leahy Scale: Poor Standing balance comment: requires RW and additional external A of therapist                           ADL either performed or assessed with clinical judgement   ADL Overall ADL's : Needs assistance/impaired Eating/Feeding: Independent;Sitting   Grooming: Set up;Sitting   Upper Body Bathing: Set up;Sitting   Lower Body Bathing: Moderate assistance Lower Body Bathing Details (indicate cue type and reason): min A sit<>stand +2 Upper Body Dressing : Set up;Sitting   Lower Body Dressing: Maximal assistance Lower Body Dressing Details (indicate cue type and reason): min A +2 sit<>stand Toilet Transfer: Minimal assistance;+2 for physical assistance;Stand-pivot Toilet Transfer Details (indicate cue type and reason): going to left, simulatd bed>chair Toileting- Clothing Manipulation and Hygiene: Maximal assistance Toileting - Clothing Manipulation Details (indicate cue type and reason): min A +2 sit<>stand             Vision Baseline Vision/History: Wears glasses Wears Glasses: Reading only Patient Visual Report: No change from baseline Vision Assessment?: Yes Eye Alignment: Within Functional Limits Ocular Range of Motion: Within Functional Limits Alignment/Gaze Preference: Within Defined Limits Tracking/Visual Pursuits: Able to track stimulus in all quads without difficulty Saccades: Within functional limits Convergence: Within functional limits Visual Fields: No apparent deficits            Pertinent Vitals/Pain Pain Assessment: Faces Faces Pain Scale: Hurts a little bit Pain Location: R hip occassionally with activity Pain Descriptors / Indicators: Sore;Grimacing Pain Intervention(s): Limited activity within patient's  tolerance;Monitored during session;Repositioned     Hand Dominance Right   Extremity/Trunk Assessment Upper Extremity Assessment Upper Extremity Assessment: Overall WFL for tasks  assessed           Communication Communication Communication: No difficulties   Cognition Arousal/Alertness: Awake/alert Behavior During Therapy: WFL for tasks assessed/performed Overall Cognitive Status: Within Functional Limits for tasks assessed                                                Home Living Family/patient expects to be discharged to:: Skilled nursing facility   Available Help at Discharge: Family;Available 24 hours/day Type of Home: House                              Lives With: Daughter    Prior Functioning/Environment Level of Independence: Needs assistance  Gait / Transfers Assistance Needed: Prior to fall with resulant hip fracture she walked with a rollator, helped with dishes and laundry and helped make beds ADL's / Homemaking Assistance Needed: daughter assists with shower transfer, sits on 3 in 1 in shower            OT Problem List: Decreased strength;Decreased range of motion;Impaired balance (sitting and/or standing);Pain      OT Treatment/Interventions: Self-care/ADL training;DME and/or AE instruction;Balance training;Patient/family education    OT Goals(Current goals can be found in the care plan section) Acute Rehab OT Goals Patient Stated Goal: to go back to rehab OT Goal Formulation: With patient Time For Goal Achievement: 04/07/21 Potential to Achieve Goals: Good  OT Frequency: Min 2X/week   Barriers to D/C: Decreased caregiver support          Co-evaluation PT/OT/SLP Co-Evaluation/Treatment: Yes Reason for Co-Treatment: For patient/therapist safety;To address functional/ADL transfers PT goals addressed during session: Mobility/safety with mobility;Balance;Proper use of DME OT goals addressed during session: Strengthening/ROM;ADL's and self-care      AM-PAC OT "6 Clicks" Daily Activity     Outcome Measure Help from another person eating meals?: None Help from another person taking care of  personal grooming?: A Little Help from another person toileting, which includes using toliet, bedpan, or urinal?: A Lot Help from another person bathing (including washing, rinsing, drying)?: A Lot Help from another person to put on and taking off regular upper body clothing?: A Little Help from another person to put on and taking off regular lower body clothing?: A Lot 6 Click Score: 16   End of Session Equipment Utilized During Treatment: Rolling walker;Gait belt Nurse Communication: Mobility status  Activity Tolerance: Patient tolerated treatment well Patient left: in chair;with call bell/phone within reach;with family/visitor present  OT Visit Diagnosis: Unsteadiness on feet (R26.81);Other abnormalities of gait and mobility (R26.89);History of falling (Z91.81);Pain Pain - Right/Left: Right Pain - part of body: Hip                Time: [id number] OT Time Calculation (min): 22 min Charges:  OT General Charges $OT Visit: 1 Visit OT Evaluation $OT Eval Moderate Complexity: [place], OTR/L Acute [employer] Pager [phone] Office [phone]    Almon Register 04/10/2021, 2:05 PM

## 2021-04-10 NOTE — ED Notes (Signed)
Pt to CT

## 2021-04-10 NOTE — Progress Notes (Signed)
STROKE TEAM PROGRESS NOTE   INTERVAL HISTORY No acute events. No visitors at bedside.  Patient recalls first noticing speech difficulty while on the phone with friend yesterday. This progressed over a few hours until she let her daughter know about it and she brought her to ER. She is now reporting her speech is much better and feels almost back to normal at present. She denies new concerns or worsening. We discussed her ongoing stroke work up and plan of care. Her questions were answered. She would like to return home today. She resides with there daughter. Daughter has been unreachable by phone this morning per patient and ED nurse.   Vitals:   04/10/21 0434 04/10/21 0715 04/10/21 0900 04/10/21 1100  BP:  (!) 149/83 (!) 146/69   Pulse:  85 73 95  Resp:  (!) '22 14 19  '$ Temp: 98.2 F (36.8 C)     TempSrc:      SpO2:  98% 90% 94%   CBC:  Recent Labs  Lab 04/09/21 2022 04/09/21 2042  WBC 5.7  --   NEUTROABS 3.9  --   HGB 12.3 11.9*  HCT 37.2 35.0*  MCV 87.5  --   PLT 504*  --    Basic Metabolic Panel:  Recent Labs  Lab 04/09/21 2022 04/09/21 2042 04/10/21 0937  NA 132* 132* 133*  K 3.3* 3.3* 3.8  CL 101 100 102  CO2 22  --  22  GLUCOSE 114* 114* 182*  BUN 6* 5* 6*  CREATININE 0.73 0.60 0.66  CALCIUM 9.2  --  9.4  MG  --   --  1.9   Lipid Panel:  Recent Labs  Lab 04/10/21 0438  CHOL 165  TRIG 159*  HDL 41  CHOLHDL 4.0  VLDL 32  LDLCALC 92   HgbA1c:  Recent Labs  Lab 04/10/21 0438  HGBA1C 5.2   Urine Drug Screen:  Recent Labs  Lab 04/09/21 2051  LABOPIA NONE DETECTED  COCAINSCRNUR NONE DETECTED  LABBENZ POSITIVE*  AMPHETMU NONE DETECTED  THCU NONE DETECTED  LABBARB NONE DETECTED    Alcohol Level  Recent Labs  Lab 04/10/21 0438  ETH <10    IMAGING past 24 hours MR BRAIN WO CONTRAST  Result Date: 04/10/2021 CLINICAL DATA:  Confusion and slurred speech EXAM: MRI HEAD WITHOUT CONTRAST TECHNIQUE: Multiplanar, multiecho pulse sequences of the  brain and surrounding structures were obtained without intravenous contrast. COMPARISON:  None. FINDINGS: Brain: No acute infarct, mass effect or extra-axial collection. No acute or chronic hemorrhage. There is multifocal hyperintense T2-weighted signal within the white matter. Generalized volume loss without a clear lobar predilection. The midline structures are normal. Vascular: Major flow voids are preserved. Skull and upper cervical spine: Normal calvarium and skull base. Visualized upper cervical spine and soft tissues are normal. Sinuses/Orbits:No paranasal sinus fluid levels or advanced mucosal thickening. No mastoid or middle ear effusion. Normal orbits. IMPRESSION: 1. No acute intracranial abnormality. 2. Findings of chronic small vessel ischemia and generalized volume loss. Electronically Signed   By: Ulyses Jarred M.D.   On: 04/10/2021 00:18   CT HEAD CODE STROKE WO CONTRAST  Result Date: 04/09/2021 CLINICAL DATA:  Code stroke.  Acute neurologic deficit EXAM: CT HEAD WITHOUT CONTRAST TECHNIQUE: Contiguous axial images were obtained from the base of the skull through the vertex without intravenous contrast. COMPARISON:  None. FINDINGS: Brain: There is no mass, hemorrhage or extra-axial collection. There is generalized atrophy without lobar predilection. There is hypoattenuation of the periventricular  white matter, most commonly indicating chronic ischemic microangiopathy. Vascular: Aneurysm coils at the anterior communicating artery. Skull: The visualized skull base, calvarium and extracranial soft tissues are normal. Sinuses/Orbits: No fluid levels or advanced mucosal thickening of the visualized paranasal sinuses. No mastoid or middle ear effusion. The orbits are normal. ASPECTS [facility] Stroke Program Early CT Score) - Ganglionic level infarction (caudate, lentiform nuclei, internal capsule, insula, M1-M3 cortex): 7 - Supraganglionic infarction (M4-M6 cortex): 3 Total score (0-10 with 10 being  normal): 10 IMPRESSION: 1. No acute intracranial abnormality. 2. ASPECTS is 10. These results were communicated to Dr. Roland Rack at 7:56 pm on 04/09/2021 by text page via the [facility] messaging system. Electronically Signed   By: Ulyses Jarred M.D.   On: 04/09/2021 19:55   CT ANGIO HEAD NECK W WO CM W PERF (CODE STROKE)  Result Date: 04/09/2021 CLINICAL DATA:  Stroke follow-up FINDINGS: CTA NECK FINDINGS SKELETON: There is no bony spinal canal stenosis. No lytic or blastic lesion. OTHER NECK: Normal pharynx, larynx and major salivary glands. No cervical lymphadenopathy. Unremarkable thyroid gland. UPPER CHEST: No pneumothorax or pleural effusion. No nodules or masses. AORTIC ARCH: There is calcific atherosclerosis of the aortic arch. There is no aneurysm, dissection or hemodynamically significant stenosis of the visualized portion of the aorta. Conventional 3 vessel aortic branching pattern. The visualized proximal subclavian arteries are widely patent. RIGHT CAROTID SYSTEM: Normal without aneurysm, dissection or stenosis. LEFT CAROTID SYSTEM: Normal without aneurysm, dissection or stenosis. VERTEBRAL ARTERIES: Left dominant configuration. Both origins are clearly patent. There is no dissection, occlusion or flow-limiting stenosis to the skull base (V1-V3 segments). CTA HEAD FINDINGS POSTERIOR CIRCULATION: --Vertebral arteries: Normal V4 segments. --Inferior cerebellar arteries: Normal. --Basilar artery: Normal. --Superior cerebellar arteries: Normal. --Posterior cerebral arteries (PCA): Normal. ANTERIOR CIRCULATION: --Intracranial internal carotid arteries: Normal. --Anterior cerebral arteries (ACA): Diminutive right A1 segment. Coil mass at the anterior communicating artery. --Middle cerebral arteries (MCA): Normal. VENOUS SINUSES: As permitted by contrast timing, patent. ANATOMIC VARIANTS: None Review of the MIP images confirms the above findings. CT PERFUSION FINDINGS CBF <30%: 0 mL Tmax > 6.0s: 16 mL  Mismatch volume: 16 mL Infarction location: No completed infarction. Areas of ischemia in the left occipital and posterior parietal lobes. IMPRESSION: 1. No emergent large vessel occlusion or high-grade stenosis of the intracranial arteries. 2. Coil mass at the anterior communicating artery. 3. 16 mL area of ischemia in the left occipital and posterior parietal lobes. These results were called by telephone at the time of interpretation on 04/09/2021 at 8:22 pm to provider [facility] , who verbally acknowledged these results. Aortic Atherosclerosis (ICD10-I70.0). Electronically Signed   By: Ulyses Jarred M.D.   On: 04/09/2021 20:23    PHYSICAL EXAM Constitutional: Appears well-developed and well-nourished. Psych: Affect appropriate to situation Eyes: No scleral injection Cardiovascular: Normal rate and regular rhythm. Respiratory: No extra work of breathing.  Skin: WDI   Neuro: Mental Status: Alert, oriented x4.Conversant with clear, fluent speech that is appropriate in context. Repeats 5 word sentence. Names 3/3. Follows multi-step commands.  Cranial Nerves: II: She has a dense right hemianopia. Pupils are equal, round, and reactive to light.   III,IV, VI: EOMI without ptosis or diploplia. V: Facial sensation is symmetric to temperature VII: Facial movement is symmetric. VIII: hearing is intact to voice X: Uvula elevates symmetrically XI: Shoulder shrug is symmetric. XII: tongue is midline without atrophy or fasciculations. Motor: Tone is normal. Bulk is normal. 5/5 strength was present in left arm and  leg, her right arm has mild drift, 4+/5 strength, right leg is limited due to recent hip fracture with at least antigravity strength Sensory: Sensation is symmetric to light touch and temperature Cerebellar: Arm rolling intact HTS intact on the left, right testing limited by recent hip fx  ASSESSMENT/PLAN Ms. Sabrina [name] is a 80 y.o. 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 She subsequently called her daughter around 5:45 PM and asked her to come over because she stated that something was "not right."  Her daughter found her to have significant difficulty speaking and brought into the emergency department. She appeared aphasic on arrival for which code stroke was activated. Out of window for tPA.   80 year old female with stroke like episode consisting of new onset aphasia and visual cut with perfusion deficit on CTP consistent with a left MCA branch infarct found to have no evidence of stroke on MRI.    Code Stroke CT head  No acute abnormality. Atrophy. ASPECTS 10.  CTA head & neck with perfusion No emergent large vessel occlusion or high-grade stenosis of the intracranial arteries. Coil mass at the anterior communicating artery. 16 mL area of ischemia in the left occipital and posterior parietal lobes.  MRI   No acute intracranial abnormality. Findings of chronic small vessel ischemia and generalized volume loss. 2D Echo PENDING EEG is PENDING LDL 92 HgbA1c 5.2 VTE prophylaxis - is recommended    Diet   Diet Heart Room service appropriate? Yes; Fluid consistency: Thin  Treatment recommended: DAPT x 3 weeks then ASA monotherapy  Therapy recommendations:  Pending OT/PT. SLP recommends further speech therapy for high level deficits. Disposition:  TBD, likely home   Hypertension Stable Permissive hypertension (OK if < 220/120) but gradually normalize in 5-7 days Long-term BP goal normotensive  Hyperlipidemia Home meds:   LDL 92, higher than goal < 70 High intensity statin is not indicated. Lipitor '20mg'$  initiated.   Continue statin at discharge        Myasthenia Gravis On cellcept for several years without recent flares per patient report Followed by [facility], Dr. Meryl Crutch  Other Stroke  Risk Factors Advanced Age >/= 47   Other Active Problems Hyponatremia Hypokalemia  Hospital day # 1  Devron Cohick A Bailey-Modzik, NP-C   To contact Stroke Continuity provider, please refer to [email or web address] After hours, contact General Neurology

## 2021-04-10 NOTE — Progress Notes (Signed)
Patient's daughter arrived on the unit soon after shift change asking for updates regarding the plan of care for the patient. When I spoke of the MRI taking place tomorrow morning the patient and the daughter expressed heavy concerns about the patient's anxiety and claustrophobia. Her daughter informed me that for MRIs in the past she has been medicated prior, which helped with the anxiety. I informed the daughter that I would pass this along to the care team.   Normajean Baxter, RN

## 2021-04-10 NOTE — ED Notes (Signed)
Pt with sudden onset L sided headache with expressive aphasia. MD paged.

## 2021-04-11 ENCOUNTER — Other Ambulatory Visit: Payer: Self-pay

## 2021-04-11 ENCOUNTER — Inpatient Hospital Stay (HOSPITAL_COMMUNITY): Payer: Medicare Other

## 2021-04-11 DIAGNOSIS — I6389 Other cerebral infarction: Secondary | ICD-10-CM | POA: Diagnosis not present

## 2021-04-11 DIAGNOSIS — R4701 Aphasia: Secondary | ICD-10-CM

## 2021-04-11 LAB — CBC
HCT: 36.3 % (ref 36.0–46.0)
Hemoglobin: 11.9 g/dL — ABNORMAL LOW (ref 12.0–15.0)
MCH: 28.8 pg (ref 26.0–34.0)
MCHC: 32.8 g/dL (ref 30.0–36.0)
MCV: 87.9 fL (ref 80.0–100.0)
Platelets: 456 10*3/uL — ABNORMAL HIGH (ref 150–400)
RBC: 4.13 MIL/uL (ref 3.87–5.11)
RDW: 13.8 % (ref 11.5–15.5)
WBC: 7.6 10*3/uL (ref 4.0–10.5)
nRBC: 0 % (ref 0.0–0.2)

## 2021-04-11 LAB — LIPID PANEL
Cholesterol: 139 mg/dL (ref 0–200)
HDL: 39 mg/dL — ABNORMAL LOW (ref >40)
LDL Cholesterol: 75 mg/dL (ref 0–99)
Total CHOL/HDL Ratio: 3.6 RATIO
Triglycerides: 124 mg/dL (ref <150)
VLDL: 25 mg/dL (ref 0–40)

## 2021-04-11 LAB — ECHOCARDIOGRAM COMPLETE
Area-P 1/2: 3.1 cm2
P 1/2 time: 429 msec
S' Lateral: 3.35 cm
Single Plane A4C EF: 69.4 %
Weight: 2758.4 oz

## 2021-04-11 LAB — BASIC METABOLIC PANEL
Anion gap: 9 (ref 5–15)
BUN: 6 mg/dL — ABNORMAL LOW (ref 8–23)
CO2: 23 mmol/L (ref 22–32)
Calcium: 9 mg/dL (ref 8.9–10.3)
Chloride: 99 mmol/L (ref 98–111)
Creatinine, Ser: 0.69 mg/dL (ref 0.44–1.00)
GFR, Estimated: >60 mL/min (ref >60)
Glucose, Bld: 108 mg/dL — ABNORMAL HIGH (ref 70–99)
Potassium: 3.6 mmol/L (ref 3.5–5.1)
Sodium: 131 mmol/L — ABNORMAL LOW (ref 135–145)

## 2021-04-11 LAB — HEMOGLOBIN A1C
Hgb A1c MFr Bld: 5.1 % (ref 4.8–5.6)
Mean Plasma Glucose: 99.67 mg/dL

## 2021-04-11 MED ORDER — ASPIRIN EC 81 MG PO TBEC
81.0000 mg | DELAYED_RELEASE_TABLET | Freq: Every day | ORAL | Status: DC
  Administered 2021-04-11 – 2021-04-12 (×2): 81 mg via ORAL
  Filled 2021-04-11 (×2): qty 1

## 2021-04-11 MED ORDER — ENOXAPARIN SODIUM 40 MG/0.4ML IJ SOSY
40.0000 mg | PREFILLED_SYRINGE | INTRAMUSCULAR | Status: DC
Start: 2021-04-11 — End: 2021-04-13
  Administered 2021-04-11 – 2021-04-12 (×2): 40 mg via SUBCUTANEOUS
  Filled 2021-04-11 (×2): qty 0.4

## 2021-04-11 MED ORDER — LORAZEPAM 1 MG PO TABS
2.0000 mg | ORAL_TABLET | Freq: Once | ORAL | Status: AC
  Administered 2021-04-11: 2 mg via ORAL
  Filled 2021-04-11: qty 2

## 2021-04-11 MED ORDER — CLOPIDOGREL BISULFATE 75 MG PO TABS
75.0000 mg | ORAL_TABLET | Freq: Every day | ORAL | Status: DC
  Administered 2021-04-11 – 2021-04-12 (×2): 75 mg via ORAL
  Filled 2021-04-11 (×2): qty 1

## 2021-04-11 NOTE — Progress Notes (Signed)
  Echocardiogram 2D Echocardiogram has been performed.  Sabrina [name] 04/11/2021, 9:37 AM

## 2021-04-11 NOTE — Progress Notes (Signed)
OT Cancellation Note  Patient Details Name: Sabrina [name] MRN: [id number] DOB: 04/11/1941   Cancelled Treatment:    Reason Eval/Treat Not Completed: Active bedrest order (new code stroke called with TPA) OT order received and appreciated however this conflicts with current bedrest order set. Please increase activity tolerance as appropriate and remove bedrest from orders. . Please contact OT at [phone] if bed rest order is discontinued. OT will hold evaluation at this time and will check back as time allows pending increased activity orders.   Billey Chang, OTR/L  Acute Rehabilitation Services Pager: [phone] Office: [phone] .  04/11/2021, 8:21 AM

## 2021-04-11 NOTE — Progress Notes (Addendum)
STROKE TEAM PROGRESS NOTE   INTERVAL HISTORY RN at the bedside. Pt reclining in bed, back to her baseline. No HA, no aphasia. Neuro intact. Pending MRI.   Vitals:   04/11/21 0800 04/11/21 0900 04/11/21 1000 04/11/21 1100  BP: (!) 138/56 (!) 134/53 (!) 144/61 (!) 153/74  Pulse: 62 64 69 83  Resp: 10 (!) '8 11 17  '$ Temp: 98.8 F (37.1 C)     TempSrc: Oral     SpO2: 96% 95% 97% 96%  Weight:       CBC:  Recent Labs  Lab 04/09/21 2022 04/09/21 2042 04/11/21 0305  WBC 5.7  --  7.6  NEUTROABS 3.9  --   --   HGB 12.3 11.9* 11.9*  HCT 37.2 35.0* 36.3  MCV 87.5  --  87.9  PLT 504*  --  99991111*   Basic Metabolic Panel:  Recent Labs  Lab 04/10/21 0937 04/11/21 0305  NA 133* 131*  K 3.8 3.6  CL 102 99  CO2 22 23  GLUCOSE 182* 108*  BUN 6* 6*  CREATININE 0.66 0.69  CALCIUM 9.4 9.0  MG 1.9  --    Lipid Panel:  Recent Labs  Lab 04/11/21 0305  CHOL 139  TRIG 124  HDL 39*  CHOLHDL 3.6  VLDL 25  LDLCALC 75   HgbA1c:  Recent Labs  Lab 04/11/21 0305  HGBA1C 5.1   Urine Drug Screen:  Recent Labs  Lab 04/09/21 2051  LABOPIA NONE DETECTED  COCAINSCRNUR NONE DETECTED  LABBENZ POSITIVE*  AMPHETMU NONE DETECTED  THCU NONE DETECTED  LABBARB NONE DETECTED    Alcohol Level  Recent Labs  Lab 04/10/21 0438  ETH <10    IMAGING past 24 hours EEG adult  Result Date: 04/10/2021 Derek Jack, MD     04/10/2021  3:34 PM Routine EEG Report Sabrina [name] is a 80 y.o. female with a history of aphasia who is undergoing an EEG to evaluate for seizures. Report: This EEG was acquired with electrodes placed according to the International 10-20 electrode system (including Fp1, Fp2, F3, F4, C3, C4, P3, P4, O1, O2, T3, T4, T5, T6, A1, A2, Fz, Cz, Pz). The following electrodes were missing or displaced: none. The occipital dominant rhythm was 9 Hz. This activity is reactive to stimulation. Drowsiness was manifested by background fragmentation; deeper stages of sleep were not identified.  There was no focal slowing. There were no interictal epileptiform discharges. There were no electrographic seizures identified. Photic stimulation and hyperventilation were not performed. Impression: This EEG was obtained while awake and drowsy and is normal.   Clinical Correlation: Normal EEGs, however, do not rule out epilepsy. Sabrina Monks, MD [facility] [phone] If 7pm- 7am, please page neurology on call as listed in [place].   CT HEAD CODE STROKE WO CONTRAST  Result Date: 04/10/2021 CLINICAL DATA:  Code stroke. Neuro deficit, acute, stroke suspected. Acute onset left-sided headache with aphasia. EXAM: CT HEAD WITHOUT CONTRAST TECHNIQUE: Contiguous axial images were obtained from the base of the skull through the vertex without intravenous contrast. COMPARISON:  Head CT 04/09/2021 and MRI 04/10/2021 FINDINGS: Brain: Streak artifact from embolization coils obscures portions of the temporal and inferior frontal lobes. Within this limitation, no acute infarct, intracranial hemorrhage, mass, midline shift, or extra-axial fluid collection is identified. Mild cerebral atrophy is within normal limits for age. Mild hypoattenuation in the cerebral white matter is unchanged and nonspecific but compatible with chronic small vessel ischemia. Vascular: Embolization coils in the anterior  communicating region. No hyperdense vessel. Skull: No fracture or suspicious osseous lesion. Sinuses/Orbits: Visualized paranasal sinuses and mastoid air cells are clear. Bilateral cataract extraction. Other: None. ASPECTS [facility] Stroke Program Early CT Score) - Ganglionic level infarction (caudate, lentiform nuclei, internal capsule, insula, M1-M3 cortex): 7 - Supraganglionic infarction (M4-M6 cortex): 3 Total score (0-10 with 10 being normal): 10 IMPRESSION: 1. No evidence of acute intracranial abnormality. 2. ASPECTS is 10. These results were communicated to Dr. Erlinda Hong at 2:30 pm on 04/10/2021 by text page via the [facility]  messaging system. Electronically Signed   By: Logan Bores M.D.   On: 04/10/2021 14:30   CT ANGIO HEAD NECK W WO CM W PERF (CODE STROKE)  Result Date: 04/10/2021 CLINICAL DATA:  Stroke, follow-up.  Aphasia. EXAM: CT ANGIOGRAPHY HEAD AND NECK CT PERFUSION BRAIN TECHNIQUE: Multidetector CT imaging of the head and neck was performed using the standard protocol during bolus administration of intravenous contrast. Multiplanar CT image reconstructions and MIPs were obtained to evaluate the vascular anatomy. Carotid stenosis measurements (when applicable) are obtained utilizing NASCET criteria, using the distal internal carotid diameter as the denominator. Multiphase CT imaging of the brain was performed following IV bolus contrast injection. Subsequent parametric perfusion maps were calculated using RAPID software. CONTRAST:  161m OMNIPAQUE IOHEXOL 350 MG/ML SOLN COMPARISON:  Head MRI 04/10/2021. Head and neck CTA and CTP 04/09/2021. FINDINGS: CTA NECK FINDINGS Aortic arch: Standard 3 vessel aortic arch with mild atherosclerotic plaque. No arch vessel origin stenosis. Right carotid system: Patent without evidence of a significant stenosis or dissection. Tortuous mid cervical ICA with mildly kinked appearance. Left carotid system: Patent without evidence of a significant stenosis or dissection. Tortuous mid cervical ICA. Vertebral arteries: Patent and codominant without evidence of a significant stenosis or dissection. Skeleton: Advanced cervical and thoracic disc degeneration and asymmetrically advanced left-sided cervical facet arthrosis. Mixed sclerotic and lucent lesion in the T1 spinous process, unchanged from a 2011 chest CT and considered benign. Other neck: No evidence of cervical lymphadenopathy or mass. Upper chest: Clear lung apices. Review of the MIP images confirms the above findings CTA HEAD FINDINGS Anterior circulation: The internal carotid arteries are patent from skull base to carotid termini with  mild atherosclerotic plaque not resulting in significant stenosis. The MCAs are patent without evidence of a proximal branch occlusion or significant proximal stenosis. The ACAs are patent with A1 and proximal A2 assessment limited by streak artifact from embolization coils in the anterior communicating region. The right A1 segment appears hypoplastic. No definite residual aneurysm filling is identified. Posterior circulation: The intracranial vertebral arteries are patent to the basilar with mild atherosclerotic irregularity but no significant stenosis. Patent PICA, AICA, and SCA origins are seen bilaterally. The basilar artery is widely patent. Posterior communicating arteries are diminutive or absent. Both PCAs are patent with mild irregularity but no evidence of a flow limiting proximal stenosis. No aneurysm is identified. Venous sinuses: As permitted by contrast timing, patent. Anatomic variants: None of significance. Review of the MIP images confirms the above findings CT Brain Perfusion Findings: ASPECTS: 10 CBF (<30%) Volume: 067mPerfusion (Tmax>6.0s) volume: 64m964mMPRESSION: 1. No emergent large vessel occlusion. 2. Mild intracranial atherosclerosis without significant proximal stenosis. 3. Widely patent cervical carotid and vertebral arteries. 4. Previous anterior communicating artery aneurysm coiling. 5. Negative CTP. 6. Aortic Atherosclerosis (ICD10-I70.0). Electronically Signed   By: AllLogan BoresD.   On: 04/10/2021 16:51    PHYSICAL EXAM  Temp:  [97.2 F (36.2 C)-98.8 F (37.1  C)] 98.8 F (37.1 C) (08/08 0800) Pulse Rate:  [62-114] 83 (08/08 1100) Resp:  [8-28] 17 (08/08 1100) BP: (110-183)/(49-128) 153/74 (08/08 1100) SpO2:  [89 %-100 %] 96 % (08/08 1100) Weight:  [78.2 kg] 78.2 kg (08/07 1400)  General - Well nourished, well developed, in no apparent distress.  Ophthalmologic - fundi not visualized due to noncooperation.  Cardiovascular - Regular rhythm and rate.  Mental Status -   Level of arousal and orientation to time, place, and person were intact. Language including expression, naming, repetition, comprehension was assessed and found intact. Attention span and concentration were normal. Fund of Knowledge was assessed and was intact.  Cranial Nerves II - XII - II - Visual field intact OU. III, IV, VI - Extraocular movements intact. V - Facial sensation intact bilaterally. VII - Facial movement intact bilaterally. VIII - Hearing & vestibular intact bilaterally. X - Palate elevates symmetrically. XI - Chin turning & shoulder shrug intact bilaterally. XII - Tongue protrusion intact.  Motor Strength - The patient's strength was normal in all extremities and pronator drift was absent except right hip pain due to recent fracture with 4/5 proximal strength.  Bulk was normal and fasciculations were absent.   Motor Tone - Muscle tone was assessed at the neck and appendages and was normal.  Reflexes - The patient's reflexes were symmetrical in all extremities and she had no pathological reflexes.  Sensory - Light touch, temperature/pinprick were assessed and were symmetrical.    Coordination - The patient had normal movements in the hands and feet with no ataxia or dysmetria.  Tremor was absent.  Gait and Station - deferred.   ASSESSMENT/PLAN Sabrina [name] is a 80 y.o. 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 On exam, also showed right hemianopia. Out of window for tPA. CTA head and neck no LVO but CTP showed perfusion deficit at left MCA. However, MRI neg. Developed episode again with HA and aphasia but no hemianopia in ER, s/p tPA  TIA/minor stroke s/p tPA vs. Complicated migraine Code Stroke CT head 8/6 - No acute abnormality. Atrophy. ASPECTS 10.  CTA head & neck with perfusion - No emergent large  vessel occlusion or high-grade stenosis of the intracranial arteries. Coil mass at the [facility]. 16 mL area of ischemia in the left occipital and posterior parietal lobes.  MRI 8/6 No acute intracranial abnormality. Code stroke CT head 8/7 - no acute finding CTA head and neck and CTP 8/7 - negative MRI 8/8 pending  2D Echo PENDING EEG PENDING LDL 92 HgbA1c 5.2 VTE prophylaxis - SCDs No antithrombotics PTA, now on no antithrombotics within 24h of tPA Therapy recommendations:  pending Disposition:  pending  Hypertension Stable BP goal < 180/105 post tPA Long-term BP goal normotensive  Hyperlipidemia Home meds:   LDL 92, higher than goal < 70 Lipitor '20mg'$  initiated, no high intensity due to advanced age and LDL not far from goal Continue statin at discharge        Myasthenia Gravis On cellcept for several years without recent flares per patient report Followed by [facility], Dr. 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 This patient's care requires constant monitoring of vital signs, hemodynamics, respiratory and cardiac monitoring, review of multiple databases, neurological assessment, discussion with family, other specialists and medical decision making of high complexity. I spent 40 minutes of neurocritical care time in the care of this patient.  Rosalin Hawking, MD PhD Stroke Neurology 04/11/2021 11:40 AM   To contact Stroke Continuity provider, please refer to [email or web address] After hours, contact General Neurology

## 2021-04-11 NOTE — Progress Notes (Signed)
PT Cancellation Note  Patient Details Name: Sabrina [name] MRN: [id number] DOB: Sep 02, 1941   Cancelled Treatment:    Reason Eval/Treat Not Completed: Active bedrest order  Wyona Almas, PT, DPT Acute Rehabilitation Services Pager [phone] Office [phone]    Deno Etienne 04/11/2021, 8:02 AM

## 2021-04-11 NOTE — Progress Notes (Signed)
Physical Therapy Treatment Patient Details Name: Sabrina [name] MRN: [id number] DOB: 27-Oct-1940 Today's Date: 04/11/2021    History of Present Illness Pt is an 80 y.o. female who presented 04/09/21 with AMS, visual cut, and aphasia. Pt recently had R hip Fx and surgical repair 7/20-7/22.  Pt discharged to Rehab. CT:16 mL chronic area of ischemia in the left occipital and posterior parietal lobes. MRI: no new areas of infarct. PMH: Myasthenia Gravis, HTN, HLD, cerebral aneurysm s/p coiling.    PT Comments    Pt tolerated treatment well with VSS throughout, but demonstrated decreased bed mobility compared to last session due to pain in RLE. Level of assist with transfers remains the similar to previous session with +2 physical assist. Pt's aphasia improved compared to previous session; however visual deficits remain. Pt able to recall and teach NWB precautions on RLE.     Follow Up Recommendations  SNF;Supervision/Assistance - 24 hour     Equipment Recommendations  None recommended by PT    Recommendations for Other Services       Precautions / Restrictions Precautions Precautions: Fall Restrictions Weight Bearing Restrictions: Yes RLE Weight Bearing: Non weight bearing    Mobility  Bed Mobility Overal bed mobility: Needs Assistance Bed Mobility: Rolling;Sidelying to Sit Rolling: Min assist;+2 for safety/equipment Sidelying to sit: Mod assist;+2 for physical assistance       General bed mobility comments: Min A for rolling +2 for safety and use of bedrails, limited by pain. Sidelying to sit mod A +2 to elevate trunk to upright position.    Transfers Overall transfer level: Needs assistance Equipment used: Rolling walker (2 wheeled) Transfers: Sit to/from [facility] Sit to Stand: Min assist;+2 physical assistance;From elevated surface Stand pivot transfers: Min assist;+2 physical assistance       General transfer comment: Pt maintained NWB on RLE during  transfers. Multimodal cueing provided during transfers for hand placement. Pt able to thoroughly recall her NWB precautions. Min A +2 for transfers with assist to power up to standing, guide pivot, and slowly lower to recliner.  Ambulation/Gait             General Gait Details: Unable at this time, NWB R leg.   Stairs             Wheelchair Mobility    Modified Rankin (Stroke Patients Only)       Balance Overall balance assessment: Needs assistance Sitting-balance support: Feet supported;No upper extremity supported Sitting balance-Leahy Scale: Good     Standing balance support: During functional activity;Bilateral upper extremity supported Standing balance-Leahy Scale: Poor Standing balance comment: Requires RW and external support                            Cognition Arousal/Alertness: Awake/alert Behavior During Therapy: WFL for tasks assessed/performed Overall Cognitive Status: Impaired/Different from baseline Area of Impairment: Memory                     Memory: Decreased short-term memory         General Comments: Pt A&Ox4. Demonstrated decreased short term memory; however, 3/3 with word recall with increased time and cueing      Exercises General Exercises - Lower Extremity Ankle Circles/Pumps: AROM;10 reps;Seated;Both Long Arc Quad: AROM;Both;10 reps;Seated Hip Flexion/Marching: AROM;Left;10 reps;Seated Other Exercises Other Exercises: Pt performed 2 trials of visual field testing. Demonstrated deficits in peripheral vision in L lower quadrant    General Comments  General comments (skin integrity, edema, etc.): VSS throughout      Pertinent Vitals/Pain Faces Pain Scale: Hurts a little bit Pain Location: RLE Pain Descriptors / Indicators: Discomfort;Grimacing Pain Intervention(s): Limited activity within patient's tolerance;Repositioned;Monitored during session    Home Living                      Prior  Function            PT Goals (current goals can now be found in the care plan section) Acute Rehab PT Goals Patient Stated Goal: to go back to rehab PT Goal Formulation: With patient/family Time For Goal Achievement: 04/24/21 Potential to Achieve Goals: Good Progress towards PT goals: Progressing toward goals    Frequency    Min 3X/week      PT Plan Current plan remains appropriate    Co-evaluation              AM-PAC PT "6 Clicks" Mobility   Outcome Measure  Help needed turning from your back to your side while in a flat bed without using bedrails?: A Lot Help needed moving from lying on your back to sitting on the side of a flat bed without using bedrails?: A Lot Help needed moving to and from a bed to a chair (including a wheelchair)?: A Lot Help needed standing up from a chair using your arms (e.g., wheelchair or bedside chair)?: A Lot Help needed to walk in hospital room?: Total Help needed climbing 3-5 steps with a railing? : Total 6 Click Score: 10    End of Session Equipment Utilized During Treatment: Gait belt Activity Tolerance: Patient tolerated treatment well Patient left: in chair;with chair alarm set;with call bell/phone within reach Nurse Communication: Mobility status PT Visit Diagnosis: Difficulty in walking, not elsewhere classified (R26.2);Pain;History of falling (Z91.81);Muscle weakness (generalized) (M62.81);Unsteadiness on feet (R26.81);Other abnormalities of gait and mobility (R26.89) Pain - Right/Left: Right Pain - part of body: Hip     Time: [id number] PT Time Calculation (min) (ACUTE ONLY): 29 min  Charges:  $Therapeutic Activity: 23-37 mins                     Louie Casa, SPT Acute Rehab: ([phone] [id number]    Domingo Dimes 04/11/2021, 3:59 PM

## 2021-04-12 DIAGNOSIS — G459 Transient cerebral ischemic attack, unspecified: Principal | ICD-10-CM

## 2021-04-12 DIAGNOSIS — G43109 Migraine with aura, not intractable, without status migrainosus: Secondary | ICD-10-CM

## 2021-04-12 LAB — BASIC METABOLIC PANEL
Anion gap: 7 (ref 5–15)
BUN: 7 mg/dL — ABNORMAL LOW (ref 8–23)
CO2: 24 mmol/L (ref 22–32)
Calcium: 9 mg/dL (ref 8.9–10.3)
Chloride: 103 mmol/L (ref 98–111)
Creatinine, Ser: 0.67 mg/dL (ref 0.44–1.00)
GFR, Estimated: >60 mL/min (ref >60)
Glucose, Bld: 116 mg/dL — ABNORMAL HIGH (ref 70–99)
Potassium: 3.5 mmol/L (ref 3.5–5.1)
Sodium: 134 mmol/L — ABNORMAL LOW (ref 135–145)

## 2021-04-12 LAB — CBC
HCT: 37 % (ref 36.0–46.0)
Hemoglobin: 12.2 g/dL (ref 12.0–15.0)
MCH: 29 pg (ref 26.0–34.0)
MCHC: 33 g/dL (ref 30.0–36.0)
MCV: 88.1 fL (ref 80.0–100.0)
Platelets: 416 10*3/uL — ABNORMAL HIGH (ref 150–400)
RBC: 4.2 MIL/uL (ref 3.87–5.11)
RDW: 13.7 % (ref 11.5–15.5)
WBC: 6.7 10*3/uL (ref 4.0–10.5)
nRBC: 0 % (ref 0.0–0.2)

## 2021-04-12 MED ORDER — ALPRAZOLAM 0.5 MG PO TABS: 0.5000 mg | ORAL_TABLET | Freq: Four times a day (QID) | ORAL | 0 refills | Status: AC

## 2021-04-12 MED ORDER — ASPIRIN 81 MG PO TBEC: 81.0000 mg | DELAYED_RELEASE_TABLET | Freq: Every day | ORAL | 11 refills | Status: AC

## 2021-04-12 MED ORDER — CLOPIDOGREL BISULFATE 75 MG PO TABS: 75.0000 mg | ORAL_TABLET | Freq: Every day | ORAL | Status: AC

## 2021-04-12 MED ORDER — ATORVASTATIN CALCIUM 20 MG PO TABS: 20.0000 mg | ORAL_TABLET | Freq: Every day | ORAL | Status: AC

## 2021-04-12 NOTE — TOC Transition Note (Signed)
Transition of Care [facility]) - CM/SW Discharge Note   Patient Details  Name: Sabrina [name] MRN: [id number] Date of Birth: 07/18/1941  [facility]) CM/SW Contact:  Benard Halsted, [place] Phone Number: 04/12/2021, 2:48 PM   Clinical Narrative:    Patient will DC to: [facility] Anticipated DC date: 04/12/21 Family notified: Daughter, Chief of Staff by: [facility] (11th in line)   Per MD patient ready for DC to [facility]. RN to call report prior to discharge [phone]). RN, patient, patient's family, and facility notified of DC. Discharge Summary and FL2 sent to facility. DC packet on chart. Ambulance transport requested for patient.   CSW will sign off for now as social work intervention is no longer needed. Please consult [place] again if new needs arise.     Final next level of care: Skilled Nursing Facility Barriers to Discharge: Barriers Resolved   Patient Goals and CMS Choice Patient states their goals for this hospitalization and ongoing recovery are:: Rehab CMS Medicare.gov Compare Post Acute Care list provided to:: Patient Represenative (must comment) Choice offered to / list presented to : Adult Children, Patient  Discharge Placement   Existing PASRR number confirmed : 04/12/21          Patient chooses bed at: [facility] Patient to be transferred to facility by: [place] Name of family member notified: Daughter Patient and family notified of of transfer: 04/12/21  Discharge Plan and Services In-house Referral: Clinical Social Work   Post Acute Care Choice: [place]                               Social Determinants of Health (SDOH) Interventions     Readmission Risk Interventions No flowsheet data found.

## 2021-04-12 NOTE — Discharge Instructions (Signed)
Sabrina [name],   You came to the hospital with stroke like symptoms- difficulty speaking and headache. Luckily your symptoms resolved.   Your MRI Brain did not show a stroke. It is possible your symptoms are due to transient ischemic attack or a complex migraine.   Since you had stroke like symptoms, you are at risk of having a stroke. To reduce your risk of having a stroke please take Aspirin and Plavix together for 21 days then take Aspirin only indefinitely.   Take Atorvastatin to help keep your cholesterol levels under control. This medication also helps to reduce your risk of having a stroke.   It was a pleasure caring for you. Your medical team wishes you the best.   - The Stroke Team

## 2021-04-12 NOTE — NC FL2 (Signed)
[place] LEVEL OF CARE SCREENING TOOL     IDENTIFICATION  Patient Name: Sabrina [name] Birthdate: Jan 14, 1941 Sex: female Admission Date (Current Location): 04/09/2021  [facility] and [place] Number:  Herbalist and Address:  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   Acute ischemic stroke ([place]) 04/09/2021   Fracture, proximal femur, right, closed, initial encounter ([place])    Hyponatremia 03/24/2021   Hypokalemia 03/24/2021   Anxiety 03/24/2021   Hip fracture ([place]) 03/23/2021   Unilateral primary osteoarthritis, left knee 11/26/2017   Lumbar stenosis 06/18/2015   Essential hypertension 02/20/2014   Bradycardia 02/20/2014   Swelling of left lower extremity 02/20/2014   Rotator cuff arthropathy 10/16/2012   Carpal tunnel syndrome 10/16/2012   Carpal tunnel syndrome of right wrist 05/31/2012   Rotator cuff impingement syndrome 12/22/2011   Irreducible incisional hernia 03/15/2011   Myasthenia gravis, 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[place]) 12/03/2004    Orientation RESPIRATION BLADDER Height & Weight     Self, Time, Situation, Place  Normal Continent, External catheter Weight: 172 lb 6.4 oz (78.2 kg) Height:     BEHAVIORAL SYMPTOMS/MOOD NEUROLOGICAL BOWEL NUTRITION STATUS      Incontinent Diet (Please see DC Summary)  AMBULATORY STATUS COMMUNICATION OF NEEDS Skin   Limited Assist Verbally Normal                       Personal Care Assistance Level of  Assistance  Bathing, Feeding, Dressing Bathing Assistance: Limited assistance Feeding assistance: Independent Dressing Assistance: Limited assistance     Functional Limitations Info  Hearing   Hearing Info: Impaired      SPECIAL CARE FACTORS FREQUENCY  PT (By licensed PT), OT (By licensed OT)     PT Frequency: 5x/week, NWB'ing RLE OT Frequency: 5x/week            Contractures Contractures Info: Not present    Additional Factors Info  Code Status, Allergies, Psychotropic Code Status Info: Full Allergies Info: Azathioprine, Other, Aminoglycosides, Avelox (Moxifloxacin Hcl In [place]), Beta Adrenergic Blockers, Calcium Channel Blockers, Cephalosporins, Ciprofloxacin, Clindamycin/lincomycin, Colistin, Fenofibrate, Fosamax (Alendronate Sodium), Imuran (Azathioprine Sodium), Iodinated Diagnostic Agents, Iodine, Macrolides And Ketolides, Magnesium-containing Compounds, Moxifloxacin, Niacin And Related, Norfloxacin, Ofloxacin, Pefloxacin, Penicillamine, Prednisolone, Prednisone, Procainamide, Quinidine, Quinine Derivatives, Succinylcholine, Tubocurarine, Vecuronium, Vicodin (Hydrocodone-acetaminophen) Psychotropic Info: Xanax, prozac, trazadone         Current Medications (04/12/2021):  This is the current hospital active medication list Current Facility-Administered Medications  Medication Dose Route Frequency Provider Last Rate Last Admin   acetaminophen (TYLENOL) tablet 650 mg  650 mg Oral Q4H PRN Florencia Reasons, MD   650 mg at 04/10/21 1703   Or   acetaminophen (TYLENOL) 160 MG/5ML solution 650 mg  650 mg Per Tube Q4H PRN Florencia Reasons, MD       Or   acetaminophen (TYLENOL) suppository 650 mg  650 mg Rectal Q4H PRN Florencia Reasons, MD       ALPRAZolam Duanne Moron) tablet 0.5  mg  0.5 mg Oral Q6H Jennette Kettle M, DO   0.5 mg at 04/12/21 0631   aspirin EC tablet 81 mg  81 mg Oral Daily Rosalin Hawking, MD   81 mg at 04/12/21 0919   atorvastatin (LIPITOR) tablet 20 mg  20 mg Oral Daily Rosalin Hawking, MD   20 mg  at 04/12/21 0919   Chlorhexidine Gluconate Cloth 2 % PADS 6 each  6 each Topical Daily Rosalin Hawking, MD   6 each at 04/11/21 1106   clopidogrel (PLAVIX) tablet 75 mg  75 mg Oral Daily Rosalin Hawking, MD   75 mg at 04/12/21 0919   enoxaparin (LOVENOX) injection 40 mg  40 mg Subcutaneous Q24H Rosalin Hawking, MD   40 mg at 04/11/21 1514   FLUoxetine (PROZAC) capsule 20 mg  20 mg Oral q morning Jennette Kettle M, DO   20 mg at 04/12/21 0919   fluticasone furoate-vilanterol (BREO ELLIPTA) 100-25 MCG/INH 1 puff  1 puff Inhalation Daily Florencia Reasons, MD   1 puff at 04/12/21 0809   And   umeclidinium bromide (INCRUSE ELLIPTA) 62.5 MCG/INH 1 puff  1 puff Inhalation Daily Florencia Reasons, MD   1 puff at 04/12/21 0809   levothyroxine (SYNTHROID) tablet 75 mcg  75 mcg Oral Q0600 Etta Quill, DO   75 mcg at 04/11/21 0547   montelukast (SINGULAIR) tablet 10 mg  10 mg Oral QHS Etta Quill, DO   10 mg at 04/11/21 2137   multivitamin with minerals tablet 1 tablet  1 tablet Oral Daily Etta Quill, DO   1 tablet at 04/12/21 [id number]   mycophenolate (CELLCEPT) capsule 1,000 mg  1,000 mg Oral BID Etta Quill, DO   1,000 mg at 04/12/21 0919   ondansetron (ZOFRAN) injection 4 mg  4 mg Intravenous Q6H PRN Rosalin Hawking, MD       oxyCODONE-acetaminophen (PERCOCET/ROXICET) 5-325 MG per tablet 1 tablet  1 tablet Oral Q6H PRN Etta Quill, DO   1 tablet at 04/10/21 1300   pantoprazole (PROTONIX) EC tablet 40 mg  40 mg Oral Daily Rosalin Hawking, MD   40 mg at 04/12/21 0919   senna-docusate (Senokot-S) tablet 1 tablet  1 tablet Oral QHS PRN Rosalin Hawking, MD       simethicone [facility]) chewable tablet 160 mg  160 mg Oral QID PRN Etta Quill, DO       traZODone (DESYREL) tablet 75 mg  75 mg Oral QHS Jennette Kettle M, DO   75 mg at 04/11/21 2139     Discharge Medications: Please see discharge summary for a list of discharge medications.  Relevant Imaging Results:  Relevant Lab Results:   Additional Information SS#  [id number];  pt has had COVID vaccine x 2 and booster x [place], LCSW

## 2021-04-12 NOTE — TOC CAGE-AID Note (Signed)
Transition of Care [facility]) - CAGE-AID Screening   Patient Details  Name: TYNLEIGH AGUSTIN MRN: [id number] Date of Birth: Aug 28, 1941  Transition of Care [facility]) CM/SW Contact:    Cameren Odwyer C Tarpley-Carter, [place] Phone Number: 04/12/2021, 3:44 PM   Clinical Narrative:  Pt participated in [place].  Pt stated she does not use substance or ETOH.  Pt was not offered resources, due to no usage of substance or ETOH.    Evone Arseneau Tarpley-Carter, MSW, LCSW-A Pronouns:  She/Her/Hers [facility]itions [facility] Care Clinical Social Worker Direct Number:  [phone] [email or web address]   CAGE-AID Screening:    Have You Ever Felt You Ought to Cut Down on Your Drinking or Drug Use?: No Have People Annoyed You By [employer] Your Drinking Or Drug Use?: No Have You Felt Bad Or Guilty About Your Drinking Or Drug Use?: No Have You Ever Had a Drink or Used Drugs First Thing In The Morning to Steady Your Nerves or to Get Rid of a Hangover?: No CAGE-AID Score: 0  Substance Abuse Education Offered: No

## 2021-04-12 NOTE — TOC Initial Note (Addendum)
Transition of Care [facility]) - Initial/Assessment Note    Patient Details  Name: CAILEY STAUFFER MRN: [id number] Date of Birth: 11/14/1940  Transition of Care [facility]) CM/SW Contact:    Benard Halsted, [place] Phone Number: 04/12/2021, 2:16 PM  Clinical Narrative:                 12pm-Patient came from [facility] for rehab. CSW spoke with patient's daughter. She is aware that [facility] no longer has a bed for patient as she did not do a bed hold. She requested CSW contact [facility] [place] for bed availability. [facility] unable to offer a bed but [facility] is able to; CSW made daughter aware. She reported she would like to try [facility] as patient has been to [facility] [place] before. CSW confirmed that [facility] is able to accept patient today. Daughter requesting [facility] for transport. No updated COVID test required per facility.   Expected Discharge Plan: Skilled Nursing Facility Barriers to Discharge: Barriers Resolved   Patient Goals and CMS Choice Patient states their goals for this hospitalization and ongoing recovery are:: Rehab CMS Medicare.gov Compare Post Acute Care list provided to:: Patient Represenative (must comment) Choice offered to / list presented to : Adult Children, Patient  Expected Discharge Plan and Services Expected Discharge Plan: [employer] In-house Referral: Clinical Social Work   Post Acute Care Choice: [place] Living arrangements for the past 2 months: [place], [place] Expected Discharge Date: 04/12/21                                    Prior Living Arrangements/Services Living arrangements for the past 2 months: [place], [place] Lives with:: Self   Do you feel safe going back to the place where you live?: Yes          Current home services: DME    Activities of Daily Living Home Assistive Devices/Equipment: Bedside commode/3-in-1, Walker (specify type), Cane  (specify quad or straight) ADL Screening (condition at time of admission) Patient's cognitive ability adequate to safely complete daily activities?: Yes Is the patient deaf or have difficulty hearing?: No Does the patient have difficulty seeing, even when wearing glasses/contacts?: No Does the patient have difficulty concentrating, remembering, or making decisions?: No Patient able to express need for assistance with ADLs?: Yes Does the patient have difficulty dressing or bathing?: No Independently performs ADLs?: No Does the patient have difficulty walking or climbing stairs?: Yes Weakness of Legs: Right Weakness of Arms/Hands: None  Permission Sought/Granted Permission sought to share information with : Customer service manager                Emotional Assessment              Admission diagnosis:  Aphasia [R47.01] Stroke (cerebrum) ([place]) [I63.9] Acute ischemic stroke ([place]) [I63.9] Right sided weakness [R53.1] Patient Active Problem List   Diagnosis Date Noted   Transient ischemic attack 04/12/2021   Acute ischemic stroke ([place]) 04/09/2021   Fracture, proximal femur, right, closed, initial encounter ([place])    Hyponatremia 03/24/2021   Hypokalemia 03/24/2021   Anxiety 03/24/2021   Hip fracture ([place]) 03/23/2021   Unilateral primary osteoarthritis, left knee 11/26/2017   Lumbar stenosis 06/18/2015   Essential hypertension 02/20/2014   Bradycardia 02/20/2014   Swelling of left lower extremity 02/20/2014   Rotator cuff arthropathy 10/16/2012  Carpal tunnel syndrome 10/16/2012   Carpal tunnel syndrome of right wrist 05/31/2012   Rotator cuff impingement syndrome 12/22/2011   Irreducible incisional hernia 03/15/2011   Myasthenia gravis, 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 [id number] - [address], [address] [place]  [address] Phone: [phone] Fax: [phone]     Social Determinants of Health (SDOH) Interventions    Readmission Risk Interventions No flowsheet data found.

## 2021-04-12 NOTE — Progress Notes (Signed)
Occupational Therapy Treatment Patient Details Name: Sabrina [name] MRN: [id number] DOB: [birth year] Today's Date: 04/12/2021    History of present illness Pt is an 80 y.o. female who presented 04/09/21 with AMS, visual cut, and aphasia. Pt recently had R hip Fx and surgical repair 7/20-7/22.  Pt discharged to SNF. CT:16 mL chronic area of ischemia in the left occipital and posterior parietal lobes. MRI: no new areas of infarct. PMH: Myasthenia Gravis, HTN, HLD, cerebral aneurysm s/p coiling.   OT comments  Pt is aware of NWB R LE and able to sustain during session. Pt agreeable to SNF level rehab in community and prefer to go back to [facility] at d/c. Pt positioned up for grooming task in chair. Pt excited to have lunch in chair this afternoon.Recommendation SNF   Follow Up Recommendations  SNF;Supervision/Assistance - 24 hour    Equipment Recommendations  None recommended by OT    Recommendations for Other Services      Precautions / Restrictions Precautions Precautions: Fall Precaution Comments: 1 other fall in the past year Restrictions Weight Bearing Restrictions: Yes RLE Weight Bearing: Non weight bearing       Mobility Bed Mobility Overal bed mobility: Needs Assistance Bed Mobility: Supine to Sit Rolling: Mod assist   Supine to sit: Mod assist     General bed mobility comments: pt able to progress bil LE toward EOB but unable to elevate truck off surface without (A). (A) of pad to scoot L hip out to eob static sitting    Transfers Overall transfer level: Needs assistance   Transfers: Sit to/from Stand Sit to Stand: Mod assist;From elevated surface         General transfer comment: pt was able to sustain static standing NWB R LE with bar in stedy. Pt positioned in stedy and then in chair. Pt sustained NWB R LE during transfer.    Balance                                           ADL either performed or assessed with clinical judgement    ADL Overall ADL's : Needs assistance/impaired Eating/Feeding: Independent;Sitting   Grooming: Modified independent;Sitting Grooming Details (indicate cue type and reason): applying chapstick during session Upper Body Bathing: Set up;Sitting   Lower Body Bathing: Moderate assistance;Sit to/from stand                         General ADL Comments: Pt progressed from bed to chair this session. pt unaware that it is after 11am at this time. pt states "i am weaker in the morning"     Vision       Perception     Praxis      Cognition Arousal/Alertness: Awake/alert Behavior During Therapy: WFL for tasks assessed/performed Overall Cognitive Status: Impaired/Different from baseline                       Memory: Decreased short-term memory         General Comments: Ox4 and able to verbalize precautions NWB R LE and sustain. Pt asking similar questions several times during session and pt was repeating answer back to demonstrate hearing the answer        Exercises     Shoulder Instructions       General Comments VSS  Pertinent Vitals/ Pain       Pain Assessment: No/denies pain  Home Living                                          Prior Functioning/Environment              Frequency  Min 2X/week        Progress Toward Goals  OT Goals(current goals can now be found in the care plan section)  Progress towards OT goals: Progressing toward goals  Acute Rehab OT Goals Patient Stated Goal: to go back to rehab [facility]" OT Goal Formulation: With patient Time For Goal Achievement: 04/26/21 Potential to Achieve Goals: Good ADL Goals Pt Will Perform Lower Body Bathing: with modified independence;with adaptive equipment;sit to/from stand Pt Will Perform Lower Body Dressing: with modified independence;with adaptive equipment;sit to/from stand Pt Will Transfer to Toilet: with modified independence;ambulating;bedside  commode Pt Will Perform Toileting - Clothing Manipulation and hygiene: with modified independence;sit to/from stand Additional ADL Goal #1: Pt will be Mod I in and OOB for basic ADLs  Plan Discharge plan remains appropriate    Co-evaluation                 AM-PAC OT "6 Clicks" Daily Activity     Outcome Measure   Help from another person eating meals?: None Help from another person taking care of personal grooming?: None Help from another person toileting, which includes using toliet, bedpan, or urinal?: A Little Help from another person bathing (including washing, rinsing, drying)?: A Little Help from another person to put on and taking off regular upper body clothing?: A Little Help from another person to put on and taking off regular lower body clothing?: A Lot 6 Click Score: 19    End of Session Equipment Utilized During Treatment: Gait belt  OT Visit Diagnosis: Unsteadiness on feet (R26.81);Other abnormalities of gait and mobility (R26.89);History of falling (Z91.81);Pain Pain - Right/Left: Right Pain - part of body: Hip   Activity Tolerance Patient tolerated treatment well   Patient Left in chair;with call bell/phone within reach;with family/visitor present   Nurse Communication Mobility status        Time: [id number] OT Time Calculation (min): 19 min  Charges: OT General Charges $OT Visit: 1 Visit OT Treatments $Self Care/Home Management : 8-22 mins   Brynn, OTR/L  Acute Rehabilitation Services Pager: [phone] Office: [phone] .    Jeri Modena 04/12/2021, 11:23 AM

## 2021-04-12 NOTE — Progress Notes (Addendum)
Report called to [employer] L LPN at [facility], spoke with pt's daughter and patient who are in agreement for discharge. No ss of distress noted, no changes in assessments. Reviewed d/c instructions with pt and LPN at receiving facility.

## 2021-04-12 NOTE — Discharge Summary (Addendum)
Stroke Discharge Summary  Patient ID: RHUNETTE MOESSNER       MRN: [id number]      DOB: 09-06-1940  Date of Admission: 04/09/2021 Date of Discharge: 04/12/2021  Attending Physician:  Rosalin Hawking, MD Consultant(s):   None  Patient's PCP:  Ginger Organ., MD  DISCHARGE DIAGNOSIS:  Principal Problem:   Transient ischemic attack Active Problems:   Essential hypertension   Myasthenia gravis, 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[place])   Hip fracture ([place])   Acute ischemic stroke ([place])   Allergies as of 04/12/2021       Reactions   Azathioprine Anaphylaxis, Other (See Comments)   Kidneys shut down   Other Anaphylaxis   Sodium pentathol Uncoded Allergy. Allergen: Sodium pentathol   Aminoglycosides    Tobramycin, gentamycin, kanamycin, neomycin, streptomycin Can't take because of myasthenia gravis   Avelox [moxifloxacin Hcl In Nacl] Other (See Comments)   Can't take because of myasthenia gravis   Beta Adrenergic Blockers    Proponolol, timolol maleate eyedrops Can't take because of myasthenia gravis   Calcium Channel Blockers    Blood pressure medications Can't take because of myasthenia gravis   Cephalosporins    Can't take because of myasthenia gravis   Ciprofloxacin Other (See Comments)   Can't take because of myasthenia gravis Can't take because of myasthenia gravis   Clindamycin/lincomycin    May exascerbate myasthenia gravis   Colistin    Can't take because of myasthenia gravis   Fenofibrate    Other reaction(s): GI upset, myalgia   Fosamax [alendronate Sodium]    LEG WEAKNESS   Imuran [azathioprine Sodium] Other (See Comments)   Kidneys shut down   Iodinated Diagnostic Agents Other (See Comments)   Contraindicated to Myasthenia Gravis   Iodine    Iodine contrast Can't take because of myasthenia gravis   Macrolides And Ketolides    Erythromocin, azithromycin, telithromycin Can't take because of myasthenia gravis   Magnesium-containing Compounds    Including milk of magnesia,  antacids containing magnesium hydroxide (maalox, mylanta) and epsom salts Can't take because of myasthenia gravis   Moxifloxacin Other (See Comments)   Can't take because of myasthenia gravis   Niacin And Related    Other reaction(s): leg aches   Norfloxacin Other (See Comments)   Can't take because of myasthenia gravis Can't take because of myasthenia gravis   Ofloxacin Other (See Comments)   Can't take because of myasthenia gravis Can't take because of myasthenia gravis   Pefloxacin Other (See Comments)   Can't take because of myasthenia gravis Can't take because of myasthenia gravis   Penicillamine    do not use due to Myasthenia Gravis   Prednisolone Nausea And Vomiting   Prednisone Nausea And Vomiting   Procainamide    Can't take because of myasthenia gravis   Quinidine    Can't take because of myasthenia gravis   Quinine Derivatives    Can't take because of myasthenia gravis   Succinylcholine    Can't take because of myasthenia gravis No paralytics   Tubocurarine    Can't take because of myasthenia gravis   Vecuronium    Can't take because of myasthenia gravis No paralytics   Vicodin [hydrocodone-acetaminophen] Other (See Comments)   Pt suffers from bad headaches        Medication List     STOP taking these medications    amoxicillin 500 MG capsule Commonly known as: AMOXIL   Ibuprofen-diphenhydrAMINE Cit 200-38 MG Tabs  TAKE these medications    ALPRAZolam 0.5 MG tablet Commonly known as: XANAX Take 1 tablet (0.5 mg total) by mouth every 6 (six) hours.   aspirin 81 MG EC tablet Take 1 tablet (81 mg total) by mouth daily. Swallow whole. Start taking on: April 13, 2021   atorvastatin 20 MG tablet Commonly known as: LIPITOR Take 1 tablet (20 mg total) by mouth daily. Start taking on: April 13, 2021   Centrum Silver tablet Take 1 tablet by mouth daily.   clopidogrel 75 MG tablet Commonly known as: PLAVIX Take 1 tablet (75 mg total) by  mouth daily for 21 days.   diclofenac Sodium 1 % Gel Commonly known as: VOLTAREN Apply 2 g topically every 6 (six) hours as needed (pain).   enoxaparin 40 MG/0.4ML injection Commonly known as: LOVENOX Inject 0.4 mLs (40 mg total) into the skin daily.   FLUoxetine 20 MG tablet Commonly known as: PROZAC Take 20 mg by mouth every morning.   fluticasone 50 MCG/ACT nasal spray Commonly known as: FLONASE Place 2 sprays into both nostrils daily.   guaiFENesin DM 400-20 MG Tabs Take 1 tablet by mouth 3 (three) times daily.   LACTAID PO Take 1 tablet by mouth daily as needed (before dairy).   levothyroxine 75 MCG tablet Commonly known as: SYNTHROID Take 75 mcg by mouth every morning. MUST TAKE BRAND NAME SYNTHROID   montelukast 10 MG tablet Commonly known as: SINGULAIR Take 10 mg by mouth at bedtime. MUST BE BRAND NAME ONLY-SINGULAIR   mycophenolate 500 MG tablet Commonly known as: CELLCEPT Take 1,000 mg by mouth 2 (two) times daily.   Omega 3 1200 MG Caps Take 1,200 mg by mouth daily.   traZODone 150 MG tablet Commonly known as: DESYREL Take 75 mg by mouth at bedtime.   Trelegy Ellipta 200-62.5-25 MCG/INH Aepb Generic drug: Fluticasone-Umeclidin-Vilant Inhale 1 puff into the lungs daily.   valsartan 160 MG tablet Commonly known as: Diovan Take 1 tablet (160 mg total) by mouth daily.   vitamin C 1000 MG tablet Take 1,000 mg by mouth every morning.   VITAMIN D-3 PO Take 1,000 mg by mouth every morning.       ASK your doctor about these medications    oxyCODONE-acetaminophen 5-325 MG tablet Commonly known as: PERCOCET/ROXICET Take 1 tablet by mouth 4 (four) times daily as needed for severe pain.   simethicone 80 MG chewable tablet Commonly known as: MYLICON Chew 2 tablets (160 mg total) by mouth 4 (four) times daily as needed for flatulence.       SIGNIFICANT DIAGNOSTIC STUDIES 04/09/21 CT Head  1. No acute intracranial abnormality. 2. ASPECTS is  10.  04/09/21 CTA Head and Neck  IMPRESSION: 1. No emergent large vessel occlusion or high-grade stenosis of the intracranial arteries. 2. Coil mass at the anterior communicating artery. 3. 16 mL area of ischemia in the left occipital and posterior parietal lobes.  04/10/21 CT Head  IMPRESSION: 1. No evidence of acute intracranial abnormality. 2. ASPECTS is 10.   04/10/21 CTA Head and Neck  IMPRESSION: 1. No emergent large vessel occlusion. 2. Mild intracranial atherosclerosis without significant proximal stenosis. 3. Widely patent cervical carotid and vertebral arteries. 4. Previous anterior communicating artery aneurysm coiling. 5. Negative CTP.  04/10/21 MRI Brain WO Contrast  1. No acute intracranial abnormality. 2. Findings of chronic small vessel ischemia and generalized volume loss.  04/11/21 MRI Brain WO Contrast 1. No acute intracranial abnormality or significant interval change. 2. Stable atrophy  and white matter disease. This likely reflects the sequela of chronic microvascular ischemia. 3. 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  04/11/21 Echocardiogram   1. Left ventricular ejection fraction, by estimation, is 60 to 65%. The left ventricle has normal function. The left ventricle has no regional wall motion abnormalities. There is mild left ventricular hypertrophy of the basal-septal segment. Left ventricular diastolic parameters are consistent with Grade I diastolic dysfunction (impaired relaxation).   2. Right ventricular systolic function is normal. The right ventricular size is normal. Tricuspid regurgitation signal is inadequate for assessing PA pressure.   3. The mitral valve is normal in structure. No evidence of mitral valve regurgitation. No evidence of mitral stenosis.   4. The aortic valve was not well visualized. Aortic valve regurgitation is mild. No aortic stenosis is present.   5. The inferior vena cava is normal in size with greater than 50%  respiratory variability, suggesting right atrial pressure of 3 mmHg.   History of Present Illness  ZOELYNN PICKNEY is a 80 y.o. female with a history of hypertension, hyperlipidemia who was in her normal state of health earlier today.  She spoke with someone around 2 PM and at that time she was already having some difficulty with speaking.  She subsequently called her daughter around 5:45 PM and asked her to come over because she stated that something was "not right."  Her daughter found her to have significant difficulty speaking, and it is at that point that she was brought into the emergency department.  After evaluation by Dr. Darl Householder, he determined that she appeared aphasic and activated a code stroke.  LKW: Unclear, abnormal at 2 PM tpa given?: no, outside of window    Discharge Examination   Constitutional: NAD Respiratory: Unlabored respirations  Cardiac: RRR  Neurological Exam: MS: AAOx4, following commands CN: EOMI, VFF, Face symmetric, Tongue midline, Shoulder shrug intact  Motor: Normal bulk and tone. Antigravity throughout with no drift  Sensation: Intact to light touch throughout  Coordination: FNF intact Gait: Deferred given recent right hip fracture   Assessment and Plan:  Ms. GLADIE FOGAL is a 80 y.o. 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 Stroke code was initiated upon arrival- she was outside of the time window for IVTPA. During the stroke code her exam was pertinent for a right visual field cut and aphasia. CTA head and neck showed no LVO but CTP showed perfusion deficit at left MCA. However, MRI was negative. Her symptoms resolved. The next day she developed headache and aphasia thus was given IVTPA on 04/10/21.    #TIA versus Complex Migraine  Differential diagnoses at this time include transient ischemic attack,  versus complex migraine versus minor stroke that is not showing on MRI. Both MRI Brains this admission were negative for an acute stroke. For stroke prevention she was placed on DAPT for 21 days followed by Aspirin monotherapy. Please view stroke work up below:  Code Stroke CT head 8/6 - No acute abnormality. Atrophy. ASPECTS 10. CTA head & neck with perfusion - No emergent large vessel occlusion or high-grade stenosis of the intracranial arteries. Coil mass at the [facility]. 16 mL area of ischemia in the left occipital and posterior parietal lobes. MRI 8/6 No acute intracranial abnormality. Code stroke CT head 8/7 - no acute finding CTA head and neck and CTP 8/7 -  negative MRI 8/8 negative for acute intracranial findings  2D Echo w/EF 60 to 65 %, LA normal in size  EEG normal with no epileptogenic potentials  LDL 92 HgbA1c 5.2, at goal < 7  VTE prophylaxis - SCDs Stroke treatment: DAPT for 21 days followed by Aspirin monotherapy  Therapy recommendations: SNF   #Stroke Dysphagia Screening Passed bedside swallow evaluation for a heart healthy diet    #Hypertension Currently blood pressure is fluctuating trending 130-160 range  Blood pressure goal normotensive at discharge    #Hyperlipidemia LDL 92, higher than goal < 70 Lipitor '20mg'$  initiated, no high intensity due to advanced age and LDL not far from goal Continue statin at discharge  #Myasthenia Gravis On cellcept for several years without recent flares per patient report Follows w/[facility]. 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 Erlinda Hong   35 minutes were spent preparing discharge.  ATTENDING NOTE: I reviewed above note and agree with the assessment and plan. Pt was  seen and examined.   No family at bedside.  Patient doing well, no acute event overnight.  Denies any headache, neuro intact except right lower extremity weakness due to right hip fracture 3 weeks ago.  Currently still nonweightbearing.  MRI no acute infarct, patient symptoms likely due to TIA versus complicated migraine.  We will continue DAPT for 3 weeks and then aspirin alone.  Continue Lipitor and CellCept.  Ready to discharge to SNF.  She will follow-up with Dr. Gerrit Heck at neurology at [facility].  For detailed assessment and plan, please refer to above as I have made changes wherever appropriate.   Rosalin Hawking, MD PhD Stroke Neurology 04/12/2021 6:05 PM

## 2021-04-13 ENCOUNTER — Other Ambulatory Visit: Payer: Self-pay

## 2021-04-13 DIAGNOSIS — M810 Age-related osteoporosis without current pathological fracture: Secondary | ICD-10-CM | POA: Diagnosis not present

## 2021-04-13 DIAGNOSIS — G459 Transient cerebral ischemic attack, unspecified: Secondary | ICD-10-CM | POA: Diagnosis not present

## 2021-04-13 DIAGNOSIS — Z9181 History of falling: Secondary | ICD-10-CM | POA: Diagnosis not present

## 2021-04-13 DIAGNOSIS — Z743 Need for continuous supervision: Secondary | ICD-10-CM | POA: Diagnosis not present

## 2021-04-13 DIAGNOSIS — E039 Hypothyroidism, unspecified: Secondary | ICD-10-CM | POA: Diagnosis not present

## 2021-04-13 DIAGNOSIS — E782 Mixed hyperlipidemia: Secondary | ICD-10-CM | POA: Diagnosis not present

## 2021-04-13 DIAGNOSIS — Z20822 Contact with and (suspected) exposure to covid-19: Secondary | ICD-10-CM | POA: Diagnosis not present

## 2021-04-13 DIAGNOSIS — M48061 Spinal stenosis, lumbar region without neurogenic claudication: Secondary | ICD-10-CM | POA: Diagnosis not present

## 2021-04-13 DIAGNOSIS — R531 Weakness: Secondary | ICD-10-CM | POA: Diagnosis not present

## 2021-04-13 DIAGNOSIS — M6281 Muscle weakness (generalized): Secondary | ICD-10-CM | POA: Diagnosis not present

## 2021-04-13 DIAGNOSIS — I10 Essential (primary) hypertension: Secondary | ICD-10-CM | POA: Diagnosis not present

## 2021-04-13 DIAGNOSIS — Z8673 Personal history of transient ischemic attack (TIA), and cerebral infarction without residual deficits: Secondary | ICD-10-CM | POA: Diagnosis not present

## 2021-04-13 DIAGNOSIS — R41841 Cognitive communication deficit: Secondary | ICD-10-CM | POA: Diagnosis not present

## 2021-04-13 DIAGNOSIS — R5381 Other malaise: Secondary | ICD-10-CM | POA: Diagnosis not present

## 2021-04-13 DIAGNOSIS — R262 Difficulty in walking, not elsewhere classified: Secondary | ICD-10-CM | POA: Diagnosis not present

## 2021-04-13 DIAGNOSIS — J449 Chronic obstructive pulmonary disease, unspecified: Secondary | ICD-10-CM | POA: Diagnosis not present

## 2021-04-13 DIAGNOSIS — I69351 Hemiplegia and hemiparesis following cerebral infarction affecting right dominant side: Secondary | ICD-10-CM | POA: Diagnosis not present

## 2021-04-13 DIAGNOSIS — M1990 Unspecified osteoarthritis, unspecified site: Secondary | ICD-10-CM | POA: Diagnosis not present

## 2021-04-13 DIAGNOSIS — G7000 Myasthenia gravis without (acute) exacerbation: Secondary | ICD-10-CM | POA: Diagnosis not present

## 2021-04-13 DIAGNOSIS — S72001D Fracture of unspecified part of neck of right femur, subsequent encounter for closed fracture with routine healing: Secondary | ICD-10-CM | POA: Diagnosis not present

## 2021-04-13 DIAGNOSIS — I69828 Other speech and language deficits following other cerebrovascular disease: Secondary | ICD-10-CM | POA: Diagnosis not present

## 2021-04-13 DIAGNOSIS — R0902 Hypoxemia: Secondary | ICD-10-CM | POA: Diagnosis not present

## 2021-04-13 DIAGNOSIS — E785 Hyperlipidemia, unspecified: Secondary | ICD-10-CM | POA: Diagnosis not present

## 2021-04-13 DIAGNOSIS — M8580 Other specified disorders of bone density and structure, unspecified site: Secondary | ICD-10-CM | POA: Diagnosis not present

## 2021-04-13 DIAGNOSIS — I69328 Other speech and language deficits following cerebral infarction: Secondary | ICD-10-CM | POA: Diagnosis not present

## 2021-04-13 DIAGNOSIS — I959 Hypotension, unspecified: Secondary | ICD-10-CM | POA: Diagnosis not present

## 2021-04-13 DIAGNOSIS — R2681 Unsteadiness on feet: Secondary | ICD-10-CM | POA: Diagnosis not present

## 2021-04-13 NOTE — Patient Outreach (Signed)
[facility]) Care Management  04/13/2021  [name] 24-Jul-1941 [id number]   [facility] [ACO] Patient: Medicare CMS DCE  Patient to be followed by [place] Management PAC with traditional Medicare for any known or needs for transitional care needs for returning to post facility care or complex disease management.  Sent message regarding SNF for rehab.  For questions or referrals, please contact:   Natividad Brood, RN BSN [facility] Liaison  [phone] business mobile phone Toll free office [phone]  Fax number: [phone] [name]

## 2021-04-13 NOTE — Progress Notes (Signed)
Pt transferred to [facility] stretcher without difficulty. Belongings with pt; glasses, ceramic angel and oral care items.

## 2021-04-14 ENCOUNTER — Encounter: Payer: Medicare Other | Admitting: Orthopaedic Surgery

## 2021-04-19 ENCOUNTER — Other Ambulatory Visit: Payer: Self-pay | Admitting: *Deleted

## 2021-04-19 NOTE — Patient Outreach (Signed)
Member screened for potential [name] care coordination needs. [name] resides in [name].   Update received from [name] indicating member's transition plan is to return home with daughter. Ortho follow up appointment is scheduled for the first week for September. Will know more at that time.   Member's PCP has [facility] embedded care coordination team at the practice. Writer will continue to follow while in SNF for potential care coordination needs.   Sabrina Rolling, MSN, RN,BSN [place] Acute Care Coordinator [phone] [facility]) [phone]  (Toll free office)

## 2021-04-21 ENCOUNTER — Telehealth: Payer: Self-pay | Admitting: Physician Assistant

## 2021-04-21 DIAGNOSIS — S72001D Fracture of unspecified part of neck of right femur, subsequent encounter for closed fracture with routine healing: Secondary | ICD-10-CM | POA: Diagnosis not present

## 2021-04-21 DIAGNOSIS — G7000 Myasthenia gravis without (acute) exacerbation: Secondary | ICD-10-CM | POA: Diagnosis not present

## 2021-04-21 DIAGNOSIS — R5381 Other malaise: Secondary | ICD-10-CM | POA: Diagnosis not present

## 2021-04-21 DIAGNOSIS — Z8673 Personal history of transient ischemic attack (TIA), and cerebral infarction without residual deficits: Secondary | ICD-10-CM | POA: Diagnosis not present

## 2021-04-21 NOTE — Telephone Encounter (Signed)
Pt's daughter calling wanting a call back from the nurse to ask a few questions about medication. Facility provider has stopped them and she is not sure what she needs to do to get them started again. The best call back number [phone].

## 2021-04-21 NOTE — Telephone Encounter (Signed)
Pts daughter called and would like a call back regarding Sabrina [name]'s medication.

## 2021-04-21 NOTE — Telephone Encounter (Signed)
LMOM for patient letting her know I was returning her call and to call me back of she still has any questions

## 2021-04-22 NOTE — Telephone Encounter (Signed)
LMOM for daughter stating  I was returning her call Told her when she calls back to leave message details so if I keep missing her I can call her back with answers

## 2021-04-26 ENCOUNTER — Ambulatory Visit: Payer: Medicare Other | Admitting: Podiatry

## 2021-05-04 DIAGNOSIS — E039 Hypothyroidism, unspecified: Secondary | ICD-10-CM | POA: Diagnosis not present

## 2021-05-04 DIAGNOSIS — E782 Mixed hyperlipidemia: Secondary | ICD-10-CM | POA: Diagnosis not present

## 2021-05-04 DIAGNOSIS — I10 Essential (primary) hypertension: Secondary | ICD-10-CM | POA: Diagnosis not present

## 2021-05-05 ENCOUNTER — Other Ambulatory Visit: Payer: Self-pay

## 2021-05-05 ENCOUNTER — Ambulatory Visit: Payer: Self-pay

## 2021-05-05 ENCOUNTER — Ambulatory Visit (INDEPENDENT_AMBULATORY_CARE_PROVIDER_SITE_OTHER): Payer: Medicare Other | Admitting: Physician Assistant

## 2021-05-05 ENCOUNTER — Encounter: Payer: Self-pay | Admitting: Physician Assistant

## 2021-05-05 ENCOUNTER — Encounter: Payer: Medicare Other | Admitting: Physician Assistant

## 2021-05-05 DIAGNOSIS — S72001A Fracture of unspecified part of neck of right femur, initial encounter for closed fracture: Secondary | ICD-10-CM

## 2021-05-05 MED ORDER — OXYCODONE-ACETAMINOPHEN 5-325 MG PO TABS: 1.0000 | ORAL_TABLET | Freq: Four times a day (QID) | ORAL | 0 refills | Status: DC | PRN

## 2021-05-05 NOTE — Progress Notes (Signed)
HPI: Mrs. Blankinship returns today for follow-up of her right femur periprosthetic fracture.  She is now approximately 6 weeks status post injury.  She states it really does not hurt much.  She has been nonweightbearing on the right lower extremity.  She has no complaints.  Physical exam: General well-developed well-nourished pleasant female in no acute distress seated in wheelchair Right hip: General range of motion no significant pain.  Radiographs: Right hip 2 views: Hips well located.  Good consolidation about the periprosthetic fracture.  There is no evidence of loosening or hardware failure.  Impression: Right proximal femur periprosthetic fracture  Plan: She is weightbearing as tolerated on the right lower extremity.  She will work with therapy on gait balance strengthening.  See her back in 1 month for repeat x-rays.  Daughter did ask for a grab bar for shower: Shower chair and a transport chair prescription was given.  Questions were encouraged and answered at length

## 2021-05-10 ENCOUNTER — Other Ambulatory Visit: Payer: Self-pay | Admitting: *Deleted

## 2021-05-10 NOTE — Patient Outreach (Signed)
[facility] Post- Acute Care Coordinator follow up. Per [place] (Patient Sabrina [name]) member resides in [facility].   Communication sent to [facility] to inquire about about transition plans.   Will continue to follow.   Marthenia Rolling, MSN, RN,BSN [place] Acute Care Coordinator [phone] [facility]) [phone]  (Toll free office)

## 2021-05-12 ENCOUNTER — Telehealth: Payer: Self-pay | Admitting: Orthopaedic Surgery

## 2021-05-12 DIAGNOSIS — F419 Anxiety disorder, unspecified: Secondary | ICD-10-CM | POA: Diagnosis not present

## 2021-05-12 DIAGNOSIS — I10 Essential (primary) hypertension: Secondary | ICD-10-CM | POA: Diagnosis not present

## 2021-05-12 DIAGNOSIS — J45909 Unspecified asthma, uncomplicated: Secondary | ICD-10-CM | POA: Diagnosis not present

## 2021-05-12 DIAGNOSIS — Z791 Long term (current) use of non-steroidal anti-inflammatories (NSAID): Secondary | ICD-10-CM | POA: Diagnosis not present

## 2021-05-12 DIAGNOSIS — Z7951 Long term (current) use of inhaled steroids: Secondary | ICD-10-CM | POA: Diagnosis not present

## 2021-05-12 DIAGNOSIS — M80051D Age-related osteoporosis with current pathological fracture, right femur, subsequent encounter for fracture with routine healing: Secondary | ICD-10-CM | POA: Diagnosis not present

## 2021-05-12 DIAGNOSIS — E785 Hyperlipidemia, unspecified: Secondary | ICD-10-CM | POA: Diagnosis not present

## 2021-05-12 DIAGNOSIS — Z9181 History of falling: Secondary | ICD-10-CM | POA: Diagnosis not present

## 2021-05-12 DIAGNOSIS — E039 Hypothyroidism, unspecified: Secondary | ICD-10-CM | POA: Diagnosis not present

## 2021-05-12 DIAGNOSIS — G7000 Myasthenia gravis without (acute) exacerbation: Secondary | ICD-10-CM | POA: Diagnosis not present

## 2021-05-12 DIAGNOSIS — Z8673 Personal history of transient ischemic attack (TIA), and cerebral infarction without residual deficits: Secondary | ICD-10-CM | POA: Diagnosis not present

## 2021-05-12 NOTE — Telephone Encounter (Signed)
Last 2 ov notes faxed to [facility]

## 2021-05-13 ENCOUNTER — Other Ambulatory Visit: Payer: Self-pay | Admitting: *Deleted

## 2021-05-13 NOTE — Patient Outreach (Signed)
[place] Coordinator follow up. Member screened for potential [facility] care coordination needs.   Update received [facility] indicating Ms. Guimond transitioned home with daughter on 05/10/21. [facility] was arranged.   Telephone call made to [facility] daughters Salvadore Oxford [phone] and Lianne Bushy [phone]. No answer. HIPAA compliant voicemail messages to request return call.    Marthenia Rolling, MSN, RN,BSN [place] Acute Care Coordinator [phone] [facility]) [phone]  (Toll free office)

## 2021-05-16 DIAGNOSIS — J45909 Unspecified asthma, uncomplicated: Secondary | ICD-10-CM | POA: Diagnosis not present

## 2021-05-16 DIAGNOSIS — E039 Hypothyroidism, unspecified: Secondary | ICD-10-CM | POA: Diagnosis not present

## 2021-05-16 DIAGNOSIS — M80051D Age-related osteoporosis with current pathological fracture, right femur, subsequent encounter for fracture with routine healing: Secondary | ICD-10-CM | POA: Diagnosis not present

## 2021-05-16 DIAGNOSIS — G7000 Myasthenia gravis without (acute) exacerbation: Secondary | ICD-10-CM | POA: Diagnosis not present

## 2021-05-16 DIAGNOSIS — I10 Essential (primary) hypertension: Secondary | ICD-10-CM | POA: Diagnosis not present

## 2021-05-16 DIAGNOSIS — E785 Hyperlipidemia, unspecified: Secondary | ICD-10-CM | POA: Diagnosis not present

## 2021-05-24 DIAGNOSIS — J45909 Unspecified asthma, uncomplicated: Secondary | ICD-10-CM | POA: Diagnosis not present

## 2021-05-24 DIAGNOSIS — M80051D Age-related osteoporosis with current pathological fracture, right femur, subsequent encounter for fracture with routine healing: Secondary | ICD-10-CM | POA: Diagnosis not present

## 2021-05-24 DIAGNOSIS — I10 Essential (primary) hypertension: Secondary | ICD-10-CM | POA: Diagnosis not present

## 2021-05-24 DIAGNOSIS — G7000 Myasthenia gravis without (acute) exacerbation: Secondary | ICD-10-CM | POA: Diagnosis not present

## 2021-05-24 DIAGNOSIS — E039 Hypothyroidism, unspecified: Secondary | ICD-10-CM | POA: Diagnosis not present

## 2021-05-24 DIAGNOSIS — E785 Hyperlipidemia, unspecified: Secondary | ICD-10-CM | POA: Diagnosis not present

## 2021-05-26 DIAGNOSIS — G7000 Myasthenia gravis without (acute) exacerbation: Secondary | ICD-10-CM | POA: Diagnosis not present

## 2021-05-26 DIAGNOSIS — S72001A Fracture of unspecified part of neck of right femur, initial encounter for closed fracture: Secondary | ICD-10-CM | POA: Diagnosis not present

## 2021-05-26 DIAGNOSIS — M810 Age-related osteoporosis without current pathological fracture: Secondary | ICD-10-CM | POA: Diagnosis not present

## 2021-05-26 DIAGNOSIS — Z8673 Personal history of transient ischemic attack (TIA), and cerebral infarction without residual deficits: Secondary | ICD-10-CM | POA: Diagnosis not present

## 2021-05-26 DIAGNOSIS — I10 Essential (primary) hypertension: Secondary | ICD-10-CM | POA: Diagnosis not present

## 2021-05-26 DIAGNOSIS — E782 Mixed hyperlipidemia: Secondary | ICD-10-CM | POA: Diagnosis not present

## 2021-05-27 DIAGNOSIS — I10 Essential (primary) hypertension: Secondary | ICD-10-CM | POA: Diagnosis not present

## 2021-05-27 DIAGNOSIS — G7000 Myasthenia gravis without (acute) exacerbation: Secondary | ICD-10-CM | POA: Diagnosis not present

## 2021-05-27 DIAGNOSIS — M80051D Age-related osteoporosis with current pathological fracture, right femur, subsequent encounter for fracture with routine healing: Secondary | ICD-10-CM | POA: Diagnosis not present

## 2021-05-27 DIAGNOSIS — E785 Hyperlipidemia, unspecified: Secondary | ICD-10-CM | POA: Diagnosis not present

## 2021-05-27 DIAGNOSIS — J45909 Unspecified asthma, uncomplicated: Secondary | ICD-10-CM | POA: Diagnosis not present

## 2021-05-27 DIAGNOSIS — E039 Hypothyroidism, unspecified: Secondary | ICD-10-CM | POA: Diagnosis not present

## 2021-05-31 DIAGNOSIS — E785 Hyperlipidemia, unspecified: Secondary | ICD-10-CM | POA: Diagnosis not present

## 2021-05-31 DIAGNOSIS — M80051D Age-related osteoporosis with current pathological fracture, right femur, subsequent encounter for fracture with routine healing: Secondary | ICD-10-CM | POA: Diagnosis not present

## 2021-05-31 DIAGNOSIS — G7000 Myasthenia gravis without (acute) exacerbation: Secondary | ICD-10-CM | POA: Diagnosis not present

## 2021-05-31 DIAGNOSIS — E039 Hypothyroidism, unspecified: Secondary | ICD-10-CM | POA: Diagnosis not present

## 2021-05-31 DIAGNOSIS — J45909 Unspecified asthma, uncomplicated: Secondary | ICD-10-CM | POA: Diagnosis not present

## 2021-05-31 DIAGNOSIS — I10 Essential (primary) hypertension: Secondary | ICD-10-CM | POA: Diagnosis not present

## 2021-06-01 DIAGNOSIS — E785 Hyperlipidemia, unspecified: Secondary | ICD-10-CM | POA: Diagnosis not present

## 2021-06-01 DIAGNOSIS — J45909 Unspecified asthma, uncomplicated: Secondary | ICD-10-CM | POA: Diagnosis not present

## 2021-06-01 DIAGNOSIS — G7000 Myasthenia gravis without (acute) exacerbation: Secondary | ICD-10-CM | POA: Diagnosis not present

## 2021-06-01 DIAGNOSIS — I10 Essential (primary) hypertension: Secondary | ICD-10-CM | POA: Diagnosis not present

## 2021-06-01 DIAGNOSIS — M80051D Age-related osteoporosis with current pathological fracture, right femur, subsequent encounter for fracture with routine healing: Secondary | ICD-10-CM | POA: Diagnosis not present

## 2021-06-01 DIAGNOSIS — E039 Hypothyroidism, unspecified: Secondary | ICD-10-CM | POA: Diagnosis not present

## 2021-06-02 DIAGNOSIS — M80051D Age-related osteoporosis with current pathological fracture, right femur, subsequent encounter for fracture with routine healing: Secondary | ICD-10-CM | POA: Diagnosis not present

## 2021-06-02 DIAGNOSIS — E039 Hypothyroidism, unspecified: Secondary | ICD-10-CM | POA: Diagnosis not present

## 2021-06-02 DIAGNOSIS — J45909 Unspecified asthma, uncomplicated: Secondary | ICD-10-CM | POA: Diagnosis not present

## 2021-06-02 DIAGNOSIS — I10 Essential (primary) hypertension: Secondary | ICD-10-CM | POA: Diagnosis not present

## 2021-06-02 DIAGNOSIS — E785 Hyperlipidemia, unspecified: Secondary | ICD-10-CM | POA: Diagnosis not present

## 2021-06-02 DIAGNOSIS — G7000 Myasthenia gravis without (acute) exacerbation: Secondary | ICD-10-CM | POA: Diagnosis not present

## 2021-06-07 DIAGNOSIS — E039 Hypothyroidism, unspecified: Secondary | ICD-10-CM | POA: Diagnosis not present

## 2021-06-07 DIAGNOSIS — G7000 Myasthenia gravis without (acute) exacerbation: Secondary | ICD-10-CM | POA: Diagnosis not present

## 2021-06-07 DIAGNOSIS — I10 Essential (primary) hypertension: Secondary | ICD-10-CM | POA: Diagnosis not present

## 2021-06-07 DIAGNOSIS — E785 Hyperlipidemia, unspecified: Secondary | ICD-10-CM | POA: Diagnosis not present

## 2021-06-07 DIAGNOSIS — J45909 Unspecified asthma, uncomplicated: Secondary | ICD-10-CM | POA: Diagnosis not present

## 2021-06-07 DIAGNOSIS — M80051D Age-related osteoporosis with current pathological fracture, right femur, subsequent encounter for fracture with routine healing: Secondary | ICD-10-CM | POA: Diagnosis not present

## 2021-06-11 DIAGNOSIS — Z791 Long term (current) use of non-steroidal anti-inflammatories (NSAID): Secondary | ICD-10-CM | POA: Diagnosis not present

## 2021-06-11 DIAGNOSIS — Z8673 Personal history of transient ischemic attack (TIA), and cerebral infarction without residual deficits: Secondary | ICD-10-CM | POA: Diagnosis not present

## 2021-06-11 DIAGNOSIS — E785 Hyperlipidemia, unspecified: Secondary | ICD-10-CM | POA: Diagnosis not present

## 2021-06-11 DIAGNOSIS — M80051D Age-related osteoporosis with current pathological fracture, right femur, subsequent encounter for fracture with routine healing: Secondary | ICD-10-CM | POA: Diagnosis not present

## 2021-06-11 DIAGNOSIS — Z7951 Long term (current) use of inhaled steroids: Secondary | ICD-10-CM | POA: Diagnosis not present

## 2021-06-11 DIAGNOSIS — Z9181 History of falling: Secondary | ICD-10-CM | POA: Diagnosis not present

## 2021-06-11 DIAGNOSIS — G7000 Myasthenia gravis without (acute) exacerbation: Secondary | ICD-10-CM | POA: Diagnosis not present

## 2021-06-11 DIAGNOSIS — E039 Hypothyroidism, unspecified: Secondary | ICD-10-CM | POA: Diagnosis not present

## 2021-06-11 DIAGNOSIS — I10 Essential (primary) hypertension: Secondary | ICD-10-CM | POA: Diagnosis not present

## 2021-06-11 DIAGNOSIS — J45909 Unspecified asthma, uncomplicated: Secondary | ICD-10-CM | POA: Diagnosis not present

## 2021-06-11 DIAGNOSIS — F419 Anxiety disorder, unspecified: Secondary | ICD-10-CM | POA: Diagnosis not present

## 2021-06-14 DIAGNOSIS — I10 Essential (primary) hypertension: Secondary | ICD-10-CM | POA: Diagnosis not present

## 2021-06-14 DIAGNOSIS — E039 Hypothyroidism, unspecified: Secondary | ICD-10-CM | POA: Diagnosis not present

## 2021-06-14 DIAGNOSIS — J45909 Unspecified asthma, uncomplicated: Secondary | ICD-10-CM | POA: Diagnosis not present

## 2021-06-14 DIAGNOSIS — M80051D Age-related osteoporosis with current pathological fracture, right femur, subsequent encounter for fracture with routine healing: Secondary | ICD-10-CM | POA: Diagnosis not present

## 2021-06-14 DIAGNOSIS — G7000 Myasthenia gravis without (acute) exacerbation: Secondary | ICD-10-CM | POA: Diagnosis not present

## 2021-06-14 DIAGNOSIS — E785 Hyperlipidemia, unspecified: Secondary | ICD-10-CM | POA: Diagnosis not present

## 2021-06-17 DIAGNOSIS — E785 Hyperlipidemia, unspecified: Secondary | ICD-10-CM | POA: Diagnosis not present

## 2021-06-17 DIAGNOSIS — J45909 Unspecified asthma, uncomplicated: Secondary | ICD-10-CM | POA: Diagnosis not present

## 2021-06-17 DIAGNOSIS — I10 Essential (primary) hypertension: Secondary | ICD-10-CM | POA: Diagnosis not present

## 2021-06-17 DIAGNOSIS — G7000 Myasthenia gravis without (acute) exacerbation: Secondary | ICD-10-CM | POA: Diagnosis not present

## 2021-06-17 DIAGNOSIS — E039 Hypothyroidism, unspecified: Secondary | ICD-10-CM | POA: Diagnosis not present

## 2021-06-17 DIAGNOSIS — M80051D Age-related osteoporosis with current pathological fracture, right femur, subsequent encounter for fracture with routine healing: Secondary | ICD-10-CM | POA: Diagnosis not present

## 2021-06-22 ENCOUNTER — Ambulatory Visit: Payer: Medicare Other | Admitting: Orthopaedic Surgery

## 2021-06-24 DIAGNOSIS — I10 Essential (primary) hypertension: Secondary | ICD-10-CM | POA: Diagnosis not present

## 2021-06-24 DIAGNOSIS — E039 Hypothyroidism, unspecified: Secondary | ICD-10-CM | POA: Diagnosis not present

## 2021-06-24 DIAGNOSIS — G7000 Myasthenia gravis without (acute) exacerbation: Secondary | ICD-10-CM | POA: Diagnosis not present

## 2021-06-24 DIAGNOSIS — M80051D Age-related osteoporosis with current pathological fracture, right femur, subsequent encounter for fracture with routine healing: Secondary | ICD-10-CM | POA: Diagnosis not present

## 2021-06-24 DIAGNOSIS — J45909 Unspecified asthma, uncomplicated: Secondary | ICD-10-CM | POA: Diagnosis not present

## 2021-06-24 DIAGNOSIS — E785 Hyperlipidemia, unspecified: Secondary | ICD-10-CM | POA: Diagnosis not present

## 2021-06-27 DIAGNOSIS — Z23 Encounter for immunization: Secondary | ICD-10-CM | POA: Diagnosis not present

## 2021-06-28 DIAGNOSIS — E039 Hypothyroidism, unspecified: Secondary | ICD-10-CM | POA: Diagnosis not present

## 2021-06-28 DIAGNOSIS — J45909 Unspecified asthma, uncomplicated: Secondary | ICD-10-CM | POA: Diagnosis not present

## 2021-06-28 DIAGNOSIS — I10 Essential (primary) hypertension: Secondary | ICD-10-CM | POA: Diagnosis not present

## 2021-06-28 DIAGNOSIS — G7000 Myasthenia gravis without (acute) exacerbation: Secondary | ICD-10-CM | POA: Diagnosis not present

## 2021-06-28 DIAGNOSIS — E785 Hyperlipidemia, unspecified: Secondary | ICD-10-CM | POA: Diagnosis not present

## 2021-06-28 DIAGNOSIS — M80051D Age-related osteoporosis with current pathological fracture, right femur, subsequent encounter for fracture with routine healing: Secondary | ICD-10-CM | POA: Diagnosis not present

## 2021-07-06 ENCOUNTER — Ambulatory Visit: Payer: Self-pay

## 2021-07-06 ENCOUNTER — Ambulatory Visit (INDEPENDENT_AMBULATORY_CARE_PROVIDER_SITE_OTHER): Payer: Medicare Other | Admitting: Physician Assistant

## 2021-07-06 ENCOUNTER — Other Ambulatory Visit: Payer: Self-pay

## 2021-07-06 ENCOUNTER — Encounter: Payer: Self-pay | Admitting: Physician Assistant

## 2021-07-06 DIAGNOSIS — S72001A Fracture of unspecified part of neck of right femur, initial encounter for closed fracture: Secondary | ICD-10-CM | POA: Diagnosis not present

## 2021-07-06 DIAGNOSIS — M25561 Pain in right knee: Secondary | ICD-10-CM

## 2021-07-06 DIAGNOSIS — G8929 Other chronic pain: Secondary | ICD-10-CM | POA: Diagnosis not present

## 2021-07-06 MED ORDER — METHYLPREDNISOLONE ACETATE 40 MG/ML IJ SUSP
40.0000 mg | INTRAMUSCULAR | Status: AC | PRN
  Administered 2021-07-06: 40 mg via INTRA_ARTICULAR

## 2021-07-06 MED ORDER — LIDOCAINE HCL 1 % IJ SOLN
3.0000 mL | INTRAMUSCULAR | Status: AC | PRN
  Administered 2021-07-06: 3 mL

## 2021-07-06 NOTE — Progress Notes (Signed)
HPI: Mrs. Sabrina [name] returns today for her right periprosthetic femur fracture.  She states that hip is no longer bothering her.  Main complaint today is right knee pain.  She states she is independent at home and is doing well as long as she has a Radiation protection practitioner.  No new injuries since she was last seen.  She does wear knee brace at times.  Takes naproxen twice daily for the knee pain and occasional Percocet due to knee pain.  Also uses Voltaren gel on the knee.  Review of systems: Negative for fevers or chills.  See HPI otherwise negative  Physical exam: Right hip good range of motion. Right knee overall good range of motion.  Tenderness along medial joint line no abnormal warmth erythema or effusion.  No Zelman instability valgus varus stress.  Radiographs:  Right hip well located.  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  Periprosthetic fracture appears to have excellent consolidation.  No other bony abnormalities or acute fractures noted.  Right knee 2 views: Tricompartmental arthritis right knee with near bone-on-bone lateral compartment.  Severe patellofemoral joint arthritic changes.  Mild to moderate medial compartmental narrowing.  No acute fractures or bony abnormalities.  Procedure Note  Patient: Sabrina [name]             Date of Birth: 1941/05/30           MRN: [id number]             Visit Date: 07/06/2021  Procedures: Visit Diagnoses:  1. Chronic pain of right knee   2. Fracture, proximal femur, right, closed, initial encounter [facility])     Large Joint Inj: R knee on 07/06/2021 4:42 PM Indications: pain Details: 22 G 1.5 in needle, anterolateral approach  Arthrogram: No  Medications: 3 mL lidocaine 1 %; 40 mg methylPREDNISolone acetate 40 MG/ML Outcome: tolerated well, no immediate complications Procedure, treatment alternatives, risks and benefits explained, specific risks discussed. Consent was given by the patient. Immediately prior to procedure a time  out was called to verify the correct patient, procedure, equipment, support staff and site/side marked as required. Patient was prepped and draped in the usual sterile fashion.     Plan: Follow up in 2 weeks to see how she responded to the cortisone injection.  She may benefit from supplemental injections in the future if she is asking about possible supplemental injections.  Questions were encouraged and answered at length.

## 2021-07-07 DIAGNOSIS — M80051D Age-related osteoporosis with current pathological fracture, right femur, subsequent encounter for fracture with routine healing: Secondary | ICD-10-CM | POA: Diagnosis not present

## 2021-07-07 DIAGNOSIS — G7000 Myasthenia gravis without (acute) exacerbation: Secondary | ICD-10-CM | POA: Diagnosis not present

## 2021-07-07 DIAGNOSIS — J45909 Unspecified asthma, uncomplicated: Secondary | ICD-10-CM | POA: Diagnosis not present

## 2021-07-07 DIAGNOSIS — E785 Hyperlipidemia, unspecified: Secondary | ICD-10-CM | POA: Diagnosis not present

## 2021-07-07 DIAGNOSIS — I10 Essential (primary) hypertension: Secondary | ICD-10-CM | POA: Diagnosis not present

## 2021-07-07 DIAGNOSIS — E039 Hypothyroidism, unspecified: Secondary | ICD-10-CM | POA: Diagnosis not present

## 2021-07-20 ENCOUNTER — Other Ambulatory Visit: Payer: Self-pay

## 2021-07-20 ENCOUNTER — Ambulatory Visit (INDEPENDENT_AMBULATORY_CARE_PROVIDER_SITE_OTHER): Payer: Medicare Other | Admitting: Physician Assistant

## 2021-07-20 ENCOUNTER — Encounter: Payer: Self-pay | Admitting: Physician Assistant

## 2021-07-20 DIAGNOSIS — G8929 Other chronic pain: Secondary | ICD-10-CM | POA: Diagnosis not present

## 2021-07-20 DIAGNOSIS — M25561 Pain in right knee: Secondary | ICD-10-CM

## 2021-07-20 NOTE — Progress Notes (Signed)
HPI: Ms. Sabrina [name] returns today for follow-up of her right knee injection on 07/06/2021.  She states that he is doing well.  She states she tolerated the cortisone well without any hyperactivity.  She is having no significant pain in the right knee at this point in time.  She is also well recovered from the right hip periprosthetic fracture.  She is walking with a Rollator and states she still feels her gait and balance are off.  Review of systems see HPI otherwise negative  Physical exam: General well-developed well-nourished female no acute distress. Psych: Alert and oriented x3  Right hip fluid range of motion without pain. Bilateral knees full range of motion both knees without significant pain.  Patellofemoral crepitus bilaterally.  Impression: Right knee tricompartmental arthritis  Plan: She understands to wait at least 3 months between cortisone injections.  She may benefit from supplemental injection in future.  She will continue to work on strengthening both knees doing quad exercises and discussed knee friendly exercises with her today.  We will see her back on an as-needed basis.  Also discussed with her referral to formal physical therapy to work on gait and balance.  She would like to think about this and let us know.  Questions were encouraged and answered

## 2021-07-20 NOTE — Patient Outreach (Signed)
[facility]) Care Management  07/20/2021  Sabrina [name] 1941-08-22 [id number]   First telephone outreach attempt to obtain mRS. No answer. Left message for returned call.  Philmore Pali  [facility] Management Assistant [phone]

## 2021-07-22 ENCOUNTER — Other Ambulatory Visit: Payer: Self-pay

## 2021-07-22 NOTE — Patient Outreach (Signed)
[facility]) Care Management  07/22/2021  Sabrina [name] 10/16/1940 [id number]   Second telephone outreach attempt to obtain mRS. No answer. Left message for returned call.  Philmore Pali [facility] Management Assistant [phone]

## 2021-07-25 ENCOUNTER — Other Ambulatory Visit: Payer: Self-pay

## 2021-07-25 NOTE — Patient Outreach (Signed)
[facility]) Care Management  07/25/2021  Sabrina [name] 12/08/1940 [id number]   3 outreach attempts were completed to obtain mRs. mRs could not be obtained because patient never returned my calls. mRs=7    [place] Management Assistant [phone]

## 2021-08-02 DIAGNOSIS — G7000 Myasthenia gravis without (acute) exacerbation: Secondary | ICD-10-CM | POA: Diagnosis not present

## 2021-08-20 IMAGING — MR MR HEAD W/O CM
5 of 6 series · 38 of 48 positions shown · non-contrast
Comparison: None.

CLINICAL DATA: Stroke, follow-up.  Status post tPA.

EXAM:
MRI HEAD WITHOUT CONTRAST
TECHNIQUE: Multiplanar, multiecho pulse sequences of the brain and surrounding
structures were obtained without intravenous contrast.

[Series 4: DWI · coronal · 5.0mm · 1.09mm/px · 10 of 74 slices shown (1 of 3)]
[im 5/74]
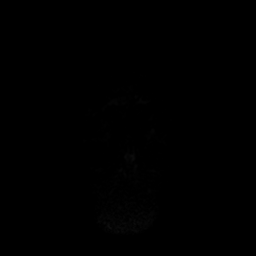
[im 10/74]
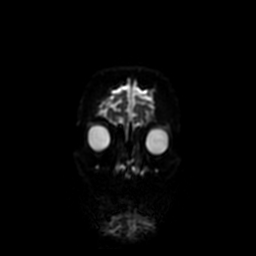
[im 15/74]
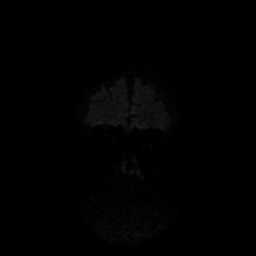
[im 25/74]
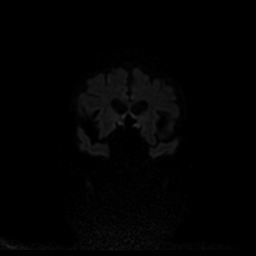
[im 35/74]
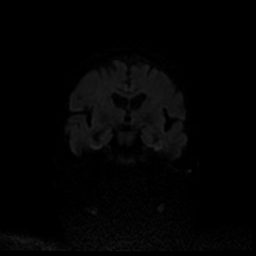
[im 39/74]
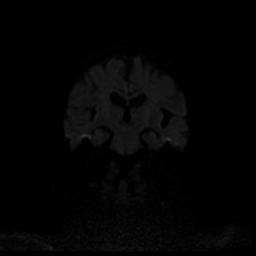
[im 44/74]
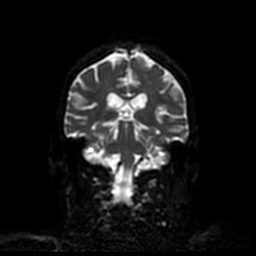
[im 54/74]
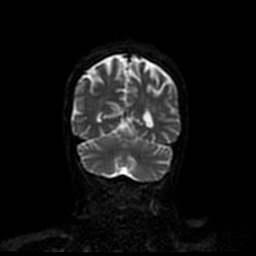
[im 64/74]
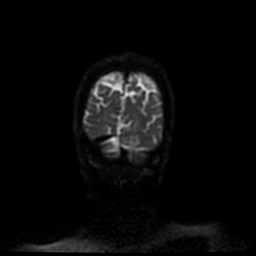
[im 74/74]
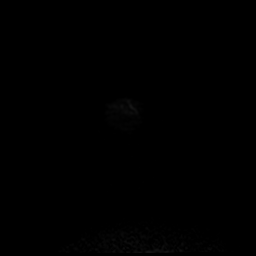

[Series 5: T1 · sagittal · 5.0mm · 0.47mm/px · 5 of 23 slices shown]
[im 1/23]
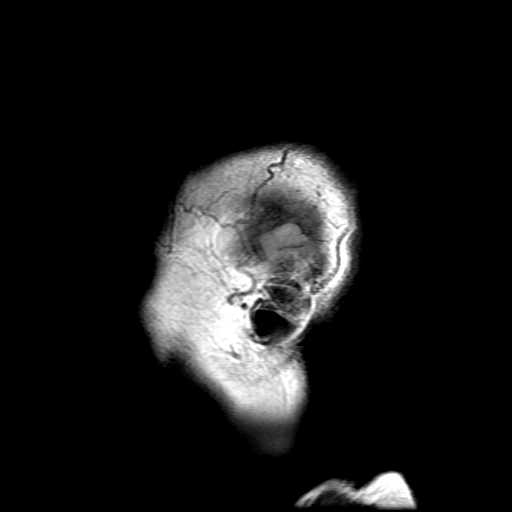
[im 6/23]
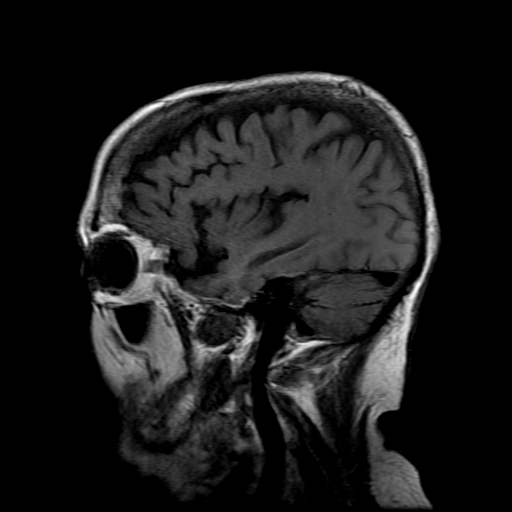
[im 12/23]
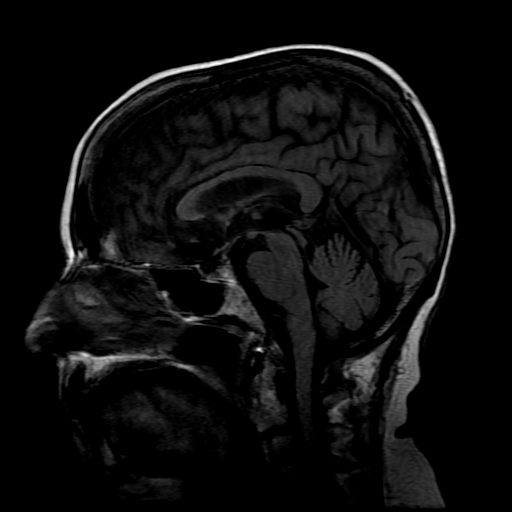
[im 17/23]
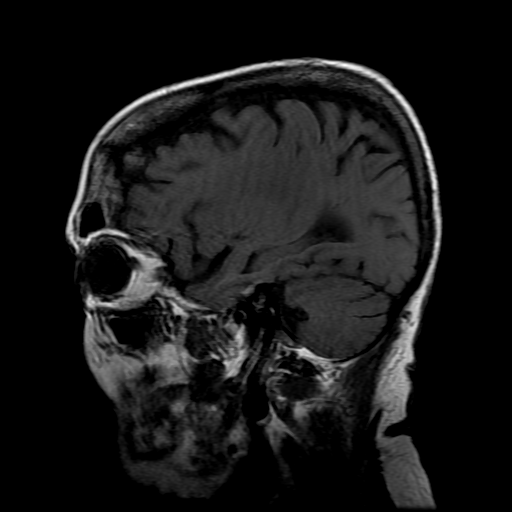
[im 23/23]
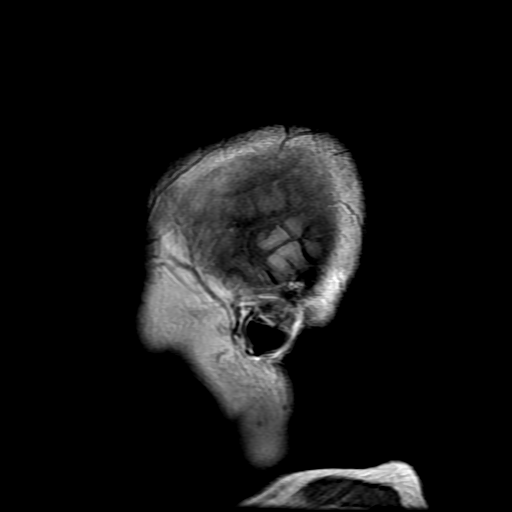

[Series 7: FLAIR · axial · 3.0mm · 0.43mm/px · z∈[-74,+74]mm · 5 of 26 slices shown]
[im 1/26]
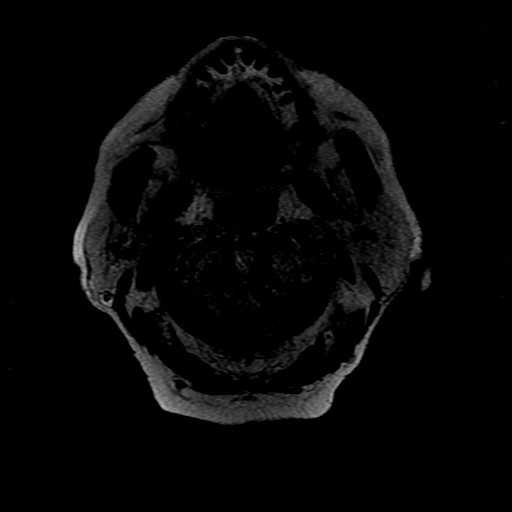
[im 7/26]
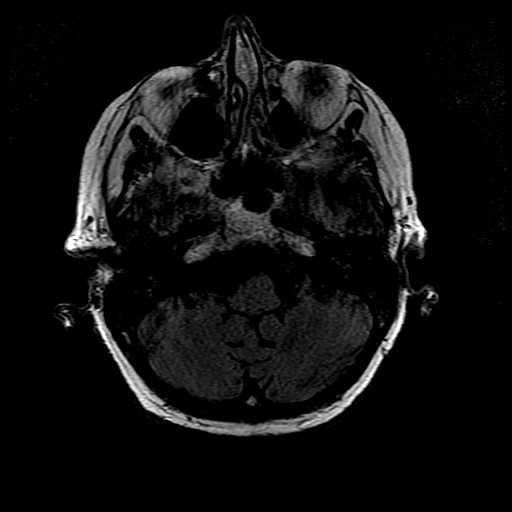
[im 13/26]
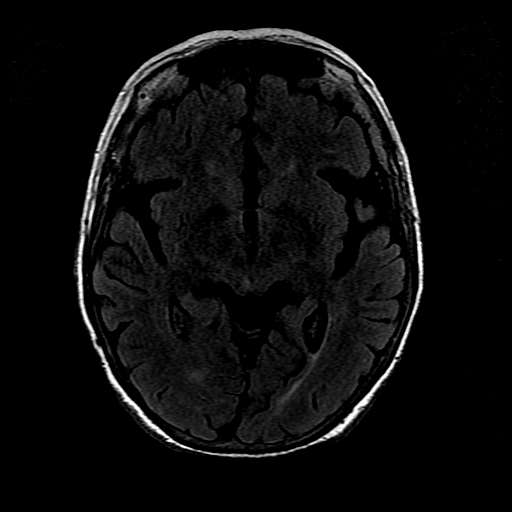
[im 19/26]
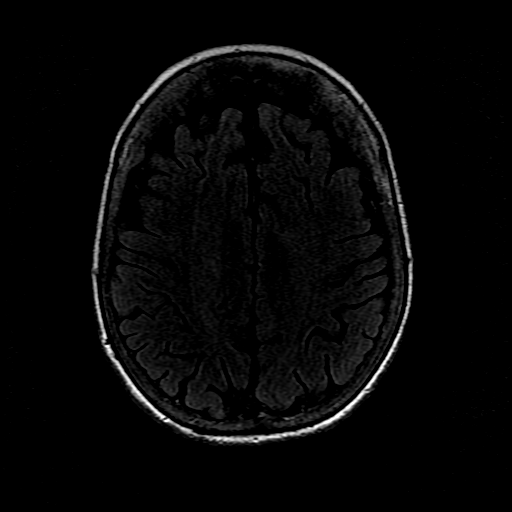
[im 26/26]
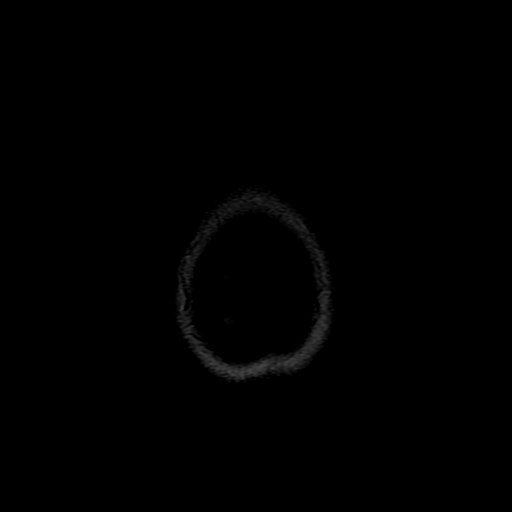

[Series 300: DWI · axial · 3.0mm · 1.09mm/px · z∈[-84,+75]mm · 11 of 55 slices shown (2 of 3)]
[im 1/55]
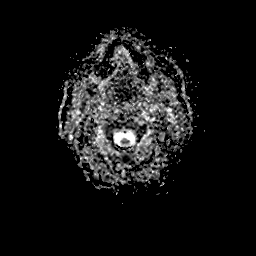
[im 6/55]
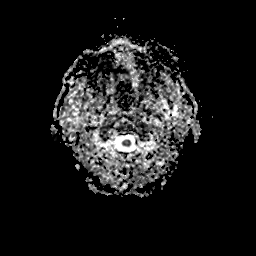
[im 11/55]
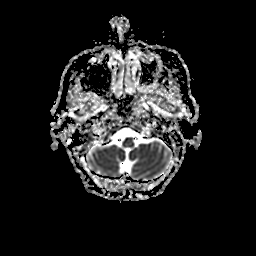
[im 17/55]
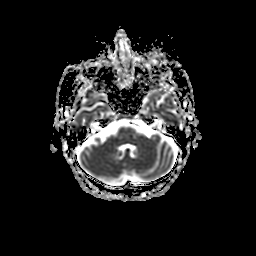
[im 22/55]
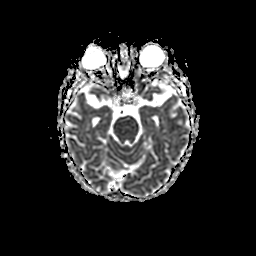
[im 28/55]
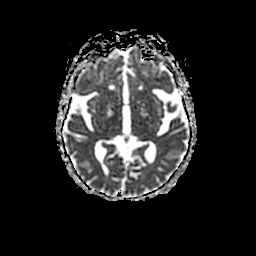
[im 33/55]
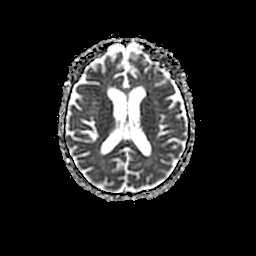
[im 38/55]
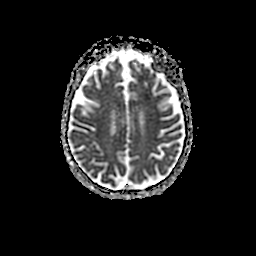
[im 44/55]
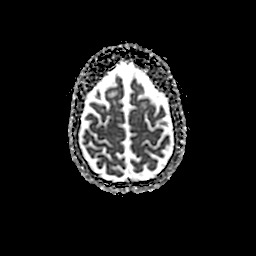
[im 49/55]
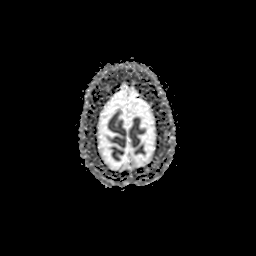
[im 55/55]
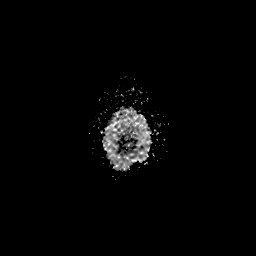

[Series 400: DWI · coronal · 5.0mm · 1.09mm/px · 7 of 36 slices shown (3 of 3)]
[im 1/36]
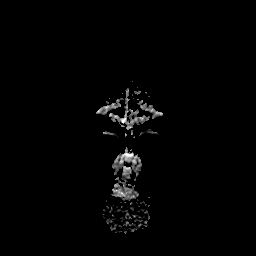
[im 6/36]
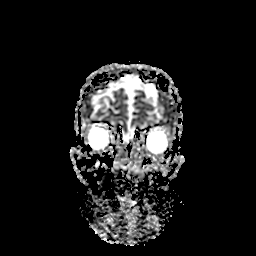
[im 12/36]
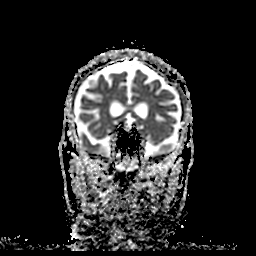
[im 18/36]
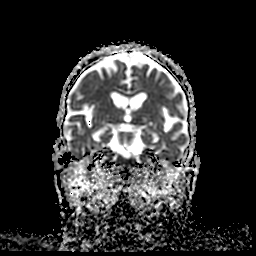
[im 24/36]
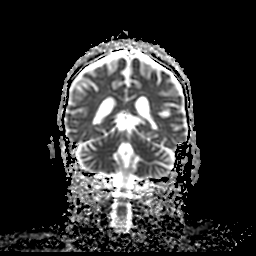
[im 30/36]
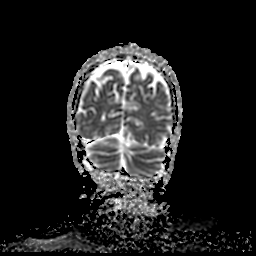
[im 36/36]
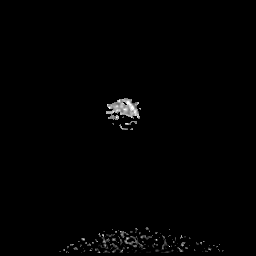

[38 of 48 positions shown; findings below may reference images not displayed]

FINDINGS: Brain: No acute infarct, hemorrhage, or mass lesion is present.
There is less motion on today's study than previously seen. Mild
atrophy and white matter changes are again noted.

The ventricles are of normal size. No significant extraaxial fluid
collection is present.

Vascular: Flow is present in the major intracranial arteries.

Skull and upper cervical spine: Mild degenerative changes in the
cervical spine again seen with slight degenerative anterolisthesis
at C2-3, C3-4 and C4-5. Vertebral body heights are normal. Marrow
signal is normal.

Sinuses/Orbits: Circumferential mucosal thickening and slight
decrease in size on the left suggests a history of chronic
sinusitis. No active fluid levels are present. The paranasal sinuses
and mastoid air cells are otherwise clear. Bilateral lens
replacements are noted. Globes and orbits are otherwise
unremarkable.
IMPRESSION: 1. No acute intracranial abnormality or significant interval change.
2. Stable atrophy and white matter disease. This likely reflects the
sequela of chronic microvascular ischemia.
3. Decreased size of chronic left maxillary sinus disease.

## 2021-10-12 DIAGNOSIS — M25511 Pain in right shoulder: Secondary | ICD-10-CM | POA: Diagnosis not present

## 2021-10-12 DIAGNOSIS — M67911 Unspecified disorder of synovium and tendon, right shoulder: Secondary | ICD-10-CM | POA: Diagnosis not present

## 2022-01-04 ENCOUNTER — Telehealth: Payer: Self-pay | Admitting: Orthopaedic Surgery

## 2022-01-04 NOTE — Telephone Encounter (Signed)
Scheduled

## 2022-01-04 NOTE — Telephone Encounter (Signed)
Needs an appointment before the 16th as the Daughter is having surgery  ? ?Can we work this out for Knee injections ?

## 2022-01-05 ENCOUNTER — Ambulatory Visit: Payer: Medicare Other | Admitting: Physician Assistant

## 2022-01-09 ENCOUNTER — Telehealth: Payer: Self-pay | Admitting: Orthopaedic Surgery

## 2022-01-09 NOTE — Telephone Encounter (Signed)
Can we work the patient in for an injection before 5-22?? ? ? ?Call peggy at [phone] ?

## 2022-01-10 NOTE — Telephone Encounter (Signed)
Will see Sabrina [name] tomorrow at 2pm ?

## 2022-01-11 ENCOUNTER — Ambulatory Visit (INDEPENDENT_AMBULATORY_CARE_PROVIDER_SITE_OTHER): Payer: Medicare Other | Admitting: Surgery

## 2022-01-11 ENCOUNTER — Other Ambulatory Visit: Payer: Self-pay | Admitting: Orthopaedic Surgery

## 2022-01-11 ENCOUNTER — Ambulatory Visit: Payer: Self-pay

## 2022-01-11 ENCOUNTER — Telehealth: Payer: Self-pay | Admitting: Radiology

## 2022-01-11 ENCOUNTER — Encounter: Payer: Self-pay | Admitting: Surgery

## 2022-01-11 VITALS — BP 179/71 | HR 70 | Ht 65.0 in | Wt 178.0 lb

## 2022-01-11 DIAGNOSIS — G8929 Other chronic pain: Secondary | ICD-10-CM

## 2022-01-11 DIAGNOSIS — M25561 Pain in right knee: Secondary | ICD-10-CM

## 2022-01-11 DIAGNOSIS — M1711 Unilateral primary osteoarthritis, right knee: Secondary | ICD-10-CM | POA: Diagnosis not present

## 2022-01-11 MED ORDER — OXYCODONE-ACETAMINOPHEN 5-325 MG PO TABS: 1.0000 | ORAL_TABLET | Freq: Three times a day (TID) | ORAL | 0 refills | Status: DC | PRN

## 2022-01-11 NOTE — Telephone Encounter (Signed)
Patient came in to see Jeneen Rinks today for knee injection. She and her daughter states that they only want the same things Artis Delay has done before. James injected the right knee as Artis Delay did. Patient's daughter asked that he refill the Oxycodone that [place] prescribed for her previously. Jeneen Rinks was not comfortable sending this in and told her we would send to Dr. Ninfa Linden to advise. ? ?Patient has also had right shoulder problems and has been seeing Dr. Tamera Punt. She would like to be able to come here and get her right shoulder injected. She states that she knows Dr. Ninfa Linden does not do shoulder surgery, but since she is not a surgical candidate, would like to know if he would treat her with injections. ? ?Could you please advise on medication refill and whether she can be seen for shoulder? ?

## 2022-01-11 NOTE — Telephone Encounter (Signed)
Pt. Was called and informed and stated understanding. She wants to cb for appt. ?

## 2022-01-11 NOTE — Progress Notes (Signed)
Office Visit Note   Patient: Sabrina [name]           Date of Birth: [birth year]           MRN: [id number] Visit Date: 01/11/2022              Requested by: Ginger Organ., MD [address] [place],  [place] [address] PCP: Ginger Organ., MD   Assessment & Plan: Visit Diagnoses:  1. Chronic pain of right knee   2. Unilateral primary osteoarthritis, right knee     Plan: Advised patient that best option at this point would be definitive treatment with right total knee replacement.  She is adamant about not having surgery.  States that she would like to continue conservative treatment with cortisone/Marcaine injection she would like to have this done today.  She has not had viscosupplementation.  Advised patient that this could also be an option in the future and she can discuss this further with Dr. Ninfa Linden or Artis Delay.  After patient consent right knee was prepped with Betadine and intra-articular lidocaine/methylprednisolone 3-1.  Tolerated well without complication. Patient requested refill of Percocet that Artis Delay prescribes and I asked my assistant to forward that to Dr. Trevor Mace clinic.  Follow-up with Benita Stabile in 6 months.    Follow-Up Instructions: Return in about 6 months (around 07/14/2022) for [place] FOR RECHECK RIGHT KNEE.   Orders:  Orders Placed This Encounter  Procedures   XR Knee 1-2 Views Right   No orders of the defined types were placed in this encounter.     Procedures: Large Joint Inj: R knee on 01/11/2022 2:45 PM Indications: pain Details: 25 G 1.5 in needle, anteromedial approach Medications: 3 mL lidocaine 1 %; 40 mg methylPREDNISolone acetate 40 MG/ML Outcome: tolerated well, no immediate complications Consent was given by the patient. Patient was prepped and draped in the usual sterile fashion.      Clinical Data: No additional findings.   Subjective: Chief Complaint  Patient presents with   Right Knee - Pain     HPI 81 year old white female history of end-stage DJD right knee and chronic pain comes in requesting repeat knee injection.  Patient last had lidocaine/methylprednisolone injection July 06, 2021.  States that this did help with her pain.  She has not had viscosupplementation.  She states that she is not interested in having definitive treatment with right total knee replacement. Review of Systems No complaints of cardiopulmonary GI/GU issue  Objective: Vital Signs: BP (!) 179/71   Pulse 70   Ht '5\' 5"'$  (1.651 m)   Wt 178 lb (80.7 kg)   BMI 29.62 kg/m   Physical Exam Constitutional:      Appearance: She is obese.  HENT:     Head: Normocephalic and atraumatic.     Nose: Nose normal.  Eyes:     Extraocular Movements: Extraocular movements intact.  Musculoskeletal:     Comments: Gait is somewhat antalgic.  Right knee good range of motion.  Positive patellofemoral crepitus.  Medial lateral joint line tenderness.  Some swelling without large effusion.  Neurological:     Mental Status: She is alert.  Psychiatric:        Mood and Affect: Mood normal.    Ortho Exam  Specialty Comments:  No specialty comments available.  Imaging: No results found.   PMFS History: Patient Active Problem List   Diagnosis Date Noted   Transient ischemic attack 04/12/2021   Acute  ischemic stroke ([place]) 04/09/2021   Fracture, proximal femur, right, closed, initial encounter ([place])    Hyponatremia 03/24/2021   Hypokalemia 03/24/2021   Anxiety 03/24/2021   Hip fracture ([place]) 03/23/2021   Unilateral primary osteoarthritis, left knee 11/26/2017   Lumbar stenosis 06/18/2015   Essential hypertension 02/20/2014   Bradycardia 02/20/2014   Swelling of left lower extremity 02/20/2014   Rotator cuff arthropathy 10/16/2012   Carpal tunnel syndrome 10/16/2012   Carpal tunnel syndrome of right wrist 05/31/2012   Rotator cuff impingement syndrome 12/22/2011   Irreducible incisional hernia 03/15/2011    Myasthenia gravis, 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[place]) 12/03/2004   Past Medical History:  Diagnosis Date   Anxiety    Arthritis    Asthma    Avascular necrosis (HCC)    Bradycardia    symptomatic   Cancer (HCC)    COUPLE OF SKIN CANCERS   Carpal tunnel syndrome    LEFT --NUMBNESS   Cataracts, bilateral    Cerebral aneurysm    Complication of anesthesia    BP FALLS/"ANAPHYLACTIC SHOCK"/ EXTREME TREMORS;  DID  FINE WITH SURGERIES APRIL AND OCT 2013 AT [facility]--THE SURGERIES WERE SHORT PROCEDURES--PROBLEMS WITH ANESTHESIA SEEM TO OCCUR WITH THE LONGER PROCEDURES.THE TREMORS WERE AFTER HIP REPLACEMNT 22011-ARMS WERE FLAILING--PT STATES IT TOOK 4 DOSES OF DEMEROL BEFORE THE TREMORS STOPPED.   Depression    History of skin cancer    HSV (herpes simplex virus) infection    CHEST   Hyperlipidemia    Hypertension    PT HAS LOST WEIGHT--B/P NOW WITHIN NORMALS--IS ON B/P MEDICINE   Hypertriglyceridemia    Hypothyroidism    Myasthenia gravis    DOING WELL ON CELLCEPT-FOLLOWED BY DR. LISA HOBSON - [facility] NEUROLOGIST   OA (osteoarthritis) of hip    Osteopenia    Osteoporosis    Pain    LEFT SHOULDER PAIN-TORN ROTATOR CUFF-VERY LIMITED ROM   Pain    TOP OF RIGHT SHOULDER--ROM OK AT PRESENT   Pneumonia    Rash, skin    LOWER ABDOMEN-FUNGAL INFECTION-PT HAS TOPICAL OINTMENT TO USE AS NEEDED.   Seasonal allergies    Thyroid disease     Family History  Problem Relation Age of Onset   Hypertension Mother    Heart disease Mother        heart attack   Stroke Father    Heart disease Father        heart attack   Breast cancer Neg Hx     Past Surgical History:  Procedure Laterality Date   BELPHAROPTOSIS REPAIR  01/2006   PTOSIS REPAIR BILATERAL   CARPAL TUNNEL RELEASE  05/31/2012   Procedure: CARPAL TUNNEL RELEASE;  Surgeon: Mcarthur Rossetti, MD;  Location: [facility];  Service: Orthopedics;  Laterality: Left;  Left Open Carpal Tunnel Release   CARPAL TUNNEL RELEASE  10/10/2012   Procedure:  CARPAL TUNNEL RELEASE;  Surgeon: Nita Sells, MD;  Location: [facility];  Service: Orthopedics;  Laterality: Left;   CEREBRAL ANEURYSM REPAIR  10/2005   GUGLIEMI COILS TO REPAIR ANEURYSM; NO RESIDUAL PROBLEMS--HAS FOLLOW UP MRI EVERY 1 OR 2 YRS   CHOLECYSTECTOMY     EXTREME DROP IN BLOOD PRESSURE WITH THIS SURGERY- YRS AGO- PT WAS 81 YRS OLD   JOINT REPLACEMENT  10/19/09   RT HIP--SEVERE TREMORS, FLAILING OF ARMS AFTER THIS SURGERY.   left shoulder arthroscopy      REVERSE SHOULDER ARTHROPLASTY  10/10/2012   Procedure: REVERSE SHOULDER ARTHROPLASTY;  Surgeon: Nita Sells,  MD;  Location: [facility];  Service: Orthopedics;  Laterality: Left;   TONSILLECTOMY AND ADENOIDECTOMY     TOTAL HIP ARTHROPLASTY     right   TUBAL LIGATION     ANAPHYLACTIC SHOCK AFTER THIS SURGERY-CAUSE THOUGHT TO  BE RELATED TO SODIUM PENTOTHAL   Social History   Occupational History   Not on file  Tobacco Use   Smoking status: Never   Smokeless tobacco: Never  Substance and Sexual Activity   Alcohol use: Yes    Comment: OCCASIONAL   Drug use: No   Sexual activity: Not on file

## 2022-01-11 NOTE — Telephone Encounter (Signed)
Please advise 

## 2022-02-27 DIAGNOSIS — M810 Age-related osteoporosis without current pathological fracture: Secondary | ICD-10-CM | POA: Diagnosis not present

## 2022-02-27 DIAGNOSIS — E039 Hypothyroidism, unspecified: Secondary | ICD-10-CM | POA: Diagnosis not present

## 2022-02-27 DIAGNOSIS — I10 Essential (primary) hypertension: Secondary | ICD-10-CM | POA: Diagnosis not present

## 2022-02-27 DIAGNOSIS — E782 Mixed hyperlipidemia: Secondary | ICD-10-CM | POA: Diagnosis not present

## 2022-02-27 DIAGNOSIS — R7301 Impaired fasting glucose: Secondary | ICD-10-CM | POA: Diagnosis not present

## 2022-03-10 DIAGNOSIS — R7301 Impaired fasting glucose: Secondary | ICD-10-CM | POA: Diagnosis not present

## 2022-03-10 DIAGNOSIS — Z0000 Encounter for general adult medical examination without abnormal findings: Secondary | ICD-10-CM | POA: Diagnosis not present

## 2022-03-10 DIAGNOSIS — I10 Essential (primary) hypertension: Secondary | ICD-10-CM | POA: Diagnosis not present

## 2022-03-10 DIAGNOSIS — E039 Hypothyroidism, unspecified: Secondary | ICD-10-CM | POA: Diagnosis not present

## 2022-03-10 DIAGNOSIS — E781 Pure hyperglyceridemia: Secondary | ICD-10-CM | POA: Diagnosis not present

## 2022-03-10 DIAGNOSIS — G3184 Mild cognitive impairment, so stated: Secondary | ICD-10-CM | POA: Diagnosis not present

## 2022-03-10 DIAGNOSIS — E669 Obesity, unspecified: Secondary | ICD-10-CM | POA: Diagnosis not present

## 2022-03-10 DIAGNOSIS — F325 Major depressive disorder, single episode, in full remission: Secondary | ICD-10-CM | POA: Diagnosis not present

## 2022-03-10 DIAGNOSIS — M810 Age-related osteoporosis without current pathological fracture: Secondary | ICD-10-CM | POA: Diagnosis not present

## 2022-03-10 DIAGNOSIS — G7000 Myasthenia gravis without (acute) exacerbation: Secondary | ICD-10-CM | POA: Diagnosis not present

## 2022-03-10 DIAGNOSIS — D849 Immunodeficiency, unspecified: Secondary | ICD-10-CM | POA: Diagnosis not present

## 2022-03-10 DIAGNOSIS — R82998 Other abnormal findings in urine: Secondary | ICD-10-CM | POA: Diagnosis not present

## 2022-03-10 DIAGNOSIS — E782 Mixed hyperlipidemia: Secondary | ICD-10-CM | POA: Diagnosis not present

## 2022-03-13 ENCOUNTER — Other Ambulatory Visit: Payer: Self-pay | Admitting: Internal Medicine

## 2022-03-13 DIAGNOSIS — R1903 Right lower quadrant abdominal swelling, mass and lump: Secondary | ICD-10-CM

## 2022-04-03 ENCOUNTER — Ambulatory Visit (INDEPENDENT_AMBULATORY_CARE_PROVIDER_SITE_OTHER): Payer: Medicare Other | Admitting: Physician Assistant

## 2022-04-03 ENCOUNTER — Encounter: Payer: Self-pay | Admitting: Physician Assistant

## 2022-04-03 ENCOUNTER — Ambulatory Visit (INDEPENDENT_AMBULATORY_CARE_PROVIDER_SITE_OTHER): Payer: Medicare Other

## 2022-04-03 DIAGNOSIS — M25511 Pain in right shoulder: Secondary | ICD-10-CM

## 2022-04-03 DIAGNOSIS — M19011 Primary osteoarthritis, right shoulder: Secondary | ICD-10-CM | POA: Diagnosis not present

## 2022-04-03 DIAGNOSIS — M1711 Unilateral primary osteoarthritis, right knee: Secondary | ICD-10-CM

## 2022-04-03 DIAGNOSIS — G8929 Other chronic pain: Secondary | ICD-10-CM | POA: Diagnosis not present

## 2022-04-03 MED ORDER — LIDOCAINE HCL 1 % IJ SOLN
3.0000 mL | INTRAMUSCULAR | Status: AC | PRN
  Administered 2022-04-03: 3 mL

## 2022-04-03 MED ORDER — METHYLPREDNISOLONE ACETATE 40 MG/ML IJ SUSP
40.0000 mg | INTRAMUSCULAR | Status: AC | PRN
Start: 2022-04-03 — End: 2022-04-03
  Administered 2022-04-03: 40 mg via INTRA_ARTICULAR

## 2022-04-03 NOTE — Progress Notes (Signed)
Office Visit Note   Patient: Sabrina [name]           Date of Birth: 01-Aug-1941           MRN: [id number] Visit Date: 04/03/2022              Requested by: Ginger Organ., MD [address] [place],   [address] PCP: Ginger Organ., MD   Assessment & Plan: Visit Diagnoses:  1. Osteoarthritis of right shoulder, unspecified osteoarthritis type   2. Unilateral primary osteoarthritis, right knee     Plan: Discussed with patient sending her for an intra-articular injection in the right shoulder she defers.  She would like to think about this.  Also discussed possibly having her evaluated for possible right shoulder replacement if she defers.  In regards to the knee she will continue to work on strengthening.  She understands to wait at least 3 months between cortisone injections.  Questions encouraged and answered  Follow-Up Instructions: Return if symptoms worsen or fail to improve.   Orders:  Orders Placed This Encounter  Procedures   Large Joint Inj   XR Shoulder Right   No orders of the defined types were placed in this encounter.     Procedures: Large Joint Inj: R knee on 04/03/2022 5:18 PM Indications: pain Details: 22 G 1.5 in needle, anterolateral approach  Arthrogram: No  Medications: 3 mL lidocaine 1 %; 40 mg methylPREDNISolone acetate 40 MG/ML Outcome: tolerated well, no immediate complications Procedure, treatment alternatives, risks and benefits explained, specific risks discussed. Consent was given by the patient. Immediately prior to procedure a time out was called to verify the correct patient, procedure, equipment, support staff and site/side marked as required. Patient was prepped and draped in the usual sterile fashion.       Clinical Data: No additional findings.   Subjective: Chief Complaint  Patient presents with   Right Knee - Pain   Right Shoulder - Pain    HPI Sabrina [name] 81 year old female comes in today requesting cortisone  injection right knee.  Last injection 01/11/2022 states did not help.  She has known osteoarthritis of the right knee.  She has had no injury to the right knee. She also is asking about her right shoulder which she has decreased range of motion of the pain.  No known injury to the shoulder. Review of Systems See HPI  Objective: Vital Signs: There were no vitals taken for this visit.  Physical Exam Constitutional:      Appearance: She is not ill-appearing or diaphoretic.  Pulmonary:     Effort: Pulmonary effort is normal.  Neurological:     Mental Status: She is alert and oriented to person, place, and time.  Psychiatric:        Behavior: Behavior normal.     Ortho Exam Right knee: Good range of motion knee no abnormal warmth erythema or effusion. Right shoulder she has limited forward flexion actively.  Passively I can bring in 90 degrees of forward flexion.  She has limited external and internal rotation with crepitus in the right shoulder.  Attempts at range of motion right shoulder are painful. Specialty Comments:  No specialty comments available.  Imaging: XR Shoulder Right  Result Date: 04/03/2022 Right shoulder 3 views: Shoulder is well located.  End-stage bone-on-bone arthritis right shoulder.  Shoulder is high riding.  No acute fractures.    PMFS History: Patient Active Problem List   Diagnosis Date Noted  Transient ischemic attack 04/12/2021   Acute ischemic stroke ([place]) 04/09/2021   Fracture, proximal femur, right, closed, initial encounter ([place])    Hyponatremia 03/24/2021   Hypokalemia 03/24/2021   Anxiety 03/24/2021   Hip fracture ([place]) 03/23/2021   Unilateral primary osteoarthritis, left knee 11/26/2017   Lumbar stenosis 06/18/2015   Essential hypertension 02/20/2014   Bradycardia 02/20/2014   Swelling of left lower extremity 02/20/2014   Rotator cuff arthropathy 10/16/2012   Carpal tunnel syndrome 10/16/2012   Carpal tunnel syndrome of right wrist  05/31/2012   Rotator cuff impingement syndrome 12/22/2011   Irreducible incisional hernia 03/15/2011   Myasthenia gravis, 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[place]) 12/03/2004   Past Medical History:  Diagnosis Date   Anxiety    Arthritis    Asthma    Avascular necrosis (HCC)    Bradycardia    symptomatic   Cancer (HCC)    COUPLE OF SKIN CANCERS   Carpal tunnel syndrome    LEFT --NUMBNESS   Cataracts, bilateral    Cerebral aneurysm    Complication of anesthesia    BP FALLS/"ANAPHYLACTIC SHOCK"/ EXTREME TREMORS;  DID  FINE WITH SURGERIES APRIL AND OCT 2013 AT [facility]--THE SURGERIES WERE SHORT PROCEDURES--PROBLEMS WITH ANESTHESIA SEEM TO OCCUR WITH THE LONGER PROCEDURES.THE TREMORS WERE AFTER HIP REPLACEMNT 22011-ARMS WERE FLAILING--PT STATES IT TOOK 4 DOSES OF DEMEROL BEFORE THE TREMORS STOPPED.   Depression    History of skin cancer    HSV (herpes simplex virus) infection    CHEST   Hyperlipidemia    Hypertension    PT HAS LOST WEIGHT--B/P NOW WITHIN NORMALS--IS ON B/P MEDICINE   Hypertriglyceridemia    Hypothyroidism    Myasthenia gravis    DOING WELL ON CELLCEPT-FOLLOWED BY DR. LISA HOBSON - [facility] NEUROLOGIST   OA (osteoarthritis) of hip    Osteopenia    Osteoporosis    Pain    LEFT SHOULDER PAIN-TORN ROTATOR CUFF-VERY LIMITED ROM   Pain    TOP OF RIGHT SHOULDER--ROM OK AT PRESENT   Pneumonia    Rash, skin    LOWER ABDOMEN-FUNGAL INFECTION-PT HAS TOPICAL OINTMENT TO USE AS NEEDED.   Seasonal allergies    Thyroid disease     Family History  Problem Relation Age of Onset   Hypertension Mother    Heart disease Mother        heart attack   Stroke Father    Heart disease Father        heart attack   Breast cancer Neg Hx     Past Surgical History:  Procedure Laterality Date   BELPHAROPTOSIS REPAIR  01/2006   PTOSIS REPAIR BILATERAL   CARPAL TUNNEL RELEASE  05/31/2012   Procedure: CARPAL TUNNEL RELEASE;  Surgeon: Mcarthur Rossetti, MD;  Location: [facility];  Service: Orthopedics;   Laterality: Left;  Left Open Carpal Tunnel Release   CARPAL TUNNEL RELEASE  10/10/2012   Procedure: CARPAL TUNNEL RELEASE;  Surgeon: Nita Sells, MD;  Location: [facility];  Service: Orthopedics;  Laterality: Left;   CEREBRAL ANEURYSM REPAIR  10/2005   GUGLIEMI COILS TO REPAIR ANEURYSM; NO RESIDUAL PROBLEMS--HAS FOLLOW UP MRI EVERY 1 OR 2 YRS   CHOLECYSTECTOMY     EXTREME DROP IN BLOOD PRESSURE WITH THIS SURGERY- YRS AGO- PT WAS 81 YRS OLD   JOINT REPLACEMENT  10/19/09   RT HIP--SEVERE TREMORS, FLAILING OF ARMS AFTER THIS SURGERY.   left shoulder arthroscopy      REVERSE SHOULDER ARTHROPLASTY  10/10/2012   Procedure: REVERSE  SHOULDER ARTHROPLASTY;  Surgeon: Nita Sells, MD;  Location: [facility];  Service: Orthopedics;  Laterality: Left;   TONSILLECTOMY AND ADENOIDECTOMY     TOTAL HIP ARTHROPLASTY     right   TUBAL LIGATION     ANAPHYLACTIC SHOCK AFTER THIS SURGERY-CAUSE THOUGHT TO  BE RELATED TO SODIUM PENTOTHAL   Social History   Occupational History   Not on file  Tobacco Use   Smoking status: Never   Smokeless tobacco: Never  Substance and Sexual Activity   Alcohol use: Yes    Comment: OCCASIONAL   Drug use: No   Sexual activity: Not on file

## 2022-04-21 ENCOUNTER — Ambulatory Visit
Admission: RE | Admit: 2022-04-21 | Discharge: 2022-04-21 | Disposition: A | Discharge status: Home / Self Care | Payer: Medicare Other | Source: Ambulatory Visit | Attending: Internal Medicine | Admitting: Internal Medicine

## 2022-04-21 DIAGNOSIS — R1903 Right lower quadrant abdominal swelling, mass and lump: Secondary | ICD-10-CM

## 2022-04-21 DIAGNOSIS — R188 Other ascites: Secondary | ICD-10-CM | POA: Diagnosis not present

## 2022-05-04 ENCOUNTER — Telehealth: Payer: Self-pay | Admitting: *Deleted

## 2022-05-04 NOTE — Patient Outreach (Signed)
  Care Coordination   Initial Visit Note   05/04/2022 Name: Sabrina [name] MRN: [id number] DOB: 07/28/1941  Sabrina [name] is a 81 y.o. year old female who sees Brigitte Pulse, Emily Filbert., MD for primary care. I spoke with  Allena Napoleon by phone today.  What matters to the patients health and wellness today?  No concerns expressed. My daughter is a Marine scientist but this is good to know that it is available. RN discussed services [facility] services, RN, SW, and Pharmacist. Patient declined services.     Goals Addressed   None     SDOH assessments and interventions completed:  No     Care Coordination Interventions Activated:  Yes  Care Coordination Interventions:  Yes, provided   Follow up plan: No further intervention required.   Encounter Outcome:  Pt. [place] Care Management [phone]

## 2022-05-20 DIAGNOSIS — J45909 Unspecified asthma, uncomplicated: Secondary | ICD-10-CM | POA: Diagnosis not present

## 2022-05-20 DIAGNOSIS — Z8781 Personal history of (healed) traumatic fracture: Secondary | ICD-10-CM | POA: Diagnosis not present

## 2022-05-20 DIAGNOSIS — Z8673 Personal history of transient ischemic attack (TIA), and cerebral infarction without residual deficits: Secondary | ICD-10-CM | POA: Diagnosis not present

## 2022-05-20 DIAGNOSIS — N3941 Urge incontinence: Secondary | ICD-10-CM | POA: Diagnosis not present

## 2022-05-20 DIAGNOSIS — Z791 Long term (current) use of non-steroidal anti-inflammatories (NSAID): Secondary | ICD-10-CM | POA: Diagnosis not present

## 2022-05-20 DIAGNOSIS — F325 Major depressive disorder, single episode, in full remission: Secondary | ICD-10-CM | POA: Diagnosis not present

## 2022-05-20 DIAGNOSIS — R296 Repeated falls: Secondary | ICD-10-CM | POA: Diagnosis not present

## 2022-05-20 DIAGNOSIS — E039 Hypothyroidism, unspecified: Secondary | ICD-10-CM | POA: Diagnosis not present

## 2022-05-20 DIAGNOSIS — I10 Essential (primary) hypertension: Secondary | ICD-10-CM | POA: Diagnosis not present

## 2022-05-20 DIAGNOSIS — R2681 Unsteadiness on feet: Secondary | ICD-10-CM | POA: Diagnosis not present

## 2022-05-20 DIAGNOSIS — G7000 Myasthenia gravis without (acute) exacerbation: Secondary | ICD-10-CM | POA: Diagnosis not present

## 2022-05-20 DIAGNOSIS — M810 Age-related osteoporosis without current pathological fracture: Secondary | ICD-10-CM | POA: Diagnosis not present

## 2022-05-20 DIAGNOSIS — Z981 Arthrodesis status: Secondary | ICD-10-CM | POA: Diagnosis not present

## 2022-05-20 DIAGNOSIS — G4700 Insomnia, unspecified: Secondary | ICD-10-CM | POA: Diagnosis not present

## 2022-05-20 DIAGNOSIS — J309 Allergic rhinitis, unspecified: Secondary | ICD-10-CM | POA: Diagnosis not present

## 2022-05-20 DIAGNOSIS — M545 Low back pain, unspecified: Secondary | ICD-10-CM | POA: Diagnosis not present

## 2022-05-20 DIAGNOSIS — Z96641 Presence of right artificial hip joint: Secondary | ICD-10-CM | POA: Diagnosis not present

## 2022-05-20 DIAGNOSIS — E782 Mixed hyperlipidemia: Secondary | ICD-10-CM | POA: Diagnosis not present

## 2022-05-20 DIAGNOSIS — Z79899 Other long term (current) drug therapy: Secondary | ICD-10-CM | POA: Diagnosis not present

## 2022-05-26 DIAGNOSIS — G7000 Myasthenia gravis without (acute) exacerbation: Secondary | ICD-10-CM | POA: Diagnosis not present

## 2022-05-26 DIAGNOSIS — R2681 Unsteadiness on feet: Secondary | ICD-10-CM | POA: Diagnosis not present

## 2022-05-26 DIAGNOSIS — M810 Age-related osteoporosis without current pathological fracture: Secondary | ICD-10-CM | POA: Diagnosis not present

## 2022-05-26 DIAGNOSIS — I10 Essential (primary) hypertension: Secondary | ICD-10-CM | POA: Diagnosis not present

## 2022-05-26 DIAGNOSIS — R296 Repeated falls: Secondary | ICD-10-CM | POA: Diagnosis not present

## 2022-05-26 DIAGNOSIS — M545 Low back pain, unspecified: Secondary | ICD-10-CM | POA: Diagnosis not present

## 2022-05-28 MED ORDER — LIDOCAINE HCL 1 % IJ SOLN
3.0000 mL | INTRAMUSCULAR | Status: AC | PRN
  Administered 2022-01-11: 3 mL

## 2022-05-28 MED ORDER — METHYLPREDNISOLONE ACETATE 40 MG/ML IJ SUSP
40.0000 mg | INTRAMUSCULAR | Status: AC | PRN
  Administered 2022-01-11: 40 mg via INTRA_ARTICULAR

## 2022-05-29 DIAGNOSIS — M545 Low back pain, unspecified: Secondary | ICD-10-CM | POA: Diagnosis not present

## 2022-05-29 DIAGNOSIS — M810 Age-related osteoporosis without current pathological fracture: Secondary | ICD-10-CM | POA: Diagnosis not present

## 2022-05-29 DIAGNOSIS — I10 Essential (primary) hypertension: Secondary | ICD-10-CM | POA: Diagnosis not present

## 2022-05-29 DIAGNOSIS — R2681 Unsteadiness on feet: Secondary | ICD-10-CM | POA: Diagnosis not present

## 2022-05-29 DIAGNOSIS — R296 Repeated falls: Secondary | ICD-10-CM | POA: Diagnosis not present

## 2022-05-29 DIAGNOSIS — G7000 Myasthenia gravis without (acute) exacerbation: Secondary | ICD-10-CM | POA: Diagnosis not present

## 2022-05-31 DIAGNOSIS — I10 Essential (primary) hypertension: Secondary | ICD-10-CM | POA: Diagnosis not present

## 2022-05-31 DIAGNOSIS — R296 Repeated falls: Secondary | ICD-10-CM | POA: Diagnosis not present

## 2022-05-31 DIAGNOSIS — M810 Age-related osteoporosis without current pathological fracture: Secondary | ICD-10-CM | POA: Diagnosis not present

## 2022-05-31 DIAGNOSIS — M545 Low back pain, unspecified: Secondary | ICD-10-CM | POA: Diagnosis not present

## 2022-05-31 DIAGNOSIS — R2681 Unsteadiness on feet: Secondary | ICD-10-CM | POA: Diagnosis not present

## 2022-05-31 DIAGNOSIS — G7000 Myasthenia gravis without (acute) exacerbation: Secondary | ICD-10-CM | POA: Diagnosis not present

## 2022-06-02 DIAGNOSIS — K430 Incisional hernia with obstruction, without gangrene: Secondary | ICD-10-CM | POA: Diagnosis not present

## 2022-06-07 DIAGNOSIS — G7000 Myasthenia gravis without (acute) exacerbation: Secondary | ICD-10-CM | POA: Diagnosis not present

## 2022-06-07 DIAGNOSIS — I10 Essential (primary) hypertension: Secondary | ICD-10-CM | POA: Diagnosis not present

## 2022-06-07 DIAGNOSIS — R296 Repeated falls: Secondary | ICD-10-CM | POA: Diagnosis not present

## 2022-06-07 DIAGNOSIS — M545 Low back pain, unspecified: Secondary | ICD-10-CM | POA: Diagnosis not present

## 2022-06-07 DIAGNOSIS — M810 Age-related osteoporosis without current pathological fracture: Secondary | ICD-10-CM | POA: Diagnosis not present

## 2022-06-07 DIAGNOSIS — R2681 Unsteadiness on feet: Secondary | ICD-10-CM | POA: Diagnosis not present

## 2022-06-16 DIAGNOSIS — R296 Repeated falls: Secondary | ICD-10-CM | POA: Diagnosis not present

## 2022-06-16 DIAGNOSIS — M810 Age-related osteoporosis without current pathological fracture: Secondary | ICD-10-CM | POA: Diagnosis not present

## 2022-06-16 DIAGNOSIS — M545 Low back pain, unspecified: Secondary | ICD-10-CM | POA: Diagnosis not present

## 2022-06-16 DIAGNOSIS — R2681 Unsteadiness on feet: Secondary | ICD-10-CM | POA: Diagnosis not present

## 2022-06-16 DIAGNOSIS — G7000 Myasthenia gravis without (acute) exacerbation: Secondary | ICD-10-CM | POA: Diagnosis not present

## 2022-06-16 DIAGNOSIS — I10 Essential (primary) hypertension: Secondary | ICD-10-CM | POA: Diagnosis not present

## 2022-06-19 DIAGNOSIS — J309 Allergic rhinitis, unspecified: Secondary | ICD-10-CM | POA: Diagnosis not present

## 2022-06-19 DIAGNOSIS — F325 Major depressive disorder, single episode, in full remission: Secondary | ICD-10-CM | POA: Diagnosis not present

## 2022-06-19 DIAGNOSIS — J45909 Unspecified asthma, uncomplicated: Secondary | ICD-10-CM | POA: Diagnosis not present

## 2022-06-19 DIAGNOSIS — M545 Low back pain, unspecified: Secondary | ICD-10-CM | POA: Diagnosis not present

## 2022-06-19 DIAGNOSIS — Z8673 Personal history of transient ischemic attack (TIA), and cerebral infarction without residual deficits: Secondary | ICD-10-CM | POA: Diagnosis not present

## 2022-06-19 DIAGNOSIS — Z79899 Other long term (current) drug therapy: Secondary | ICD-10-CM | POA: Diagnosis not present

## 2022-06-19 DIAGNOSIS — G7000 Myasthenia gravis without (acute) exacerbation: Secondary | ICD-10-CM | POA: Diagnosis not present

## 2022-06-19 DIAGNOSIS — M810 Age-related osteoporosis without current pathological fracture: Secondary | ICD-10-CM | POA: Diagnosis not present

## 2022-06-19 DIAGNOSIS — Z791 Long term (current) use of non-steroidal anti-inflammatories (NSAID): Secondary | ICD-10-CM | POA: Diagnosis not present

## 2022-06-19 DIAGNOSIS — E039 Hypothyroidism, unspecified: Secondary | ICD-10-CM | POA: Diagnosis not present

## 2022-06-19 DIAGNOSIS — G4700 Insomnia, unspecified: Secondary | ICD-10-CM | POA: Diagnosis not present

## 2022-06-19 DIAGNOSIS — R296 Repeated falls: Secondary | ICD-10-CM | POA: Diagnosis not present

## 2022-06-19 DIAGNOSIS — Z981 Arthrodesis status: Secondary | ICD-10-CM | POA: Diagnosis not present

## 2022-06-19 DIAGNOSIS — Z8781 Personal history of (healed) traumatic fracture: Secondary | ICD-10-CM | POA: Diagnosis not present

## 2022-06-19 DIAGNOSIS — I10 Essential (primary) hypertension: Secondary | ICD-10-CM | POA: Diagnosis not present

## 2022-06-19 DIAGNOSIS — E782 Mixed hyperlipidemia: Secondary | ICD-10-CM | POA: Diagnosis not present

## 2022-06-19 DIAGNOSIS — Z96641 Presence of right artificial hip joint: Secondary | ICD-10-CM | POA: Diagnosis not present

## 2022-06-19 DIAGNOSIS — N3941 Urge incontinence: Secondary | ICD-10-CM | POA: Diagnosis not present

## 2022-06-19 DIAGNOSIS — R2681 Unsteadiness on feet: Secondary | ICD-10-CM | POA: Diagnosis not present

## 2022-06-22 ENCOUNTER — Other Ambulatory Visit (HOSPITAL_BASED_OUTPATIENT_CLINIC_OR_DEPARTMENT_OTHER): Payer: Self-pay | Admitting: Surgery

## 2022-06-22 ENCOUNTER — Other Ambulatory Visit: Payer: Self-pay | Admitting: Surgery

## 2022-06-22 ENCOUNTER — Other Ambulatory Visit (HOSPITAL_COMMUNITY): Payer: Self-pay | Admitting: Surgery

## 2022-06-22 DIAGNOSIS — K430 Incisional hernia with obstruction, without gangrene: Secondary | ICD-10-CM

## 2022-06-22 DIAGNOSIS — R110 Nausea: Secondary | ICD-10-CM

## 2022-06-25 ENCOUNTER — Inpatient Hospital Stay (HOSPITAL_COMMUNITY)
Admission: EM | Admit: 2022-06-25 | Discharge: 2022-06-27 | DRG: 394 | Disposition: A | Discharge status: Home Health | Payer: Medicare Other | Attending: Internal Medicine | Admitting: Internal Medicine

## 2022-06-25 ENCOUNTER — Emergency Department (HOSPITAL_COMMUNITY): Payer: Medicare Other

## 2022-06-25 ENCOUNTER — Encounter (HOSPITAL_COMMUNITY): Payer: Self-pay

## 2022-06-25 DIAGNOSIS — K436 Other and unspecified ventral hernia with obstruction, without gangrene: Secondary | ICD-10-CM | POA: Diagnosis not present

## 2022-06-25 DIAGNOSIS — K439 Ventral hernia without obstruction or gangrene: Principal | ICD-10-CM | POA: Diagnosis present

## 2022-06-25 DIAGNOSIS — R188 Other ascites: Secondary | ICD-10-CM | POA: Diagnosis present

## 2022-06-25 DIAGNOSIS — Z87892 Personal history of anaphylaxis: Secondary | ICD-10-CM | POA: Diagnosis not present

## 2022-06-25 DIAGNOSIS — E781 Pure hyperglyceridemia: Secondary | ICD-10-CM | POA: Diagnosis present

## 2022-06-25 DIAGNOSIS — F419 Anxiety disorder, unspecified: Secondary | ICD-10-CM | POA: Diagnosis present

## 2022-06-25 DIAGNOSIS — I10 Essential (primary) hypertension: Secondary | ICD-10-CM | POA: Diagnosis present

## 2022-06-25 DIAGNOSIS — Z888 Allergy status to other drugs, medicaments and biological substances status: Secondary | ICD-10-CM

## 2022-06-25 DIAGNOSIS — M159 Polyosteoarthritis, unspecified: Secondary | ICD-10-CM | POA: Diagnosis present

## 2022-06-25 DIAGNOSIS — Z8673 Personal history of transient ischemic attack (TIA), and cerebral infarction without residual deficits: Secondary | ICD-10-CM

## 2022-06-25 DIAGNOSIS — R1084 Generalized abdominal pain: Secondary | ICD-10-CM | POA: Diagnosis not present

## 2022-06-25 DIAGNOSIS — Z823 Family history of stroke: Secondary | ICD-10-CM | POA: Diagnosis not present

## 2022-06-25 DIAGNOSIS — R109 Unspecified abdominal pain: Secondary | ICD-10-CM | POA: Diagnosis not present

## 2022-06-25 DIAGNOSIS — C786 Secondary malignant neoplasm of retroperitoneum and peritoneum: Secondary | ICD-10-CM | POA: Diagnosis present

## 2022-06-25 DIAGNOSIS — E039 Hypothyroidism, unspecified: Secondary | ICD-10-CM | POA: Diagnosis not present

## 2022-06-25 DIAGNOSIS — Z8249 Family history of ischemic heart disease and other diseases of the circulatory system: Secondary | ICD-10-CM | POA: Diagnosis not present

## 2022-06-25 DIAGNOSIS — R197 Diarrhea, unspecified: Secondary | ICD-10-CM | POA: Diagnosis not present

## 2022-06-25 DIAGNOSIS — F32A Depression, unspecified: Secondary | ICD-10-CM | POA: Diagnosis present

## 2022-06-25 DIAGNOSIS — Z96612 Presence of left artificial shoulder joint: Secondary | ICD-10-CM | POA: Diagnosis present

## 2022-06-25 DIAGNOSIS — Z79890 Hormone replacement therapy (postmenopausal): Secondary | ICD-10-CM

## 2022-06-25 DIAGNOSIS — Z85828 Personal history of other malignant neoplasm of skin: Secondary | ICD-10-CM

## 2022-06-25 DIAGNOSIS — E876 Hypokalemia: Secondary | ICD-10-CM | POA: Diagnosis present

## 2022-06-25 DIAGNOSIS — I5032 Chronic diastolic (congestive) heart failure: Secondary | ICD-10-CM | POA: Diagnosis present

## 2022-06-25 DIAGNOSIS — Z882 Allergy status to sulfonamides status: Secondary | ICD-10-CM

## 2022-06-25 DIAGNOSIS — K432 Incisional hernia without obstruction or gangrene: Secondary | ICD-10-CM | POA: Diagnosis not present

## 2022-06-25 DIAGNOSIS — M793 Panniculitis, unspecified: Secondary | ICD-10-CM | POA: Diagnosis present

## 2022-06-25 DIAGNOSIS — Z79899 Other long term (current) drug therapy: Secondary | ICD-10-CM

## 2022-06-25 DIAGNOSIS — M810 Age-related osteoporosis without current pathological fracture: Secondary | ICD-10-CM | POA: Diagnosis present

## 2022-06-25 DIAGNOSIS — K5900 Constipation, unspecified: Secondary | ICD-10-CM | POA: Diagnosis present

## 2022-06-25 DIAGNOSIS — Z91041 Radiographic dye allergy status: Secondary | ICD-10-CM

## 2022-06-25 DIAGNOSIS — N2889 Other specified disorders of kidney and ureter: Secondary | ICD-10-CM | POA: Diagnosis not present

## 2022-06-25 DIAGNOSIS — Z7951 Long term (current) use of inhaled steroids: Secondary | ICD-10-CM

## 2022-06-25 DIAGNOSIS — I110 Hypertensive heart disease with heart failure: Secondary | ICD-10-CM | POA: Diagnosis present

## 2022-06-25 DIAGNOSIS — Z96641 Presence of right artificial hip joint: Secondary | ICD-10-CM | POA: Diagnosis present

## 2022-06-25 DIAGNOSIS — Z1152 Encounter for screening for COVID-19: Secondary | ICD-10-CM | POA: Diagnosis not present

## 2022-06-25 DIAGNOSIS — Z881 Allergy status to other antibiotic agents status: Secondary | ICD-10-CM

## 2022-06-25 DIAGNOSIS — G7000 Myasthenia gravis without (acute) exacerbation: Secondary | ICD-10-CM | POA: Diagnosis not present

## 2022-06-25 DIAGNOSIS — J4489 Other specified chronic obstructive pulmonary disease: Secondary | ICD-10-CM | POA: Diagnosis present

## 2022-06-25 DIAGNOSIS — R112 Nausea with vomiting, unspecified: Secondary | ICD-10-CM | POA: Diagnosis not present

## 2022-06-25 DIAGNOSIS — Z7982 Long term (current) use of aspirin: Secondary | ICD-10-CM

## 2022-06-25 DIAGNOSIS — I5020 Unspecified systolic (congestive) heart failure: Secondary | ICD-10-CM | POA: Diagnosis present

## 2022-06-25 LAB — CBC WITH DIFFERENTIAL/PLATELET
Abs Immature Granulocytes: 0.03 10*3/uL (ref 0.00–0.07)
Basophils Absolute: 0 10*3/uL (ref 0.0–0.1)
Basophils Relative: 0 %
Eosinophils Absolute: 0.1 10*3/uL (ref 0.0–0.5)
Eosinophils Relative: 1 %
HCT: 40.2 % (ref 36.0–46.0)
Hemoglobin: 13 g/dL (ref 12.0–15.0)
Immature Granulocytes: 0 %
Lymphocytes Relative: 12 %
Lymphs Abs: 0.8 10*3/uL (ref 0.7–4.0)
MCH: 28.2 pg (ref 26.0–34.0)
MCHC: 32.3 g/dL (ref 30.0–36.0)
MCV: 87.2 fL (ref 80.0–100.0)
Monocytes Absolute: 0.6 10*3/uL (ref 0.1–1.0)
Monocytes Relative: 8 %
Neutro Abs: 5.6 10*3/uL (ref 1.7–7.7)
Neutrophils Relative %: 79 %
Platelets: 289 10*3/uL (ref 150–400)
RBC: 4.61 MIL/uL (ref 3.87–5.11)
RDW: 13.1 % (ref 11.5–15.5)
WBC: 7.2 10*3/uL (ref 4.0–10.5)
nRBC: 0 % (ref 0.0–0.2)

## 2022-06-25 LAB — COMPREHENSIVE METABOLIC PANEL
ALT: 16 U/L (ref 0–44)
AST: 24 U/L (ref 15–41)
Albumin: 3.3 g/dL — ABNORMAL LOW (ref 3.5–5.0)
Alkaline Phosphatase: 89 U/L (ref 38–126)
Anion gap: 7 (ref 5–15)
BUN: 6 mg/dL — ABNORMAL LOW (ref 8–23)
CO2: 27 mmol/L (ref 22–32)
Calcium: 9 mg/dL (ref 8.9–10.3)
Chloride: 103 mmol/L (ref 98–111)
Creatinine, Ser: 0.57 mg/dL (ref 0.44–1.00)
GFR, Estimated: >60 mL/min (ref >60)
Glucose, Bld: 122 mg/dL — ABNORMAL HIGH (ref 70–99)
Potassium: 2.8 mmol/L — ABNORMAL LOW (ref 3.5–5.1)
Sodium: 137 mmol/L (ref 135–145)
Total Bilirubin: 1.1 mg/dL (ref 0.3–1.2)
Total Protein: 5.9 g/dL — ABNORMAL LOW (ref 6.5–8.1)

## 2022-06-25 LAB — LIPASE, BLOOD: Lipase: 22 U/L (ref 11–51)

## 2022-06-25 LAB — LACTIC ACID, PLASMA: Lactic Acid, Venous: 1.6 mmol/L (ref 0.5–1.9)

## 2022-06-25 MED ORDER — ASPIRIN 81 MG PO TBEC
81.0000 mg | DELAYED_RELEASE_TABLET | Freq: Every day | ORAL | Status: DC
  Administered 2022-06-26 – 2022-06-27 (×2): 81 mg via ORAL
  Filled 2022-06-25 (×2): qty 1

## 2022-06-25 MED ORDER — MYCOPHENOLATE MOFETIL 250 MG PO CAPS
1000.0000 mg | ORAL_CAPSULE | Freq: Two times a day (BID) | ORAL | Status: DC
  Administered 2022-06-26 – 2022-06-27 (×4): 1000 mg via ORAL
  Filled 2022-06-25 (×5): qty 4

## 2022-06-25 MED ORDER — ENOXAPARIN SODIUM 40 MG/0.4ML IJ SOSY
40.0000 mg | PREFILLED_SYRINGE | INTRAMUSCULAR | Status: DC
  Administered 2022-06-26 – 2022-06-27 (×2): 40 mg via SUBCUTANEOUS
  Filled 2022-06-25 (×2): qty 0.4

## 2022-06-25 MED ORDER — ADULT MULTIVITAMIN W/MINERALS CH
1.0000 | ORAL_TABLET | Freq: Every day | ORAL | Status: DC
  Administered 2022-06-26 – 2022-06-27 (×2): 1 via ORAL
  Filled 2022-06-25 (×2): qty 1

## 2022-06-25 MED ORDER — FLUTICASONE FUROATE-VILANTEROL 200-25 MCG/ACT IN AEPB
1.0000 | INHALATION_SPRAY | Freq: Every day | RESPIRATORY_TRACT | Status: DC
  Administered 2022-06-27: 1 via RESPIRATORY_TRACT
  Filled 2022-06-25: qty 28

## 2022-06-25 MED ORDER — LEVOTHYROXINE SODIUM 50 MCG PO TABS
75.0000 ug | ORAL_TABLET | Freq: Every day | ORAL | Status: DC
  Administered 2022-06-26 – 2022-06-27 (×2): 75 ug via ORAL
  Filled 2022-06-25 (×2): qty 1

## 2022-06-25 MED ORDER — ALPRAZOLAM 0.25 MG PO TABS
0.2500 mg | ORAL_TABLET | Freq: Four times a day (QID) | ORAL | Status: DC | PRN
  Administered 2022-06-26: 0.25 mg via ORAL
  Filled 2022-06-25: qty 1

## 2022-06-25 MED ORDER — OMEGA-3-ACID ETHYL ESTERS 1 G PO CAPS
1000.0000 mg | ORAL_CAPSULE | Freq: Every day | ORAL | Status: DC
  Administered 2022-06-26 – 2022-06-27 (×2): 1000 mg via ORAL
  Filled 2022-06-25 (×2): qty 1

## 2022-06-25 MED ORDER — POTASSIUM CHLORIDE 10 MEQ/100ML IV SOLN
10.0000 meq | INTRAVENOUS | Status: AC
  Administered 2022-06-26 (×2): 10 meq via INTRAVENOUS
  Filled 2022-06-25 (×2): qty 100

## 2022-06-25 MED ORDER — UMECLIDINIUM BROMIDE 62.5 MCG/ACT IN AEPB
1.0000 | INHALATION_SPRAY | Freq: Every day | RESPIRATORY_TRACT | Status: DC
  Filled 2022-06-25: qty 7

## 2022-06-25 MED ORDER — FLUOXETINE HCL 20 MG PO CAPS
20.0000 mg | ORAL_CAPSULE | Freq: Every day | ORAL | Status: DC
  Administered 2022-06-26 – 2022-06-27 (×2): 20 mg via ORAL
  Filled 2022-06-25 (×2): qty 1

## 2022-06-25 MED ORDER — MONTELUKAST SODIUM 10 MG PO TABS
10.0000 mg | ORAL_TABLET | Freq: Every day | ORAL | Status: DC
  Administered 2022-06-26: 10 mg via ORAL

## 2022-06-25 MED ORDER — POTASSIUM CHLORIDE 2 MEQ/ML IV SOLN
INTRAVENOUS | Status: AC
  Filled 2022-06-25 (×2): qty 1000

## 2022-06-25 NOTE — ED Provider Notes (Signed)
[facility] DEPT Provider Note   CSN: [id number] Arrival date & time: 06/25/22  1617     History {Add pertinent medical, surgical, social history, OB history to HPI:1} Chief Complaint  Patient presents with   Abdominal Pain    GLORIE DOWLEN is a 81 y.o. female.   Abdominal Pain Associated symptoms: nausea and vomiting    81 y.o. F with hx of HTN, myasthenia gravis, hx CVA, chronic incisional hernia, TIA, presenting to the ED for abdominal pain.  Patient reports incisional hernia present for the past 35 years after open cholecystectomy.  Recently has been having increased issues and was seen by general surgery and scheduled for OP CT scan.  States she started to have increased pain, abdominal bloating, nausea, and vomiting.  Patient reports she tried stool softener which then gave her diarrhea as well.  Denies fever/chills/sweats.  Pain intensified today so came to the ED for evaluation.    Home Medications Prior to Admission medications   Medication Sig Start Date End Date Taking? Authorizing Provider  ALPRAZolam Duanne Moron) 0.5 MG tablet Take 0.5 mg by mouth 4 (four) times daily as needed. 05/31/22  Yes [provider]  Ascorbic Acid (VITAMIN C) 1000 MG tablet Take 1,000 mg by mouth every morning.    Yes [provider]  aspirin EC 81 MG EC tablet Take 1 tablet (81 mg total) by mouth daily. Swallow whole. 04/13/21  Yes Einar Pheasant, NP  atorvastatin (LIPITOR) 20 MG tablet Take 1 tablet (20 mg total) by mouth daily. 04/13/21  Yes Einar Pheasant, NP  Cholecalciferol (VITAMIN D-3 PO) Take 1,000 mg by mouth every morning.    Yes [provider]  diclofenac Sodium (VOLTAREN) 1 % GEL Apply 2 g topically every 6 (six) hours as needed (pain).   Yes [provider]  FLUoxetine (PROZAC) 20 MG capsule Take 20 mg by mouth daily. 04/08/22  Yes [provider]  fluticasone (FLONASE) 50 MCG/ACT nasal spray Place 2 sprays  into both nostrils daily. 07/27/17  Yes [provider]  levothyroxine (SYNTHROID, LEVOTHROID) 75 MCG tablet Take 75 mcg by mouth every morning. MUST TAKE BRAND NAME SYNTHROID   Yes [provider]  montelukast (SINGULAIR) 10 MG tablet Take 10 mg by mouth at bedtime. MUST BE BRAND NAME ONLY-SINGULAIR   Yes [provider]  Multiple Vitamins-Minerals (CENTRUM SILVER) tablet Take 1 tablet by mouth daily. 02/16/11  Yes [provider]  mycophenolate (CELLCEPT) 500 MG tablet Take 1,000 mg by mouth 2 (two) times daily.    Yes [provider]  naproxen (NAPROSYN) 500 MG tablet Take 500 mg by mouth 2 (two) times daily. 06/07/22  Yes [provider]  Omega 3 1200 MG CAPS Take 1,200 mg by mouth daily.   Yes [provider]  traZODone (DESYREL) 150 MG tablet Take 75 mg by mouth at bedtime.    Yes [provider]  TRELEGY ELLIPTA 200-62.5-25 MCG/INH AEPB Inhale 1 puff into the lungs daily. 02/28/21  Yes [provider]  valsartan (DIOVAN) 160 MG tablet Take 1 tablet (160 mg total) by mouth daily. 03/25/21 06/25/22 Yes Eugenie Filler, MD  enoxaparin (LOVENOX) 40 MG/0.4ML injection Inject 0.4 mLs (40 mg total) into the skin daily. Patient not taking: Reported on 06/25/2022 03/25/21   Eugenie Filler, MD  oxyCODONE-acetaminophen (PERCOCET/ROXICET) 5-325 MG tablet Take 1 tablet by mouth every 8 (eight) hours as needed for severe pain. Patient not taking: Reported on 06/25/2022  01/11/22   Mcarthur Rossetti, MD  simethicone (MYLICON) 80 MG chewable tablet Chew 2 tablets (160 mg total) by mouth 4 (four) times daily as needed for flatulence. Patient not taking: Reported on 06/25/2022 03/25/21   Eugenie Filler, MD      Allergies    Azathioprine, Other, Aminoglycosides, Avelox [moxifloxacin hcl in nacl], Beta adrenergic blockers, Calcium channel blockers, Cephalosporins, Ciprofloxacin, Clindamycin/lincomycin, Colistin,  Fenofibrate, Fosamax [alendronate sodium], Imuran [azathioprine sodium], Iodinated contrast media, Iodine, Macrolides and ketolides, Magnesium-containing compounds, Moxifloxacin, Niacin and related, Norfloxacin, Ofloxacin, Pefloxacin, Penicillamine, Prednisolone, Prednisone, Procainamide, Quinidine, Quinine derivatives, Succinylcholine, Tubocurarine, Vecuronium, and Vicodin [hydrocodone-acetaminophen]    Review of Systems   Review of Systems  Gastrointestinal:  Positive for abdominal pain, nausea and vomiting.  All other systems reviewed and are negative.   Physical Exam Updated Vital Signs BP (!) 174/66   Pulse 62   Temp 98.4 F (36.9 C)   Resp 18   SpO2 96%   Physical Exam Vitals and nursing note reviewed.  Constitutional:      Appearance: She is well-developed.  HENT:     Head: Normocephalic and atraumatic.  Eyes:     Conjunctiva/sclera: Conjunctivae normal.     Pupils: Pupils are equal, round, and reactive to light.  Cardiovascular:     Rate and Rhythm: Normal rate and regular rhythm.     Heart sounds: Normal heart sounds.  Pulmonary:     Effort: Pulmonary effort is normal.     Breath sounds: Normal breath sounds.  Abdominal:     General: Bowel sounds are normal.     Palpations: Abdomen is soft.     Comments: Incisional hernia noted right abdomen, firm to touch, no overlying skin changes/warmth to touch  Musculoskeletal:        General: Normal range of motion.     Cervical back: Normal range of motion.  Skin:    General: Skin is warm and dry.  Neurological:     Mental Status: She is alert and oriented to person, place, and time.     ED Results / Procedures / Treatments   Labs (all labs ordered are listed, but only abnormal results are displayed) Labs Reviewed  COMPREHENSIVE METABOLIC PANEL - Abnormal; Notable for the following components:      Result Value   Potassium 2.8 (*)    Glucose, Bld 122 (*)    BUN 6 (*)    Total Protein 5.9 (*)    Albumin 3.3 (*)     All other components within normal limits  CBC WITH DIFFERENTIAL/PLATELET  LACTIC ACID, PLASMA  LIPASE, BLOOD  URINALYSIS, ROUTINE W REFLEX MICROSCOPIC    EKG None  Radiology CT ABDOMEN PELVIS WO CONTRAST  Result Date: 06/25/2022 CLINICAL DATA:  LLQ abdominal pain abdominal pain, hernias EXAM: CT ABDOMEN AND PELVIS WITHOUT CONTRAST TECHNIQUE: Multidetector CT imaging of the abdomen and pelvis was performed following the standard protocol without IV contrast. RADIATION DOSE REDUCTION: This exam was performed according to the departmental dose-optimization program which includes automated exposure control, adjustment of the mA and/or kV according to patient size and/or use of iterative reconstruction technique. COMPARISON:  CT abdomen pelvis 04/21/2022 FINDINGS: Lower chest: Bilateral lower lobe mild bronchiectasis and architectural distortion likely due to prior prior infection. Hepatobiliary: No focal liver abnormality. Status post cholecystectomy. No biliary dilatation. Pancreas: Diffusely atrophic. No focal lesion. Otherwise normal pancreatic contour. No surrounding inflammatory changes. No main pancreatic ductal dilatation. Spleen: Normal in size without focal abnormality. Adrenals/Urinary Tract: No  adrenal nodule bilaterally. Bilateral renal cortical scarring. No nephrolithiasis and no hydronephrosis. No definite contour-deforming renal mass. No ureterolithiasis or hydroureter. The urinary bladder is unremarkable. Stomach/Bowel: Stomach is within normal limits. No evidence of bowel wall thickening or dilatation. The appendix is not definitely identified with no inflammatory changes in the right lower quadrant to suggest acute appendicitis. Vascular/Lymphatic: No abdominal aorta or iliac aneurysm. Mild atherosclerotic plaque of the aorta and its branches. No abdominal, pelvic, or inguinal lymphadenopathy. Reproductive: Status post hysterectomy. No adnexal masses. Other: Redemonstration of  superior anterior abdominal omental/peritoneal fat stranding and nodularity which extends into a moderate volume right spigelian hernia as well as a small supraumbilical Richter ventral wall hernia. The spigelian hernia as an abdominal defect of approximately 3.3 cm (2:38). The Richter ventral hernia demonstrates an abdominal defect of approximately 3.8 cm in contains a short segment of anti mesenteric transverse colon wall (2:23). Small volume simple free fluid ascites. No free intraperitoneal gas. No abscess formation. Musculoskeletal: No abdominal wall hernia or abnormality. No suspicious lytic or blastic osseous lesions. No acute displaced fracture. Grade 1 anterolisthesis of L4 on L5 status post bilateral posterolateral and interbody fusion surgical hardware. Multilevel degenerative changes of the spine. Partially visualized total right hip arthroplasty. Severe degenerative changes of the left hip. IMPRESSION: 1. Redemonstration of superior anterior abdominal peritoneal/omental fat stranding and nodularity which extends into a moderate volume right spigelian hernia as well as a small supraumbilical Richter ventral wall hernia. Associated small volume free fluid within both hernias. Similar chronic appearance of the associated fat and intraperitoneal fat which may be due to fat necrosis/ischemia; however, underlying peritoneal carcinomatosis or infection cannot be fully excluded. Limited evaluation on this noncontrast study. 2. No associated bowel obstruction with the Richter hernia. 3. Small volume simple free fluid ascites. 4.  Aortic Atherosclerosis (ICD10-I70.0). Electronically Signed   By: Iven Finn M.D.   On: 06/25/2022 18:05    Procedures Procedures  {Document cardiac monitor, telemetry assessment procedure when appropriate:1}  Medications Ordered in ED Medications - No data to display  ED Course/ Medical Decision Making/ A&P                           Medical Decision  Making  ***  11:06 PM Spoke with on call general surgery, Dr. Marlou Starks-- recommends medical admission, may need further work-up but no acute surgical intervention tonight.  Team will see in the morning for full consultation. Final Clinical Impression(s) / ED Diagnoses Final diagnoses:  None    Rx / DC Orders ED Discharge Orders     None

## 2022-06-25 NOTE — ED Provider Triage Note (Signed)
Emergency Medicine Provider Triage Evaluation Note  Sabrina [name] , a 81 y.o. female  was evaluated in triage.  Pt complains of worsening abdominal pain over the past 1 week.  She has a history of 2 ventral hernias, the larger one on the right side containing strangulated omentum as of August 2023 CT and upper abdominal midline hernia containing a small amount of large bowel.  Symptoms became more severe during the course of the week prompting emergency department evaluation today.  Patient has had associated vomiting.  She was constipated but then took some stool softeners and was able to pass stool.  No fevers.  Review of Systems  Positive: Abdominal pain Negative: Fever  Physical Exam  BP (!) 158/62 (BP Location: Right Arm)   Pulse 67   Temp 98.4 F (36.9 C) (Oral)   Resp 18   SpO2 97%  Gen:   Awake, no distress   Resp:  Normal effort  MSK:   Moves extremities without difficulty  Other:  Patient with palpable hernias, associated tenderness  Medical Decision Making  Medically screening exam initiated at 4:50 PM.  Appropriate orders placed.  Lorri Frederick Thul was informed that the remainder of the evaluation will be completed by another provider, this initial triage assessment does not replace that evaluation, and the importance of remaining in the ED until their evaluation is complete.     Carlisle Cater, PA-C 06/25/22 1652

## 2022-06-25 NOTE — H&P (Addendum)
History and Physical  Sabrina [name] NAT:[id number] DOB: 1941-06-27 DOA: 06/25/2022  Referring physician:  Baird Cancer, PA-EDP PCP: Ginger Organ., MD  Outpatient Specialists: General surgery Patient coming from: Home  Chief Complaint: Abdominal pain   HPI: Sabrina [name] is a 81 y.o. 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 Her abdominal distention acutely worsened in the past week.  She had watery stools which ended yesterday and was feeling generally unwell.  In the ED, work-up revealed CT scan redemonstration of superior anterior abdominal peritoneal/omental fat stranding and nodularity which extends into moderate volume right spigelian hernia as well as a small supraumbilical Richter ventral wall hernia.  No associated bowel obstruction with the Richter hernia, small volume simple free fluid ascites.  EDP discussed the case with general surgery Dr. Marlou Starks, who will see the patient in consultation.  Requested admission as observation.  The patient was admitted by [facility], hospitalist service.  ED Course: Tmax 98.4.  BP 165/68, pulse 59, respiratory 18, O2 saturation 98% on room air.  Lab studies remarkable for serum potassium 2.8, glucose 122.  Review of Systems: Review of systems as noted in the HPI. All other systems reviewed and are negative.   Past Medical History:  Diagnosis Date   Anxiety    Arthritis    Asthma    Avascular necrosis (HCC)    Bradycardia    symptomatic   Cancer (HCC)    COUPLE OF SKIN CANCERS   Carpal tunnel syndrome    LEFT --NUMBNESS   Cataracts, bilateral    Cerebral aneurysm    Complication of anesthesia    BP  FALLS/"ANAPHYLACTIC SHOCK"/ EXTREME TREMORS;  DID  FINE WITH SURGERIES APRIL AND OCT 2013 AT [facility] SURGERIES WERE SHORT PROCEDURES--PROBLEMS WITH ANESTHESIA SEEM TO OCCUR WITH THE LONGER PROCEDURES.THE TREMORS WERE AFTER HIP REPLACEMNT 22011-ARMS WERE FLAILING--PT STATES IT TOOK 4 DOSES OF DEMEROL BEFORE THE TREMORS STOPPED.   Depression    History of skin cancer    HSV (herpes simplex virus) infection    CHEST   Hyperlipidemia    Hypertension    PT HAS LOST WEIGHT--B/P NOW WITHIN NORMALS--IS ON B/P MEDICINE   Hypertriglyceridemia    Hypothyroidism    Myasthenia gravis    DOING WELL ON CELLCEPT-FOLLOWED BY DR. LISA HOBSON - [facility] NEUROLOGIST   OA (osteoarthritis) of hip    Osteopenia    Osteoporosis    Pain    LEFT SHOULDER PAIN-TORN ROTATOR CUFF-VERY LIMITED ROM   Pain    TOP OF RIGHT SHOULDER--ROM OK AT PRESENT   Pneumonia    Rash, skin    LOWER ABDOMEN-FUNGAL INFECTION-PT HAS TOPICAL OINTMENT TO USE AS NEEDED.   Seasonal allergies    Thyroid disease    Past Surgical History:  Procedure Laterality Date   BELPHAROPTOSIS REPAIR  01/2006   PTOSIS REPAIR BILATERAL   CARPAL TUNNEL RELEASE  05/31/2012   Procedure: CARPAL TUNNEL RELEASE;  Surgeon: Mcarthur Rossetti, MD;  Location: [facility];  Service: Orthopedics;  Laterality: Left;  Left Open Carpal Tunnel Release   CARPAL TUNNEL RELEASE  10/10/2012   Procedure: CARPAL TUNNEL RELEASE;  Surgeon: Nita Sells, MD;  Location: [facility];  Service: Orthopedics;  Laterality: Left;  CEREBRAL ANEURYSM REPAIR  10/2005   GUGLIEMI COILS TO REPAIR ANEURYSM; NO RESIDUAL PROBLEMS--HAS FOLLOW UP MRI EVERY 1 OR 2 YRS   CHOLECYSTECTOMY     EXTREME DROP IN BLOOD PRESSURE WITH THIS SURGERY- YRS AGO- PT WAS 81 YRS OLD   JOINT REPLACEMENT  10/19/09   RT HIP--SEVERE TREMORS, FLAILING OF ARMS AFTER THIS SURGERY.   left shoulder arthroscopy      REVERSE SHOULDER ARTHROPLASTY  10/10/2012   Procedure: REVERSE SHOULDER ARTHROPLASTY;  Surgeon: Nita Sells, MD;  Location: [facility];  Service: Orthopedics;  Laterality: Left;   TONSILLECTOMY AND ADENOIDECTOMY     TOTAL HIP ARTHROPLASTY     right   TUBAL LIGATION     ANAPHYLACTIC SHOCK AFTER THIS SURGERY-CAUSE THOUGHT TO  BE RELATED TO SODIUM PENTOTHAL    Social History:  reports that she has never smoked. She has never used smokeless tobacco. She reports current alcohol use. She reports that she does not use drugs.   Allergies  Allergen Reactions   Azathioprine Anaphylaxis and Other (See Comments)    Kidneys shut down   Other Anaphylaxis    Sodium pentathol Uncoded Allergy. Allergen: Sodium pentathol   Aminoglycosides     Tobramycin, gentamycin, kanamycin, neomycin, streptomycin Can't take because of myasthenia gravis   Avelox [Moxifloxacin Hcl In Nacl] Other (See Comments)    Can't take because of myasthenia gravis   Beta Adrenergic Blockers     Proponolol, timolol maleate eyedrops Can't take because of myasthenia gravis   Calcium Channel Blockers     Blood pressure medications Can't take because of myasthenia gravis   Cephalosporins     Can't take because of myasthenia gravis   Ciprofloxacin Other (See Comments)    Can't take because of myasthenia gravis Can't take because of myasthenia gravis   Clindamycin/Lincomycin     May exascerbate myasthenia gravis   Colistin     Can't take because of myasthenia gravis   Fenofibrate     Other reaction(s): GI upset, myalgia   Fosamax [Alendronate Sodium]     LEG WEAKNESS   Imuran [Azathioprine Sodium] Other (See Comments)    Kidneys shut down   Iodinated Contrast Media Other (See Comments)    Contraindicated to Myasthenia Gravis   Iodine     Iodine contrast Can't take because of myasthenia gravis   Macrolides And Ketolides     Erythromocin, azithromycin, telithromycin Can't take because of myasthenia gravis   Magnesium-Containing Compounds     Including milk of magnesia, antacids containing magnesium hydroxide  (maalox, mylanta) and epsom salts Can't take because of myasthenia gravis   Moxifloxacin Other (See Comments)    Can't take because of myasthenia gravis   Niacin And Related     Other reaction(s): leg aches   Norfloxacin Other (See Comments)    Can't take because of myasthenia gravis Can't take because of myasthenia gravis   Ofloxacin Other (See Comments)    Can't take because of myasthenia gravis Can't take because of myasthenia gravis   Pefloxacin Other (See Comments)    Can't take because of myasthenia gravis Can't take because of myasthenia gravis   Penicillamine     do not use due to Myasthenia Gravis     Prednisolone Nausea And Vomiting   Prednisone Nausea And Vomiting   Procainamide     Can't take because of myasthenia gravis   Quinidine     Can't take because of myasthenia gravis   Quinine Derivatives  Can't take because of myasthenia gravis   Succinylcholine     Can't take because of myasthenia gravis No paralytics   Tubocurarine     Can't take because of myasthenia gravis   Vecuronium     Can't take because of myasthenia gravis No paralytics   Vicodin [Hydrocodone-Acetaminophen] Other (See Comments)    Pt suffers from bad headaches    Family History  Problem Relation Age of Onset   Hypertension Mother    Heart disease Mother        heart attack   Stroke Father    Heart disease Father        heart attack   Breast cancer Neg Hx       Prior to Admission medications   Medication Sig Start Date End Date Taking? Authorizing Provider  ALPRAZolam Duanne Moron) 0.5 MG tablet Take 0.5 mg by mouth 4 (four) times daily as needed. 05/31/22  Yes [provider]  Ascorbic Acid (VITAMIN C) 1000 MG tablet Take 1,000 mg by mouth every morning.    Yes [provider]  aspirin EC 81 MG EC tablet Take 1 tablet (81 mg total) by mouth daily. Swallow whole. 04/13/21  Yes Einar Pheasant, NP  atorvastatin (LIPITOR) 20 MG tablet Take 1 tablet (20 mg total) by  mouth daily. 04/13/21  Yes Einar Pheasant, NP  Cholecalciferol (VITAMIN D-3 PO) Take 1,000 mg by mouth every morning.    Yes [provider]  diclofenac Sodium (VOLTAREN) 1 % GEL Apply 2 g topically every 6 (six) hours as needed (pain).   Yes [provider]  FLUoxetine (PROZAC) 20 MG capsule Take 20 mg by mouth daily. 04/08/22  Yes [provider]  fluticasone (FLONASE) 50 MCG/ACT nasal spray Place 2 sprays into both nostrils daily. 07/27/17  Yes [provider]  levothyroxine (SYNTHROID, LEVOTHROID) 75 MCG tablet Take 75 mcg by mouth every morning. MUST TAKE BRAND NAME SYNTHROID   Yes [provider]  montelukast (SINGULAIR) 10 MG tablet Take 10 mg by mouth at bedtime. MUST BE BRAND NAME ONLY-SINGULAIR   Yes [provider]  Multiple Vitamins-Minerals (CENTRUM SILVER) tablet Take 1 tablet by mouth daily. 02/16/11  Yes [provider]  mycophenolate (CELLCEPT) 500 MG tablet Take 1,000 mg by mouth 2 (two) times daily.    Yes [provider]  naproxen (NAPROSYN) 500 MG tablet Take 500 mg by mouth 2 (two) times daily. 06/07/22  Yes [provider]  Omega 3 1200 MG CAPS Take 1,200 mg by mouth daily.   Yes [provider]  traZODone (DESYREL) 150 MG tablet Take 75 mg by mouth at bedtime.    Yes [provider]  TRELEGY ELLIPTA 200-62.5-25 MCG/INH AEPB Inhale 1 puff into the lungs daily. 02/28/21  Yes [provider]  valsartan (DIOVAN) 160 MG tablet Take 1 tablet (160 mg total) by mouth daily. 03/25/21 06/25/22 Yes Eugenie Filler, MD  enoxaparin (LOVENOX) 40 MG/0.4ML injection Inject 0.4 mLs (40 mg total) into the skin daily. Patient not taking: Reported on 06/25/2022 03/25/21   Eugenie Filler, MD  oxyCODONE-acetaminophen (PERCOCET/ROXICET) 5-325 MG tablet Take 1 tablet by mouth every 8 (eight) hours as needed for severe pain. Patient not taking: Reported on 06/25/2022 01/11/22   Mcarthur Rossetti, MD  simethicone [facility] [address]) 80 MG chewable tablet Chew 2 tablets (160 mg total) by mouth 4 (four) times daily as needed for flatulence. Patient not taking: Reported on 06/25/2022 03/25/21   Irine Seal  V, MD    Physical Exam: BP (!) 165/68   Pulse (!) 59   Temp 98.4 F (36.9 C)   Resp 18   SpO2 98%   General: 81 y.o. year-old female well developed well nourished in no acute distress.  Alert and oriented x3. Cardiovascular: Regular rate and rhythm with no rubs or gallops.  No thyromegaly or JVD noted.  No lower extremity edema. 2/4 pulses in all 4 extremities. Respiratory: Clear to auscultation with no wheezes or rales. Good inspiratory effort. Abdomen: Distended with normal bowel sounds x4 quadrants. Muskuloskeletal: No cyanosis, clubbing or edema noted bilaterally Neuro: CN II-XII intact, strength, sensation, reflexes Skin: No ulcerative lesions noted or rashes Psychiatry: Judgement and insight appear normal. Mood is appropriate for condition and setting          Labs on Admission:  Basic Metabolic Panel: Recent Labs  Lab 06/25/22 1646  NA 137  K 2.8*  CL 103  CO2 27  GLUCOSE 122*  BUN 6*  CREATININE 0.57  CALCIUM 9.0   Liver Function Tests: Recent Labs  Lab 06/25/22 1646  AST 24  ALT 16  ALKPHOS 89  BILITOT 1.1  PROT 5.9*  ALBUMIN 3.3*   Recent Labs  Lab 06/25/22 1646  LIPASE 22   No results for input(s): "AMMONIA" in the last 168 hours. CBC: Recent Labs  Lab 06/25/22 1646  WBC 7.2  NEUTROABS 5.6  HGB 13.0  HCT 40.2  MCV 87.2  PLT 289   Cardiac Enzymes: No results for input(s): "CKTOTAL", "CKMB", "CKMBINDEX", "TROPONINI" in the last 168 hours.  BNP (last 3 results) No results for input(s): "BNP" in the last 8760 hours.  ProBNP (last 3 results) No results for input(s): "PROBNP" in the last 8760 hours.  CBG: No results for input(s): "GLUCAP" in the last 168 hours.  Radiological Exams on Admission: CT ABDOMEN PELVIS WO  CONTRAST  Result Date: 06/25/2022 CLINICAL DATA:  LLQ abdominal pain abdominal pain, hernias EXAM: CT ABDOMEN AND PELVIS WITHOUT CONTRAST TECHNIQUE: Multidetector CT imaging of the abdomen and pelvis was performed following the standard protocol without IV contrast. RADIATION DOSE REDUCTION: This exam was performed according to the departmental dose-optimization program which includes automated exposure control, adjustment of the mA and/or kV according to patient size and/or use of iterative reconstruction technique. COMPARISON:  CT abdomen pelvis 04/21/2022 FINDINGS: Lower chest: Bilateral lower lobe mild bronchiectasis and architectural distortion likely due to prior prior infection. Hepatobiliary: No focal liver abnormality. Status post cholecystectomy. No biliary dilatation. Pancreas: Diffusely atrophic. No focal lesion. Otherwise normal pancreatic contour. No surrounding inflammatory changes. No main pancreatic ductal dilatation. Spleen: Normal in size without focal abnormality. Adrenals/Urinary Tract: No adrenal nodule bilaterally. Bilateral renal cortical scarring. No nephrolithiasis and no hydronephrosis. No definite contour-deforming renal mass. No ureterolithiasis or hydroureter. The urinary bladder is unremarkable. Stomach/Bowel: Stomach is within normal limits. No evidence of bowel wall thickening or dilatation. The appendix is not definitely identified with no inflammatory changes in the right lower quadrant to suggest acute appendicitis. Vascular/Lymphatic: No abdominal aorta or iliac aneurysm. Mild atherosclerotic plaque of the aorta and its branches. No abdominal, pelvic, or inguinal lymphadenopathy. Reproductive: Status post hysterectomy. No adnexal masses. Other: Redemonstration of superior anterior abdominal omental/peritoneal fat stranding and nodularity which extends into a moderate volume right spigelian hernia as well as a small supraumbilical Richter ventral wall hernia. The spigelian  hernia as an abdominal defect of approximately 3.3 cm (2:38). The Richter ventral hernia demonstrates an abdominal defect of  approximately 3.8 cm in contains a short segment of anti mesenteric transverse colon wall (2:23). Small volume simple free fluid ascites. No free intraperitoneal gas. No abscess formation. Musculoskeletal: No abdominal wall hernia or abnormality. No suspicious lytic or blastic osseous lesions. No acute displaced fracture. Grade 1 anterolisthesis of L4 on L5 status post bilateral posterolateral and interbody fusion surgical hardware. Multilevel degenerative changes of the spine. Partially visualized total right hip arthroplasty. Severe degenerative changes of the left hip. IMPRESSION: 1. Redemonstration of superior anterior abdominal peritoneal/omental fat stranding and nodularity which extends into a moderate volume right spigelian hernia as well as a small supraumbilical Richter ventral wall hernia. Associated small volume free fluid within both hernias. Similar chronic appearance of the associated fat and intraperitoneal fat which may be due to fat necrosis/ischemia; however, underlying peritoneal carcinomatosis or infection cannot be fully excluded. Limited evaluation on this noncontrast study. 2. No associated bowel obstruction with the Richter hernia. 3. Small volume simple free fluid ascites. 4.  Aortic Atherosclerosis (ICD10-I70.0). Electronically Signed   By: Iven Finn M.D.   On: 06/25/2022 18:05    EKG: I independently viewed the EKG done and my findings are as followed: None available at the time of this visit.  Assessment/Plan Present on Admission:  Abdominal pain  Principal Problem:   Abdominal pain  Abdominal pain, likely related to acute exacerbation of chronic ventral hernia, POA CT scan redemonstration of superior anterior abdominal peritoneal/omental fat stranding and nodularity which extends into moderate volume right spigelian hernia as well as a small  supraumbilical Richter ventral wall hernia.  No associated bowel obstruction with the Richter hernia, small volume simple free fluid ascites.  EDP discussed the case with general surgery Dr. Marlou Starks, who will see the patient in consultation.  Requested admission as observation.  Supportive care Analgesics as needed  UA is pending at the time of this dictation.  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   Status is: Observation    Kayleen Memos MD [facility] Pager [phone]  If 7PM-7AM, please contact night-coverage [email or web address] Password [facility]  06/25/2022, 11:36 PM

## 2022-06-25 NOTE — ED Triage Notes (Signed)
Pt presents via EMS from home with c/o abdominal pain for approx 5 days. Pt reports a hx of a hernia for about one year, last 5 days she has had increased pain. Pt also reports vomiting and diarrhea. Pt is incontinent to urine.

## 2022-06-25 NOTE — ED Notes (Signed)
Per PA, Josh repeat lactic acid not needed, result from first lactic acid 1.6.

## 2022-06-26 ENCOUNTER — Encounter (HOSPITAL_COMMUNITY): Payer: Self-pay | Admitting: Internal Medicine

## 2022-06-26 DIAGNOSIS — Z8249 Family history of ischemic heart disease and other diseases of the circulatory system: Secondary | ICD-10-CM | POA: Diagnosis not present

## 2022-06-26 DIAGNOSIS — E781 Pure hyperglyceridemia: Secondary | ICD-10-CM | POA: Diagnosis present

## 2022-06-26 DIAGNOSIS — I110 Hypertensive heart disease with heart failure: Secondary | ICD-10-CM | POA: Diagnosis present

## 2022-06-26 DIAGNOSIS — Z87892 Personal history of anaphylaxis: Secondary | ICD-10-CM | POA: Diagnosis not present

## 2022-06-26 DIAGNOSIS — Z96612 Presence of left artificial shoulder joint: Secondary | ICD-10-CM | POA: Diagnosis present

## 2022-06-26 DIAGNOSIS — G7000 Myasthenia gravis without (acute) exacerbation: Secondary | ICD-10-CM | POA: Diagnosis present

## 2022-06-26 DIAGNOSIS — I10 Essential (primary) hypertension: Secondary | ICD-10-CM | POA: Diagnosis not present

## 2022-06-26 DIAGNOSIS — R109 Unspecified abdominal pain: Secondary | ICD-10-CM | POA: Diagnosis not present

## 2022-06-26 DIAGNOSIS — Z85828 Personal history of other malignant neoplasm of skin: Secondary | ICD-10-CM | POA: Diagnosis not present

## 2022-06-26 DIAGNOSIS — Z8673 Personal history of transient ischemic attack (TIA), and cerebral infarction without residual deficits: Secondary | ICD-10-CM | POA: Diagnosis not present

## 2022-06-26 DIAGNOSIS — K432 Incisional hernia without obstruction or gangrene: Secondary | ICD-10-CM | POA: Diagnosis present

## 2022-06-26 DIAGNOSIS — R188 Other ascites: Secondary | ICD-10-CM | POA: Diagnosis present

## 2022-06-26 DIAGNOSIS — Z1152 Encounter for screening for COVID-19: Secondary | ICD-10-CM | POA: Diagnosis not present

## 2022-06-26 DIAGNOSIS — F32A Depression, unspecified: Secondary | ICD-10-CM | POA: Diagnosis present

## 2022-06-26 DIAGNOSIS — E039 Hypothyroidism, unspecified: Secondary | ICD-10-CM | POA: Diagnosis present

## 2022-06-26 DIAGNOSIS — K439 Ventral hernia without obstruction or gangrene: Secondary | ICD-10-CM | POA: Diagnosis present

## 2022-06-26 DIAGNOSIS — J4489 Other specified chronic obstructive pulmonary disease: Secondary | ICD-10-CM | POA: Diagnosis present

## 2022-06-26 DIAGNOSIS — Z96641 Presence of right artificial hip joint: Secondary | ICD-10-CM | POA: Diagnosis present

## 2022-06-26 DIAGNOSIS — I5032 Chronic diastolic (congestive) heart failure: Secondary | ICD-10-CM | POA: Diagnosis present

## 2022-06-26 DIAGNOSIS — M810 Age-related osteoporosis without current pathological fracture: Secondary | ICD-10-CM | POA: Diagnosis present

## 2022-06-26 DIAGNOSIS — F419 Anxiety disorder, unspecified: Secondary | ICD-10-CM | POA: Diagnosis present

## 2022-06-26 DIAGNOSIS — M159 Polyosteoarthritis, unspecified: Secondary | ICD-10-CM | POA: Diagnosis present

## 2022-06-26 DIAGNOSIS — Z823 Family history of stroke: Secondary | ICD-10-CM | POA: Diagnosis not present

## 2022-06-26 DIAGNOSIS — K5900 Constipation, unspecified: Secondary | ICD-10-CM | POA: Diagnosis present

## 2022-06-26 DIAGNOSIS — R112 Nausea with vomiting, unspecified: Secondary | ICD-10-CM | POA: Diagnosis not present

## 2022-06-26 DIAGNOSIS — K436 Other and unspecified ventral hernia with obstruction, without gangrene: Secondary | ICD-10-CM | POA: Diagnosis not present

## 2022-06-26 DIAGNOSIS — R197 Diarrhea, unspecified: Secondary | ICD-10-CM | POA: Diagnosis not present

## 2022-06-26 DIAGNOSIS — E876 Hypokalemia: Secondary | ICD-10-CM | POA: Diagnosis present

## 2022-06-26 DIAGNOSIS — C786 Secondary malignant neoplasm of retroperitoneum and peritoneum: Secondary | ICD-10-CM | POA: Diagnosis present

## 2022-06-26 LAB — COMPREHENSIVE METABOLIC PANEL
ALT: 14 U/L (ref 0–44)
AST: 21 U/L (ref 15–41)
Albumin: 3 g/dL — ABNORMAL LOW (ref 3.5–5.0)
Alkaline Phosphatase: 79 U/L (ref 38–126)
Anion gap: 8 (ref 5–15)
BUN: 6 mg/dL — ABNORMAL LOW (ref 8–23)
CO2: 26 mmol/L (ref 22–32)
Calcium: 8.8 mg/dL — ABNORMAL LOW (ref 8.9–10.3)
Chloride: 103 mmol/L (ref 98–111)
Creatinine, Ser: 0.52 mg/dL (ref 0.44–1.00)
GFR, Estimated: >60 mL/min (ref >60)
Glucose, Bld: 123 mg/dL — ABNORMAL HIGH (ref 70–99)
Potassium: 2.9 mmol/L — ABNORMAL LOW (ref 3.5–5.1)
Sodium: 137 mmol/L (ref 135–145)
Total Bilirubin: 0.9 mg/dL (ref 0.3–1.2)
Total Protein: 5.5 g/dL — ABNORMAL LOW (ref 6.5–8.1)

## 2022-06-26 LAB — CBC WITH DIFFERENTIAL/PLATELET
Abs Immature Granulocytes: 0.03 10*3/uL (ref 0.00–0.07)
Basophils Absolute: 0 10*3/uL (ref 0.0–0.1)
Basophils Relative: 1 %
Eosinophils Absolute: 0.1 10*3/uL (ref 0.0–0.5)
Eosinophils Relative: 2 %
HCT: 36.6 % (ref 36.0–46.0)
Hemoglobin: 11.8 g/dL — ABNORMAL LOW (ref 12.0–15.0)
Immature Granulocytes: 1 %
Lymphocytes Relative: 16 %
Lymphs Abs: 1 10*3/uL (ref 0.7–4.0)
MCH: 28.2 pg (ref 26.0–34.0)
MCHC: 32.2 g/dL (ref 30.0–36.0)
MCV: 87.4 fL (ref 80.0–100.0)
Monocytes Absolute: 0.7 10*3/uL (ref 0.1–1.0)
Monocytes Relative: 10 %
Neutro Abs: 4.6 10*3/uL (ref 1.7–7.7)
Neutrophils Relative %: 70 %
Platelets: 297 10*3/uL (ref 150–400)
RBC: 4.19 MIL/uL (ref 3.87–5.11)
RDW: 12.9 % (ref 11.5–15.5)
WBC: 6.5 10*3/uL (ref 4.0–10.5)
nRBC: 0 % (ref 0.0–0.2)

## 2022-06-26 LAB — URINALYSIS, ROUTINE W REFLEX MICROSCOPIC
Bilirubin Urine: NEGATIVE
Glucose, UA: NEGATIVE mg/dL
Hgb urine dipstick: NEGATIVE
Ketones, ur: NEGATIVE mg/dL
Leukocytes,Ua: NEGATIVE
Nitrite: NEGATIVE
Protein, ur: NEGATIVE mg/dL
Specific Gravity, Urine: <1.005 — ABNORMAL LOW (ref 1.005–1.030)
pH: 7 (ref 5.0–8.0)

## 2022-06-26 LAB — BASIC METABOLIC PANEL
Anion gap: 8 (ref 5–15)
BUN: <5 mg/dL — ABNORMAL LOW (ref 8–23)
CO2: 24 mmol/L (ref 22–32)
Calcium: 9.2 mg/dL (ref 8.9–10.3)
Chloride: 104 mmol/L (ref 98–111)
Creatinine, Ser: 0.56 mg/dL (ref 0.44–1.00)
GFR, Estimated: >60 mL/min (ref >60)
Glucose, Bld: 128 mg/dL — ABNORMAL HIGH (ref 70–99)
Potassium: 4.3 mmol/L (ref 3.5–5.1)
Sodium: 136 mmol/L (ref 135–145)

## 2022-06-26 LAB — MAGNESIUM: Magnesium: 2 mg/dL (ref 1.7–2.4)

## 2022-06-26 LAB — PHOSPHORUS: Phosphorus: 2.8 mg/dL (ref 2.5–4.6)

## 2022-06-26 MED ORDER — POTASSIUM CHLORIDE 10 MEQ/100ML IV SOLN
INTRAVENOUS | Status: AC
  Filled 2022-06-26: qty 100

## 2022-06-26 MED ORDER — POLYETHYLENE GLYCOL 3350 17 G PO PACK: 17.0000 g | PACK | Freq: Every day | ORAL | Status: DC | PRN

## 2022-06-26 MED ORDER — LIP MEDEX EX OINT
TOPICAL_OINTMENT | Freq: Once | CUTANEOUS | Status: AC
  Administered 2022-06-26: 75 via TOPICAL
  Filled 2022-06-26: qty 7

## 2022-06-26 MED ORDER — POTASSIUM CHLORIDE 10 MEQ/100ML IV SOLN
INTRAVENOUS | Status: AC
  Administered 2022-06-26: 10 meq
  Filled 2022-06-26: qty 100

## 2022-06-26 MED ORDER — MELATONIN 5 MG PO TABS: 5.0000 mg | ORAL_TABLET | Freq: Every evening | ORAL | Status: DC | PRN

## 2022-06-26 MED ORDER — PROCHLORPERAZINE EDISYLATE 10 MG/2ML IJ SOLN
5.0000 mg | Freq: Four times a day (QID) | INTRAMUSCULAR | Status: DC | PRN
  Administered 2022-06-26: 5 mg via INTRAVENOUS
  Filled 2022-06-26: qty 2

## 2022-06-26 MED ORDER — POTASSIUM CHLORIDE 10 MEQ/100ML IV SOLN
10.0000 meq | INTRAVENOUS | Status: AC
  Administered 2022-06-26 (×5): 10 meq via INTRAVENOUS
  Filled 2022-06-26 (×4): qty 100

## 2022-06-26 MED ORDER — OXYCODONE HCL 5 MG PO TABS: 5.0000 mg | ORAL_TABLET | Freq: Four times a day (QID) | ORAL | Status: DC | PRN

## 2022-06-26 MED ORDER — TRAZODONE HCL 50 MG PO TABS
50.0000 mg | ORAL_TABLET | Freq: Every day | ORAL | Status: AC
  Administered 2022-06-26: 50 mg via ORAL
  Filled 2022-06-26: qty 1

## 2022-06-26 MED ORDER — HYDROMORPHONE HCL 1 MG/ML IJ SOLN: 0.5000 mg | INTRAMUSCULAR | Status: DC | PRN

## 2022-06-26 MED ORDER — ACETAMINOPHEN 325 MG PO TABS: 650.0000 mg | ORAL_TABLET | Freq: Four times a day (QID) | ORAL | Status: DC | PRN

## 2022-06-26 NOTE — Progress Notes (Addendum)
PROGRESS NOTE    Sabrina [name]  TIR:[id number]  DOB: 04-21-1941  DOA: 06/25/2022 PCP: Ginger Organ., MD Outpatient Specialists:   Hospital course:  81 year old female was admitted with abdominal pain thought to be secondary to spigelian hernia.  CT showsredemonstration of superior anterior abdominal peritoneal/omental fat stranding and nodularity which extends into moderate volume right spigelian hernia as well as a small supraumbilical Richter ventral wall hernia.  Subjective:  Patient seen in conjunction with her daughter.  Patient's daughter states that she sometimes has some memory issues.  Patient states she feels well, has no further abdominal pain.  Patient's daughter states that she has had on and off abdominal pain for the past week with some alternating constipation and diarrhea.  No fevers.   Objective: Vitals:   06/26/22 0300 06/26/22 0630 06/26/22 0633 06/26/22 1115  BP: (!) 153/65 (!) 174/68  (!) 161/107  Pulse: (!) 58 74  62  Resp: '18 16  17  '$ Temp:   98.5 F (36.9 C) 98.4 F (36.9 C)  TempSrc:   Oral Oral  SpO2: 96% 95%  97%   No intake or output data in the 24 hours ending 06/26/22 1226 There were no vitals filed for this visit.   Exam:  General: Talkative female with protuberant abdomen sitting up in bed in NAD. Eyes: sclera anicteric, conjuctiva mild injection bilaterally CVS: S1-S2, regular  Respiratory:  decreased air entry bilaterally secondary to decreased inspiratory effort, rales at bases, Gallion hernia is palpable and somewhat tender.  NABS. GI: Abdomen is protuberant, it is soft, there is a well-healed RUQ scar LE: No edema.    Assessment & Plan:   Spighealian hernia Seen by general surgery earlier who are recommending conservative management. And daughter are agreeable to this.  CT with some nodularity and inflammation of her omental fat Unclear cause of possible panniculitis versus carcinomatosis with some ascites Will  request VIR consultation for further work-up as is warranted--communication from VIR: Recommendations are for outpatient biopsy, this will need to be scheduled upon discharge.  Hypokalemia Patient will need ongoing aggressive repletion, follow closely, repeat tonight after supplementation  Anxiety and depression Continue fluoxetine  COPD No evidence for acute flare   DVT prophylaxis: Lovenox Code Status: Full Family Communication: Patient's daughter was at bedside throughout Disposition Plan:   Patient is from: Home  Anticipated Discharge Location: Home  Barriers to Discharge: Hypokalemia, ongoing work-up of possible panniculitis  Is patient medically stable for Discharge: Not yet   Scheduled Meds:  aspirin EC  81 mg Oral Daily   enoxaparin (LOVENOX) injection  40 mg Subcutaneous Q24H   FLUoxetine  20 mg Oral Daily   fluticasone furoate-vilanterol  1 puff Inhalation Daily   And   umeclidinium bromide  1 puff Inhalation Daily   levothyroxine  75 mcg Oral Q0600   montelukast  10 mg Oral QHS   multivitamin with minerals  1 tablet Oral Daily   mycophenolate  1,000 mg Oral BID   omega-3 acid ethyl esters  1,000 mg Oral Daily   Continuous Infusions:  lactated ringers 1,000 mL with potassium chloride 40 mEq infusion 75 mL/hr at 06/26/22 0106    Data Reviewed:  Basic Metabolic Panel: Recent Labs  Lab 06/25/22 1646 06/26/22 0118  NA 137 137  K 2.8* 2.9*  CL 103 103  CO2 27 26  GLUCOSE 122* 123*  BUN 6* 6*  CREATININE 0.57 0.52  CALCIUM 9.0 8.8*  MG  --  2.0  PHOS  --  2.8    CBC: Recent Labs  Lab 06/25/22 1646 06/26/22 0118  WBC 7.2 6.5  NEUTROABS 5.6 4.6  HGB 13.0 11.8*  HCT 40.2 36.6  MCV 87.2 87.4  PLT 289 297    Studies: CT ABDOMEN PELVIS WO CONTRAST  Result Date: 06/25/2022 CLINICAL DATA:  LLQ abdominal pain abdominal pain, hernias EXAM: CT ABDOMEN AND PELVIS WITHOUT CONTRAST TECHNIQUE: Multidetector CT imaging of the abdomen and pelvis was  performed following the standard protocol without IV contrast. RADIATION DOSE REDUCTION: This exam was performed according to the departmental dose-optimization program which includes automated exposure control, adjustment of the mA and/or kV according to patient size and/or use of iterative reconstruction technique. COMPARISON:  CT abdomen pelvis 04/21/2022 FINDINGS: Lower chest: Bilateral lower lobe mild bronchiectasis and architectural distortion likely due to prior prior infection. Hepatobiliary: No focal liver abnormality. Status post cholecystectomy. No biliary dilatation. Pancreas: Diffusely atrophic. No focal lesion. Otherwise normal pancreatic contour. No surrounding inflammatory changes. No main pancreatic ductal dilatation. Spleen: Normal in size without focal abnormality. Adrenals/Urinary Tract: No adrenal nodule bilaterally. Bilateral renal cortical scarring. No nephrolithiasis and no hydronephrosis. No definite contour-deforming renal mass. No ureterolithiasis or hydroureter. The urinary bladder is unremarkable. Stomach/Bowel: Stomach is within normal limits. No evidence of bowel wall thickening or dilatation. The appendix is not definitely identified with no inflammatory changes in the right lower quadrant to suggest acute appendicitis. Vascular/Lymphatic: No abdominal aorta or iliac aneurysm. Mild atherosclerotic plaque of the aorta and its branches. No abdominal, pelvic, or inguinal lymphadenopathy. Reproductive: Status post hysterectomy. No adnexal masses. Other: Redemonstration of superior anterior abdominal omental/peritoneal fat stranding and nodularity which extends into a moderate volume right spigelian hernia as well as a small supraumbilical Richter ventral wall hernia. The spigelian hernia as an abdominal defect of approximately 3.3 cm (2:38). The Richter ventral hernia demonstrates an abdominal defect of approximately 3.8 cm in contains a short segment of anti mesenteric transverse colon  wall (2:23). Small volume simple free fluid ascites. No free intraperitoneal gas. No abscess formation. Musculoskeletal: No abdominal wall hernia or abnormality. No suspicious lytic or blastic osseous lesions. No acute displaced fracture. Grade 1 anterolisthesis of L4 on L5 status post bilateral posterolateral and interbody fusion surgical hardware. Multilevel degenerative changes of the spine. Partially visualized total right hip arthroplasty. Severe degenerative changes of the left hip. IMPRESSION: 1. Redemonstration of superior anterior abdominal peritoneal/omental fat stranding and nodularity which extends into a moderate volume right spigelian hernia as well as a small supraumbilical Richter ventral wall hernia. Associated small volume free fluid within both hernias. Similar chronic appearance of the associated fat and intraperitoneal fat which may be due to fat necrosis/ischemia; however, underlying peritoneal carcinomatosis or infection cannot be fully excluded. Limited evaluation on this noncontrast study. 2. No associated bowel obstruction with the Richter hernia. 3. Small volume simple free fluid ascites. 4.  Aortic Atherosclerosis (ICD10-I70.0). Electronically Signed   By: Iven Finn M.D.   On: 06/25/2022 18:05    Principal Problem:   Abdominal pain     Dewaine Oats Derek Jack, [facility]  If 7PM-7AM, please contact night-coverage [email or web address]   LOS: 0 days

## 2022-06-26 NOTE — Consult Note (Signed)
Sabrina [name] Jan 12, 1941  [id number].    Requesting MD: Dr. 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  This will intermittently cause some soreness and discomfort but will usually go away.  Recently her primary care provider noticed that this was more hard in nature and referred her to see Dr. Ninfa Linden.  A CT scan was ordered at that time which revealed these 2 chronic hernias that she has known about with omentum present specifically in the lateral hernia.  She did have increased attenuation of the mesentery/omentum that may be reflective of chronic or acute inflammation along with a small amount of ascites at that time.  The patient has significant issues with anesthesia and given no bowel was incarcerated on this imaging, the decision was made at that time to hold on pursuing surgical intervention.  Over the last week or so the patient has been having an increase in nausea, vomiting, and diarrhea after taking a stool softener for constipation.  She has had an increase in abdominal pain as well as distention.  Her daughter brought her to the emergency department for evaluation.  She underwent repeat imaging which redemonstrated many of these chronic findings consistent with peritoneal and omental fat stranding along with moderate volume of ascites.  Given the stranding and abnormal appearance of the omentum and the peritoneum, underlying peritoneal carcinomatosis or infection cannot be fully excluded.  The patient has not been febrile at home.  There are no histories of GI cancer in her family.  She currently is not having any significant abdominal pain and her nausea, vomiting, and diarrhea have been slowly improving over the last couple of days.  We have been asked to evaluate her for further  recommendations regarding her hernias.  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   Depression    History of skin cancer    HSV (herpes simplex virus) infection    CHEST   Hyperlipidemia    Hypertension    PT HAS LOST WEIGHT--B/P NOW WITHIN NORMALS--IS ON B/P MEDICINE   Hypertriglyceridemia    Hypothyroidism    Myasthenia gravis    DOING WELL ON CELLCEPT-FOLLOWED BY DR. LISA HOBSON - [facility] NEUROLOGIST   OA (osteoarthritis) of hip    Osteopenia    Osteoporosis    Pain  LEFT SHOULDER PAIN-TORN ROTATOR CUFF-VERY LIMITED ROM   Pain    TOP OF RIGHT SHOULDER--ROM OK AT PRESENT   Pneumonia    Rash, skin    LOWER ABDOMEN-FUNGAL INFECTION-PT HAS TOPICAL OINTMENT TO USE AS NEEDED.   Seasonal allergies    Thyroid disease     Past Surgical History:  Procedure Laterality Date   BELPHAROPTOSIS REPAIR  01/2006   PTOSIS REPAIR BILATERAL   CARPAL TUNNEL RELEASE  05/31/2012   Procedure: CARPAL TUNNEL RELEASE;  Surgeon: Mcarthur Rossetti, MD;  Location: [facility];  Service: Orthopedics;  Laterality: Left;  Left Open Carpal Tunnel Release   CARPAL TUNNEL RELEASE  10/10/2012   Procedure:  CARPAL TUNNEL RELEASE;  Surgeon: Nita Sells, MD;  Location: [facility];  Service: Orthopedics;  Laterality: Left;   CEREBRAL ANEURYSM REPAIR  10/2005   GUGLIEMI COILS TO REPAIR ANEURYSM; NO RESIDUAL PROBLEMS--HAS FOLLOW UP MRI EVERY 1 OR 2 YRS   CHOLECYSTECTOMY     EXTREME DROP IN BLOOD PRESSURE WITH THIS SURGERY- YRS AGO- PT WAS 81 YRS OLD   JOINT REPLACEMENT  10/19/09   RT HIP--SEVERE TREMORS, FLAILING OF ARMS AFTER THIS SURGERY.   left shoulder arthroscopy      REVERSE SHOULDER ARTHROPLASTY  10/10/2012   Procedure: REVERSE SHOULDER ARTHROPLASTY;  Surgeon: Nita Sells, MD;  Location: [facility];  Service: Orthopedics;  Laterality: Left;   TONSILLECTOMY AND ADENOIDECTOMY     TOTAL HIP ARTHROPLASTY     right   TUBAL LIGATION     ANAPHYLACTIC SHOCK AFTER THIS SURGERY-CAUSE THOUGHT TO  BE RELATED TO SODIUM PENTOTHAL    Social History:  reports that she has never smoked. She has never used smokeless tobacco. She reports current alcohol use. She reports that she does not use drugs.  Allergies:  Allergies  Allergen Reactions   Azathioprine Anaphylaxis and Other (See Comments)    Kidneys shut down   Other Anaphylaxis    Sodium pentathol Uncoded Allergy. Allergen: Sodium pentathol   Aminoglycosides     Tobramycin, gentamycin, kanamycin, neomycin, streptomycin Can't take because of myasthenia gravis   Avelox [Moxifloxacin Hcl In Nacl] Other (See Comments)    Can't take because of myasthenia gravis   Beta Adrenergic Blockers     Proponolol, timolol maleate eyedrops Can't take because of myasthenia gravis   Calcium Channel Blockers     Blood pressure medications Can't take because of myasthenia gravis   Cephalosporins     Can't take because of myasthenia gravis   Ciprofloxacin Other (See Comments)    Can't take because of myasthenia gravis Can't take because of myasthenia gravis   Clindamycin/Lincomycin     May exascerbate myasthenia gravis   Colistin     Can't take  because of myasthenia gravis   Fenofibrate     Other reaction(s): GI upset, myalgia   Fosamax [Alendronate Sodium]     LEG WEAKNESS   Imuran [Azathioprine Sodium] Other (See Comments)    Kidneys shut down   Iodinated Contrast Media Other (See Comments)    Contraindicated to Myasthenia Gravis   Iodine     Iodine contrast Can't take because of myasthenia gravis   Macrolides And Ketolides     Erythromocin, azithromycin, telithromycin Can't take because of myasthenia gravis   Magnesium-Containing Compounds     Including milk of magnesia, antacids containing magnesium hydroxide (maalox, mylanta) and epsom salts Can't take because of myasthenia gravis   Moxifloxacin Other (See Comments)    Can't take because of  myasthenia gravis   Niacin And Related     Other reaction(s): leg aches   Norfloxacin Other (See Comments)    Can't take because of myasthenia gravis Can't take because of myasthenia gravis   Ofloxacin Other (See Comments)    Can't take because of myasthenia gravis Can't take because of myasthenia gravis   Pefloxacin Other (See Comments)    Can't take because of myasthenia gravis Can't take because of myasthenia gravis   Penicillamine     do not use due to Myasthenia Gravis     Prednisolone Nausea And Vomiting   Prednisone Nausea And Vomiting   Procainamide     Can't take because of myasthenia gravis   Quinidine     Can't take because of myasthenia gravis   Quinine Derivatives     Can't take because of myasthenia gravis   Succinylcholine     Can't take because of myasthenia gravis No paralytics   Tubocurarine     Can't take because of myasthenia gravis   Vecuronium     Can't take because of myasthenia gravis No paralytics   Vicodin [Hydrocodone-Acetaminophen] Other (See Comments)    Pt suffers from bad headaches    (Not in a hospital admission)    Physical Exam: Blood pressure (!) 174/68, pulse 74, temperature 98.5 F (36.9 C), temperature source Oral,  resp. rate 16, SpO2 95 %. General: pleasant, WD, WN white female who is laying in bed in NAD HEENT: head is normocephalic, atraumatic.  Sclera are noninjected.  PERRL.  Ears and nose without any masses or lesions.  Mouth is pink and moist Heart: regular, rate, and rhythm.  Normal s1,s2. No obvious murmurs, gallops, or rubs noted.  Palpable radial and pedal pulses bilaterally Lungs: CTAB, no wheezes, rhonchi, or rales noted.  Respiratory effort nonlabored Abd: soft, mildly tender around the lateral Spigelian hernia with deep palpation only, the omentum is unable to be reduced and is certainly firm/hard in nature.  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  Some distention, +BS, no masses otherwise noted Psych: A&Ox3 with an appropriate affect.   Results for orders placed or performed during the hospital encounter of 06/25/22 (from the past 48 hour(s))  Urinalysis, Routine w reflex microscopic Urine, Clean Catch     Status: Abnormal   Collection Time: 06/25/22  7:08 AM  Result Value Ref Range   Color, Urine YELLOW YELLOW   APPearance CLEAR CLEAR   Specific Gravity, Urine <1.005 (L) 1.005 - 1.030   pH 7.0 5.0 - 8.0   Glucose, UA NEGATIVE NEGATIVE mg/dL   Hgb urine dipstick NEGATIVE NEGATIVE   Bilirubin Urine NEGATIVE NEGATIVE   Ketones, ur NEGATIVE NEGATIVE mg/dL   Protein, ur NEGATIVE NEGATIVE mg/dL   Nitrite NEGATIVE NEGATIVE   Leukocytes,Ua NEGATIVE NEGATIVE    Comment: Microscopic not done on urines with negative protein, blood, leukocytes, nitrite, or glucose < 500 mg/dL. Performed at [facility], [place] [address], [place], [place] [address]   CBC with Differential     Status: None   Collection Time: 06/25/22  4:46 PM  Result Value Ref Range   WBC 7.2 4.0 - 10.5 K/uL   RBC 4.61 3.87 - 5.11 MIL/uL   Hemoglobin 13.0 12.0 - 15.0 g/dL   HCT 40.2 36.0 - 46.0 %   MCV 87.2 80.0 - 100.0 fL   MCH 28.2 26.0 - 34.0 pg   MCHC 32.3 30.0 - 36.0 g/dL   RDW 13.1 11.5 - 15.5  %  Platelets 289 150 - 400 K/uL   nRBC 0.0 0.0 - 0.2 %   Neutrophils Relative % 79 %   Neutro Abs 5.6 1.7 - 7.7 K/uL   Lymphocytes Relative 12 %   Lymphs Abs 0.8 0.7 - 4.0 K/uL   Monocytes Relative 8 %   Monocytes Absolute 0.6 0.1 - 1.0 K/uL   Eosinophils Relative 1 %   Eosinophils Absolute 0.1 0.0 - 0.5 K/uL   Basophils Relative 0 %   Basophils Absolute 0.0 0.0 - 0.1 K/uL   Immature Granulocytes 0 %   Abs Immature Granulocytes 0.03 0.00 - 0.07 K/uL    Comment: Performed at [facility], [place] [address] [place], [place] [address]  Comprehensive metabolic panel     Status: Abnormal   Collection Time: 06/25/22  4:46 PM  Result Value Ref Range   Sodium 137 135 - 145 mmol/L   Potassium 2.8 (L) 3.5 - 5.1 mmol/L   Chloride 103 98 - 111 mmol/L   CO2 27 22 - 32 mmol/L   Glucose, Bld 122 (H) 70 - 99 mg/dL    Comment: Glucose reference range applies only to samples taken after fasting for at least 8 hours.   BUN 6 (L) 8 - 23 mg/dL   Creatinine, Ser 0.57 0.44 - 1.00 mg/dL   Calcium 9.0 8.9 - 10.3 mg/dL   Total Protein 5.9 (L) 6.5 - 8.1 g/dL   Albumin 3.3 (L) 3.5 - 5.0 g/dL   AST 24 15 - 41 U/L   ALT 16 0 - 44 U/L   Alkaline Phosphatase 89 38 - 126 U/L   Total Bilirubin 1.1 0.3 - 1.2 mg/dL   GFR, Estimated >60 >60 mL/min    Comment: (NOTE) Calculated using the CKD-EPI Creatinine Equation (2021)    Anion gap 7 5 - 15    Comment: Performed at [facility], [place] [address] [place], [place] [address]  Lipase, blood     Status: None   Collection Time: 06/25/22  4:46 PM  Result Value Ref Range   Lipase 22 11 - 51 U/L    Comment: Performed at [facility], [place] [address] [place], [place] [address]  Lactic acid, plasma     Status: None   Collection Time: 06/25/22  6:55 PM  Result Value Ref Range   Lactic Acid, Venous 1.6 0.5 - 1.9 mmol/L    Comment: Performed at [facility], [place] [address], [place],  [place] [address]  CBC with Differential/Platelet     Status: Abnormal   Collection Time: 06/26/22  1:18 AM  Result Value Ref Range   WBC 6.5 4.0 - 10.5 K/uL   RBC 4.19 3.87 - 5.11 MIL/uL   Hemoglobin 11.8 (L) 12.0 - 15.0 g/dL   HCT 36.6 36.0 - 46.0 %   MCV 87.4 80.0 - 100.0 fL   MCH 28.2 26.0 - 34.0 pg   MCHC 32.2 30.0 - 36.0 g/dL   RDW 12.9 11.5 - 15.5 %   Platelets 297 150 - 400 K/uL   nRBC 0.0 0.0 - 0.2 %   Neutrophils Relative % 70 %   Neutro Abs 4.6 1.7 - 7.7 K/uL   Lymphocytes Relative 16 %   Lymphs Abs 1.0 0.7 - 4.0 K/uL   Monocytes Relative 10 %   Monocytes Absolute 0.7 0.1 - 1.0 K/uL   Eosinophils Relative 2 %   Eosinophils Absolute 0.1 0.0 - 0.5 K/uL   Basophils Relative 1 %  Basophils Absolute 0.0 0.0 - 0.1 K/uL   Immature Granulocytes 1 %   Abs Immature Granulocytes 0.03 0.00 - 0.07 K/uL    Comment: Performed at [facility], [place] [address] [place], [place] [address]  Comprehensive metabolic panel     Status: Abnormal   Collection Time: 06/26/22  1:18 AM  Result Value Ref Range   Sodium 137 135 - 145 mmol/L   Potassium 2.9 (L) 3.5 - 5.1 mmol/L   Chloride 103 98 - 111 mmol/L   CO2 26 22 - 32 mmol/L   Glucose, Bld 123 (H) 70 - 99 mg/dL    Comment: Glucose reference range applies only to samples taken after fasting for at least 8 hours.   BUN 6 (L) 8 - 23 mg/dL   Creatinine, Ser 0.52 0.44 - 1.00 mg/dL   Calcium 8.8 (L) 8.9 - 10.3 mg/dL   Total Protein 5.5 (L) 6.5 - 8.1 g/dL   Albumin 3.0 (L) 3.5 - 5.0 g/dL   AST 21 15 - 41 U/L   ALT 14 0 - 44 U/L   Alkaline Phosphatase 79 38 - 126 U/L   Total Bilirubin 0.9 0.3 - 1.2 mg/dL   GFR, Estimated >60 >60 mL/min    Comment: (NOTE) Calculated using the CKD-EPI Creatinine Equation (2021)    Anion gap 8 5 - 15    Comment: Performed at [facility], [place] [address], [place], [place] [address]  Magnesium     Status: None   Collection Time: 06/26/22  1:18 AM  Result Value Ref Range    Magnesium 2.0 1.7 - 2.4 mg/dL    Comment: Performed at [facility], [place] [address] [place], [place] [address]  Phosphorus     Status: None   Collection Time: 06/26/22  1:18 AM  Result Value Ref Range   Phosphorus 2.8 2.5 - 4.6 mg/dL    Comment: Performed at [facility], [place] [address], [place], [place] [address]   CT ABDOMEN PELVIS WO CONTRAST  Result Date: 06/25/2022 CLINICAL DATA:  LLQ abdominal pain abdominal pain, hernias EXAM: CT ABDOMEN AND PELVIS WITHOUT CONTRAST TECHNIQUE: Multidetector CT imaging of the abdomen and pelvis was performed following the standard protocol without IV contrast. RADIATION DOSE REDUCTION: This exam was performed according to the departmental dose-optimization program which includes automated exposure control, adjustment of the mA and/or kV according to patient size and/or use of iterative reconstruction technique. COMPARISON:  CT abdomen pelvis 04/21/2022 FINDINGS: Lower chest: Bilateral lower lobe mild bronchiectasis and architectural distortion likely due to prior prior infection. Hepatobiliary: No focal liver abnormality. Status post cholecystectomy. No biliary dilatation. Pancreas: Diffusely atrophic. No focal lesion. Otherwise normal pancreatic contour. No surrounding inflammatory changes. No main pancreatic ductal dilatation. Spleen: Normal in size without focal abnormality. Adrenals/Urinary Tract: No adrenal nodule bilaterally. Bilateral renal cortical scarring. No nephrolithiasis and no hydronephrosis. No definite contour-deforming renal mass. No ureterolithiasis or hydroureter. The urinary bladder is unremarkable. Stomach/Bowel: Stomach is within normal limits. No evidence of bowel wall thickening or dilatation. The appendix is not definitely identified with no inflammatory changes in the right lower quadrant to suggest acute appendicitis. Vascular/Lymphatic: No abdominal aorta or iliac aneurysm. Mild atherosclerotic  plaque of the aorta and its branches. No abdominal, pelvic, or inguinal lymphadenopathy. Reproductive: Status post hysterectomy. No adnexal masses. Other: Redemonstration of superior anterior abdominal omental/peritoneal fat stranding and nodularity which extends into a moderate volume right spigelian hernia as well as a small supraumbilical  Richter ventral wall hernia. The spigelian hernia as an abdominal defect of approximately 3.3 cm (2:38). The Richter ventral hernia demonstrates an abdominal defect of approximately 3.8 cm in contains a short segment of anti mesenteric transverse colon wall (2:23). Small volume simple free fluid ascites. No free intraperitoneal gas. No abscess formation. Musculoskeletal: No abdominal wall hernia or abnormality. No suspicious lytic or blastic osseous lesions. No acute displaced fracture. Grade 1 anterolisthesis of L4 on L5 status post bilateral posterolateral and interbody fusion surgical hardware. Multilevel degenerative changes of the spine. Partially visualized total right hip arthroplasty. Severe degenerative changes of the left hip. IMPRESSION: 1. Redemonstration of superior anterior abdominal peritoneal/omental fat stranding and nodularity which extends into a moderate volume right spigelian hernia as well as a small supraumbilical Richter ventral wall hernia. Associated small volume free fluid within both hernias. Similar chronic appearance of the associated fat and intraperitoneal fat which may be due to fat necrosis/ischemia; however, underlying peritoneal carcinomatosis or infection cannot be fully excluded. Limited evaluation on this noncontrast study. 2. No associated bowel obstruction with the Richter hernia. 3. Small volume simple free fluid ascites. 4.  Aortic Atherosclerosis (ICD10-I70.0). Electronically Signed   By: Iven Finn M.D.   On: 06/25/2022 18:05      Assessment/Plan Spigelian and Richter's hernia The patient has been seen, examined, chart,  labs, vitals, and imaging personally reviewed.  She does have evidence of these 2 hernias but there is no bowel present to suggest bowel compromise or these being the etiology of her nausea, vomiting, and diarrhea.  She does have findings of omental thickening and ascites.  The omentum that is present in her hernia is certainly more firm than usually expected.  It would likely be very prudent to further work this up whether this is of infectious etiology versus underlying malignancy.  The patient does not want an operation which is very reasonable given there is no bowel compromise and with the CT scan findings including ascites and inflammatory changes, repair would not be recommended at this time anyway.  I have discussed this with the attending service.  Surgically, the patient is stable for discharge when okay from a medical standpoint.  She may follow-up with Dr. Ninfa Linden as needed regarding these hernias.  We will sign off at this time.  Nausea, vomiting, diarrhea Unclear of the etiology.  Will defer to medical service.  It is [place] the patient had some type of gastroenteritis recently however her CT scan from August of this year does show some inflammatory changes of her omentum and a small amount of ascites at that time as well.  This likely needs to be further evaluated.  I reviewed ED provider notes, hospitalist notes, last 24 h vitals and pain scores, last 48 h intake and output, last 24 h labs and trends, and last 24 h imaging results.  Henreitta Cea, [facility] Surgery 06/26/2022, 10:53 AM Please see Amion for pager number during day hours 7:00am-4:30pm or 7:00am -11:30am on weekends

## 2022-06-27 ENCOUNTER — Encounter (HOSPITAL_COMMUNITY): Payer: Self-pay

## 2022-06-27 ENCOUNTER — Ambulatory Visit (HOSPITAL_COMMUNITY): Payer: Medicare Other

## 2022-06-27 ENCOUNTER — Other Ambulatory Visit: Payer: Self-pay

## 2022-06-27 DIAGNOSIS — R109 Unspecified abdominal pain: Secondary | ICD-10-CM | POA: Diagnosis not present

## 2022-06-27 DIAGNOSIS — E039 Hypothyroidism, unspecified: Secondary | ICD-10-CM | POA: Diagnosis not present

## 2022-06-27 DIAGNOSIS — I10 Essential (primary) hypertension: Secondary | ICD-10-CM | POA: Diagnosis not present

## 2022-06-27 DIAGNOSIS — K439 Ventral hernia without obstruction or gangrene: Principal | ICD-10-CM

## 2022-06-27 DIAGNOSIS — G7000 Myasthenia gravis without (acute) exacerbation: Secondary | ICD-10-CM

## 2022-06-27 DIAGNOSIS — I5032 Chronic diastolic (congestive) heart failure: Secondary | ICD-10-CM | POA: Diagnosis not present

## 2022-06-27 LAB — BASIC METABOLIC PANEL
Anion gap: 7 (ref 5–15)
BUN: 5 mg/dL — ABNORMAL LOW (ref 8–23)
CO2: 24 mmol/L (ref 22–32)
Calcium: 9 mg/dL (ref 8.9–10.3)
Chloride: 104 mmol/L (ref 98–111)
Creatinine, Ser: 0.46 mg/dL (ref 0.44–1.00)
GFR, Estimated: >60 mL/min (ref >60)
Glucose, Bld: 131 mg/dL — ABNORMAL HIGH (ref 70–99)
Potassium: 3.9 mmol/L (ref 3.5–5.1)
Sodium: 135 mmol/L (ref 135–145)

## 2022-06-27 LAB — CBC
HCT: 39.4 % (ref 36.0–46.0)
Hemoglobin: 12.7 g/dL (ref 12.0–15.0)
MCH: 28 pg (ref 26.0–34.0)
MCHC: 32.2 g/dL (ref 30.0–36.0)
MCV: 86.8 fL (ref 80.0–100.0)
Platelets: 310 10*3/uL (ref 150–400)
RBC: 4.54 MIL/uL (ref 3.87–5.11)
RDW: 13.2 % (ref 11.5–15.5)
WBC: 10.7 10*3/uL — ABNORMAL HIGH (ref 4.0–10.5)
nRBC: 0 % (ref 0.0–0.2)

## 2022-06-27 LAB — MAGNESIUM: Magnesium: 1.8 mg/dL (ref 1.7–2.4)

## 2022-06-27 LAB — C-REACTIVE PROTEIN: CRP: 2.2 mg/dL — ABNORMAL HIGH (ref <1.0)

## 2022-06-27 MED ORDER — IRBESARTAN 150 MG PO TABS
150.0000 mg | ORAL_TABLET | Freq: Every day | ORAL | Status: DC
  Administered 2022-06-27: 150 mg via ORAL
  Filled 2022-06-27: qty 1

## 2022-06-27 MED ORDER — VITAMIN D-3 25 MCG (1000 UT) PO CAPS: 1.0000 | ORAL_CAPSULE | Freq: Every morning | ORAL | Status: AC

## 2022-06-27 MED ORDER — ORAL CARE MOUTH RINSE: 15.0000 mL | OROMUCOSAL | Status: DC | PRN

## 2022-06-27 MED ORDER — IRBESARTAN 150 MG PO TABS
150.0000 mg | ORAL_TABLET | Freq: Every day | ORAL | Status: AC
  Administered 2022-06-27: 150 mg via ORAL
  Filled 2022-06-27: qty 1

## 2022-06-27 MED ORDER — SENNA 8.6 MG PO TABS: 2.0000 | ORAL_TABLET | Freq: Every day | ORAL | 1 refills | Status: AC

## 2022-06-27 MED ORDER — POLYETHYLENE GLYCOL 3350 17 GM/SCOOP PO POWD: 1.0000 | Freq: Every day | ORAL | 0 refills | Status: AC | PRN

## 2022-06-27 MED ORDER — VALSARTAN 160 MG PO TABS: 160.0000 mg | ORAL_TABLET | Freq: Every day | ORAL | 2 refills | Status: AC

## 2022-06-27 NOTE — TOC Progression Note (Signed)
Transition of Care [facility]) - Progression Note    Patient Details  Name: Sabrina [name] MRN: [id number] Date of Birth: 11-26-1940  Transition of Care [facility]) CM/SW [place], RN Phone Number: 06/27/2022, 2:47 PM  Clinical Narrative:   Spoke with patient who reports she does not have any problems getting food. Patient reports she had food stamps however since her SSI increased she no longer gets food stamps, however not having difficulty getting food. Patient reports she has HHPT however unsure of which agency, reports her daughter handles. Patient reports her daughter will transport at discharge. This RNCM notified Malachy Mood with [facility] who is currently following for HHPT. MD notified.   No additional TOC needs at this time.    Expected Discharge Plan: [place] Barriers to Discharge: No Barriers Identified  Expected Discharge Plan and Services Expected Discharge Plan: [place] In-house Referral: NA Discharge Planning Services: CM Consult Post Acute Care Choice: [place] arrangements for the past 2 months: Apartment Expected Discharge Date: 06/27/22               DME Arranged: N/A DME Agency: NA       HH Arranged: PT HH Agency: [place] Date [place]: 06/27/22 Time [place]: [id number] Representative spoke with at [place]: [place] Determinants of Health ([place]) Interventions    Readmission Risk Interventions     No data to display

## 2022-06-27 NOTE — Plan of Care (Signed)

## 2022-06-27 NOTE — Discharge Summary (Signed)
Physician Discharge Summary   Patient: Sabrina [name] MRN: [id number] DOB: 12/04/1940  Admit date:     06/25/2022  Discharge date: 06/27/22  Discharge Physician: Vernelle Emerald   PCP: Ginger Organ., MD   Recommendations at discharge:   Please advance diet as tolerated. Take a daily mild laxative such as over-the-counter senna 2 tabs every evening. If this is not enough, consider taking an additional mild laxative such as a capful of MiraLAX mixed with juice or drink of your choice once daily.  This is also over-the-counter. If you develop worsening abdominal pain unrelenting constipation, nausea, vomiting or fever please return to the emergency department immediately. Please follow-up with your primary care provider in 1 to 2 weeks Please discuss the possibility with your primary care provider of working up the inflammation of your abdomen with a referral to interventional radiology.    Discharge Diagnoses: Principal Problem:   Abdominal pain Active Problems:   Spigelian hernia   Chronic diastolic CHF (congestive heart failure) (HCC)   Essential hypertension   Hypothyroidism   Myasthenia gravis, 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HCC)  Resolved Problems:   * No resolved hospital problems. *   Hospital Course: 81 year old female with past medical history of longstanding abdominal wall hernia occurring after a cholecystectomy, myasthenia gravis (follows at [facility], on CellCe[facility]), prior TIA, asthma, hypertension, hypothyroidism, hyperlipidemia who presented to [facility] emergency department with complaints of abdominal pain.  Patient was admitted to the hospital service on 10/22.  Dr. Saverio Danker with general surgery was consulted for evaluation of patient's painful spigelian and Richter's hernias.  Patient was felt to not have a bowel obstruction and patient voiced her desire to not pursue surgical intervention regardless.  Patient was also noted to have some degree of  panniculitis versus carcinomatosis on imaging.  Dramatically improved with conservative measures during the hospitalization making a malignant process extremely unlikely.  I had a lengthy discussion with the patient concerning this finding during this hospitalization.  She stated that she did not wish to pursue an invasive work-up at this time.  She wished to monitor her symptoms in the outpatient setting and would then follow-up with her outpatient provider  and determine if further outpatient work-up for potential malignancy is necessary.  If patient develops recurrence of symptoms and work-up is pursued, and ambulatory referral to interventional radiology would be the best approach for biopsy.  Patient has tolerated advancement of her diet well.  Arrangements are being made for the patient to be discharged home in improved and stable condition on 06/27/2022.      Pain control - [employer] Controlled Substance Reporting System database was reviewed. and patient was instructed, not to drive, operate heavy machinery, perform activities at heights, swimming or participation in water activities or provide baby-sitting services while on Pain, Sleep and Anxiety Medications; until their outpatient Physician has advised to do so again. Also recommended to not to take more than prescribed Pain, Sleep and Anxiety Medications.   Consultants: Dr. Maxwell Caul with general surgery Procedures performed: None  Disposition: Home Diet recommendation:  Discharge Diet Orders (From admission, onward)     Start     Ordered   06/27/22 0000  Diet - low sodium heart healthy        06/27/22 1330           Cardiac diet  DISCHARGE MEDICATION: Allergies as of 06/27/2022       Reactions   Azathioprine Anaphylaxis, Other (See Comments)  Kidneys shut down   Other Anaphylaxis   Sodium pentathol Uncoded Allergy. Allergen: Sodium pentathol   Aminoglycosides    Tobramycin, gentamycin, kanamycin, neomycin,  streptomycin Can't take because of myasthenia gravis   Avelox [moxifloxacin Hcl In Nacl] Other (See Comments)   Can't take because of myasthenia gravis   Beta Adrenergic Blockers    Proponolol, timolol maleate eyedrops Can't take because of myasthenia gravis   Calcium Channel Blockers    Blood pressure medications Can't take because of myasthenia gravis   Cephalosporins    Can't take because of myasthenia gravis   Ciprofloxacin Other (See Comments)   Can't take because of myasthenia gravis Can't take because of myasthenia gravis   Clindamycin/lincomycin    May exascerbate myasthenia gravis   Colistin    Can't take because of myasthenia gravis   Fenofibrate    Other reaction(s): GI upset, myalgia   Fosamax [alendronate Sodium]    LEG WEAKNESS   Imuran [azathioprine Sodium] Other (See Comments)   Kidneys shut down   Iodinated Contrast Media Other (See Comments)   Contraindicated to Myasthenia Gravis   Iodine    Iodine contrast Can't take because of myasthenia gravis   Macrolides And Ketolides    Erythromocin, azithromycin, telithromycin Can't take because of myasthenia gravis   Magnesium-containing Compounds    Including milk of magnesia, antacids containing magnesium hydroxide (maalox, mylanta) and epsom salts Can't take because of myasthenia gravis   Moxifloxacin Other (See Comments)   Can't take because of myasthenia gravis   Niacin And Related    Other reaction(s): leg aches   Norfloxacin Other (See Comments)   Can't take because of myasthenia gravis Can't take because of myasthenia gravis   Ofloxacin Other (See Comments)   Can't take because of myasthenia gravis Can't take because of myasthenia gravis   Pefloxacin Other (See Comments)   Can't take because of myasthenia gravis Can't take because of myasthenia gravis   Penicillamine    do not use due to Myasthenia Gravis   Prednisolone Nausea And Vomiting   Prednisone Nausea And Vomiting   Procainamide    Can't  take because of myasthenia gravis   Quinidine    Can't take because of myasthenia gravis   Quinine Derivatives    Can't take because of myasthenia gravis   Succinylcholine    Can't take because of myasthenia gravis No paralytics   Tubocurarine    Can't take because of myasthenia gravis   Vecuronium    Can't take because of myasthenia gravis No paralytics   Vicodin [hydrocodone-acetaminophen] Other (See Comments)   Pt suffers from bad headaches        Medication List     TAKE these medications    ALPRAZolam 0.5 MG tablet Commonly known as: XANAX Take 0.5 mg by mouth 4 (four) times daily as needed.   aspirin EC 81 MG tablet Take 1 tablet (81 mg total) by mouth daily. Swallow whole.   atorvastatin 20 MG tablet Commonly known as: LIPITOR Take 1 tablet (20 mg total) by mouth daily.   Centrum Silver tablet Take 1 tablet by mouth daily.   diclofenac Sodium 1 % Gel Commonly known as: VOLTAREN Apply 2 g topically every 6 (six) hours as needed (pain).   enoxaparin 40 MG/0.4ML injection Commonly known as: LOVENOX Inject 0.4 mLs (40 mg total) into the skin daily.   FLUoxetine 20 MG capsule Commonly known as: PROZAC Take 20 mg by mouth daily.   fluticasone 50 MCG/ACT nasal spray  Commonly known as: FLONASE Place 2 sprays into both nostrils daily.   levothyroxine 75 MCG tablet Commonly known as: SYNTHROID Take 75 mcg by mouth every morning. MUST TAKE BRAND NAME SYNTHROID   montelukast 10 MG tablet Commonly known as: SINGULAIR Take 10 mg by mouth at bedtime. MUST BE BRAND NAME ONLY-SINGULAIR   mycophenolate 500 MG tablet Commonly known as: CELLCEPT Take 1,000 mg by mouth 2 (two) times daily.   naproxen 500 MG tablet Commonly known as: NAPROSYN Take 500 mg by mouth 2 (two) times daily.   Omega 3 1200 MG Caps Take 1,200 mg by mouth daily.   oxyCODONE-acetaminophen 5-325 MG tablet Commonly known as: PERCOCET/ROXICET Take 1 tablet by mouth every 8 (eight) hours  as needed for severe pain.   polyethylene glycol powder 17 GM/SCOOP powder Commonly known as: MiraLax Take 255 g by mouth daily as needed (constipation refractory to daily senna use).   senna 8.6 MG Tabs tablet Commonly known as: SENOKOT Take 2 tablets (17.2 mg total) by mouth at bedtime. Do note take if you are having loose stools or diarrhea   simethicone 80 MG chewable tablet Commonly known as: MYLICON Chew 2 tablets (160 mg total) by mouth 4 (four) times daily as needed for flatulence.   traZODone 150 MG tablet Commonly known as: DESYREL Take 75 mg by mouth at bedtime.   Trelegy Ellipta 200-62.5-25 MCG/ACT Aepb Generic drug: Fluticasone-Umeclidin-Vilant Inhale 1 puff into the lungs daily.   valsartan 160 MG tablet Commonly known as: Diovan Take 1 tablet (160 mg total) by mouth daily.   vitamin C 1000 MG tablet Take 1,000 mg by mouth every morning.   Vitamin D-3 25 MCG (1000 UT) Caps Take 1 capsule (1,000 Units total) by mouth every morning. What changed:  medication strength how much to take         Discharge Exam: Filed Weights   06/26/22 2009  Weight: 82.6 kg    Constitutional: Awake alert and oriented x3, no associated distress.   Respiratory: clear to auscultation bilaterally, no wheezing, no crackles. Normal respiratory effort. No accessory muscle use.  Cardiovascular: Regular rate and rhythm, no murmurs / rubs / gallops. No extremity edema. 2+ pedal pulses. No carotid bruits.  Abdomen: Firm masslike finding in the right side of the abdomen that is minimally tender.   Positive bowel sounds noted in all quadrants.   Musculoskeletal: No joint deformity upper and lower extremities. Good ROM, no contractures. Normal muscle tone.     Condition at discharge: fair  The results of significant diagnostics from this hospitalization (including imaging, microbiology, ancillary and laboratory) are listed below for reference.   Imaging Studies: CT ABDOMEN PELVIS WO  CONTRAST  Result Date: 06/25/2022 CLINICAL DATA:  LLQ abdominal pain abdominal pain, hernias EXAM: CT ABDOMEN AND PELVIS WITHOUT CONTRAST TECHNIQUE: Multidetector CT imaging of the abdomen and pelvis was performed following the standard protocol without IV contrast. RADIATION DOSE REDUCTION: This exam was performed according to the departmental dose-optimization program which includes automated exposure control, adjustment of the mA and/or kV according to patient size and/or use of iterative reconstruction technique. COMPARISON:  CT abdomen pelvis 04/21/2022 FINDINGS: Lower chest: Bilateral lower lobe mild bronchiectasis and architectural distortion likely due to prior prior infection. Hepatobiliary: No focal liver abnormality. Status post cholecystectomy. No biliary dilatation. Pancreas: Diffusely atrophic. No focal lesion. Otherwise normal pancreatic contour. No surrounding inflammatory changes. No main pancreatic ductal dilatation. Spleen: Normal in size without focal abnormality. Adrenals/Urinary Tract: No adrenal nodule bilaterally.  Bilateral renal cortical scarring. No nephrolithiasis and no hydronephrosis. No definite contour-deforming renal mass. No ureterolithiasis or hydroureter. The urinary bladder is unremarkable. Stomach/Bowel: Stomach is within normal limits. No evidence of bowel wall thickening or dilatation. The appendix is not definitely identified with no inflammatory changes in the right lower quadrant to suggest acute appendicitis. Vascular/Lymphatic: No abdominal aorta or iliac aneurysm. Mild atherosclerotic plaque of the aorta and its branches. No abdominal, pelvic, or inguinal lymphadenopathy. Reproductive: Status post hysterectomy. No adnexal masses. Other: Redemonstration of superior anterior abdominal omental/peritoneal fat stranding and nodularity which extends into a moderate volume right spigelian hernia as well as a small supraumbilical Richter ventral wall hernia. The spigelian  hernia as an abdominal defect of approximately 3.3 cm (2:38). The Richter ventral hernia demonstrates an abdominal defect of approximately 3.8 cm in contains a short segment of anti mesenteric transverse colon wall (2:23). Small volume simple free fluid ascites. No free intraperitoneal gas. No abscess formation. Musculoskeletal: No abdominal wall hernia or abnormality. No suspicious lytic or blastic osseous lesions. No acute displaced fracture. Grade 1 anterolisthesis of L4 on L5 status post bilateral posterolateral and interbody fusion surgical hardware. Multilevel degenerative changes of the spine. Partially visualized total right hip arthroplasty. Severe degenerative changes of the left hip. IMPRESSION: 1. Redemonstration of superior anterior abdominal peritoneal/omental fat stranding and nodularity which extends into a moderate volume right spigelian hernia as well as a small supraumbilical Richter ventral wall hernia. Associated small volume free fluid within both hernias. Similar chronic appearance of the associated fat and intraperitoneal fat which may be due to fat necrosis/ischemia; however, underlying peritoneal carcinomatosis or infection cannot be fully excluded. Limited evaluation on this noncontrast study. 2. No associated bowel obstruction with the Richter hernia. 3. Small volume simple free fluid ascites. 4.  Aortic Atherosclerosis (ICD10-I70.0). Electronically Signed   By: Iven Finn M.D.   On: 06/25/2022 18:05    Microbiology: Results for orders placed or performed during the hospital encounter of 04/09/21  Resp Panel by RT-PCR (Flu A&B, Covid) Nasopharyngeal Swab     Status: None   Collection Time: 04/09/21  7:36 PM   Specimen: Nasopharyngeal Swab; Nasopharyngeal(NP) swabs in vial transport medium  Result Value Ref Range Status   SARS Coronavirus 2 by RT PCR NEGATIVE NEGATIVE Final    Comment: (NOTE) SARS-CoV-2 target nucleic acids are NOT DETECTED.  The SARS-CoV-2 RNA is  generally detectable in upper respiratory specimens during the acute phase of infection. The lowest concentration of SARS-CoV-2 viral copies this assay can detect is 138 copies/mL. A negative result does not preclude SARS-Cov-2 infection and should not be used as the sole basis for treatment or other patient management decisions. A negative result may occur with  improper specimen collection/handling, submission of specimen other than nasopharyngeal swab, presence of viral mutation(s) within the areas targeted by this assay, and inadequate number of viral copies(<138 copies/mL). A negative result must be combined with clinical observations, patient history, and epidemiological information. The expected result is Negative.  Fact Sheet for Patients:  EntrepreneurPulse.com.au  Fact Sheet for Healthcare Providers:  IncredibleEmployment.be  This test is no t yet approved or cleared by the [place] FDA and  has been authorized for detection and/or diagnosis of SARS-CoV-2 by FDA under an Emergency Use Authorization (EUA). This EUA will remain  in effect (meaning this test can be used) for the duration of the COVID-19 declaration under Section 564(b)(1) of the Act, 21 U.S.C.section 360bbb-3(b)(1), unless the authorization is terminated  or  revoked sooner.       Influenza A by PCR NEGATIVE NEGATIVE Final   Influenza B by PCR NEGATIVE NEGATIVE Final    Comment: (NOTE) The Xpert Xpress SARS-CoV-2/FLU/RSV plus assay is intended as an aid in the diagnosis of influenza from Nasopharyngeal swab specimens and should not be used as a sole basis for treatment. Nasal washings and aspirates are unacceptable for Xpert Xpress SARS-CoV-2/FLU/RSV testing.  Fact Sheet for Patients: EntrepreneurPulse.com.au  Fact Sheet for Healthcare Providers: IncredibleEmployment.be  This test is not yet approved or cleared by the [place] FDA and has been authorized for detection and/or diagnosis of SARS-CoV-2 by FDA under an Emergency Use Authorization (EUA). This EUA will remain in effect (meaning this test can be used) for the duration of the COVID-19 declaration under Section 564(b)(1) of the Act, 21 U.S.C. section 360bbb-3(b)(1), unless the authorization is terminated or revoked.  Performed at [facility], [place] [address] [place], [place] [address]   MRSA Next Gen by PCR, Nasal     Status: None   Collection Time: 04/10/21  5:07 PM   Specimen: Nasal Mucosa; Nasal Swab  Result Value Ref Range Status   MRSA by PCR Next Gen NOT DETECTED NOT DETECTED Final    Comment: (NOTE) The GeneXpert MRSA Assay (FDA approved for NASAL specimens only), is one component of a comprehensive MRSA colonization surveillance program. It is not intended to diagnose MRSA infection nor to guide or monitor treatment for MRSA infections. Test performance is not FDA approved in patients less than 27 years old. Performed at [facility], [place] [address] [place], [place] [address]     Labs: CBC: Recent Labs  Lab 06/25/22 1646 06/26/22 0118 06/27/22 0405  WBC 7.2 6.5 10.7*  NEUTROABS 5.6 4.6  --   HGB 13.0 11.8* 12.7  HCT 40.2 36.6 39.4  MCV 87.2 87.4 86.8  PLT 289 297 333   Basic Metabolic Panel: Recent Labs  Lab 06/25/22 1646 06/26/22 0118 06/26/22 2259 06/27/22 0405  NA 137 137 136 135  K 2.8* 2.9* 4.3 3.9  CL 103 103 104 104  CO2 '27 26 24 24  '$ GLUCOSE 122* 123* 128* 131*  BUN 6* 6* <5* 5*  CREATININE 0.57 0.52 0.56 0.46  CALCIUM 9.0 8.8* 9.2 9.0  MG  --  2.0  --  1.8  PHOS  --  2.8  --   --    Liver Function Tests: Recent Labs  Lab 06/25/22 1646 06/26/22 0118  AST 24 21  ALT 16 14  ALKPHOS 89 79  BILITOT 1.1 0.9  PROT 5.9* 5.5*  ALBUMIN 3.3* 3.0*   CBG: No results for input(s): "GLUCAP" in the last 168 hours.  Discharge time spent: greater than 30 minutes.  Signed: Vernelle Emerald, MD [facility] 06/27/2022

## 2022-06-27 NOTE — Progress Notes (Signed)
Nurse notified on call provider about patient's elevated BP throughout the night. The first occurrence was when the patient's BP was 186/62 after rechecking it from the BP being 181/64, since patient had been moving from getting cleaned up once admitted to the floor. On call provider had told nurse to wait and let the patient settle down some more. The second occurrence was when the patient's BP was 185/68. On call provider put in an order for irbesartan (Avapro) 150 mg tablet by mouth as a one time dose. The third occurrence was when the patient's BP was 174/54 after rechecking it from the BP being 185/79. No further orders from on call provider at this time. Will pass along to dayshift.

## 2022-06-27 NOTE — Plan of Care (Signed)

## 2022-06-27 NOTE — TOC Initial Note (Signed)
Transition of Care [facility]) - Initial/Assessment Note    Patient Details  Name: Sabrina [name] MRN: [id number] Date of Birth: [birth year]  [facility] CM/SW Contact:    Roseanne Kaufman, RN Phone Number: 06/27/2022, 2:46 PM  Clinical Narrative:    [place] consult for SDOH: food insecurities.             TOC will follow   Expected Discharge Plan: [place] Barriers to Discharge: No Barriers Identified   Patient Goals and CMS Choice Patient states their goals for this hospitalization and ongoing recovery are:: Return home with home health physical therapy CMS Medicare.gov Compare Post Acute Care list provided to:: Patient Choice offered to / list presented to : Patient  Expected Discharge Plan and Services Expected Discharge Plan: [place] In-house Referral: NA Discharge Planning Services: CM Consult Post Acute Care Choice: [place] arrangements for the past 2 months: Apartment Expected Discharge Date: 06/27/22               DME Arranged: N/A DME Agency: NA       HH Arranged: PT HH Agency: [place] Date [place]: 06/27/22 Time [place]: 77 Representative spoke with at [place]: [place] Arrangements/Services Living arrangements for the past 2 months: Apartment Lives with:: Adult Children Patient language and need for interpreter reviewed:: Yes Do you feel safe going back to the place where you live?: Yes      Need for Family Participation in Patient Care: No (Comment) Care giver support system in place?: Yes (comment) Current home services: DME, Home PT (DME: walker and rollator walker) Criminal Activity/Legal Involvement Pertinent to Current Situation/Hospitalization: No - Comment as needed  Activities of Daily Living Home Assistive Devices/Equipment: Environmental consultant (specify type), Eyeglasses (reading glasses, rolator) ADL Screening (condition at time of  admission) Patient's cognitive ability adequate to safely complete daily activities?: Yes Is the patient 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) Does the patient have difficulty seeing, even when wearing glasses/contacts?: No Does the patient have difficulty concentrating, remembering, or making decisions?: No Patient able to express need for assistance with ADLs?: Yes Does the patient have difficulty dressing or bathing?: No Independently performs ADLs?: Yes (appropriate for developmental age) (walks with a walker) Does the patient have difficulty walking or climbing stairs?: Yes Weakness of Legs: None Weakness of Arms/Hands: None  Permission Sought/Granted Permission sought to share information with : Case Manager Permission granted to share information with : Yes, Verbal Permission Granted  Share Information with NAME: Case manager           Emotional Assessment Appearance:: Appears stated age Attitude/Demeanor/Rapport: Gracious Affect (typically observed): Accepting Orientation: : Oriented to Self, Oriented to Place, Oriented to  Time Alcohol / Substance Use: Not Applicable Psych Involvement: No (comment)  Admission diagnosis:  Incisional hernia, without obstruction or gangrene [K43.2] Abdominal pain [R10.9] HFrEF (heart failure with reduced ejection fraction) (HCC) [I50.20] Patient Active Problem List   Diagnosis Date Noted   Spigelian hernia 06/27/2022   Chronic diastolic CHF (congestive heart failure) ([place]) 06/27/2022   Hypothyroidism 06/27/2022   Abdominal pain 06/25/2022   Transient ischemic attack 04/12/2021   Acute ischemic stroke ([place]) 04/09/2021   Fracture, proximal femur, right, closed, initial encounter ([place])    Hyponatremia 03/24/2021   Hypokalemia 03/24/2021   Anxiety 03/24/2021   Hip fracture ([place]) 03/23/2021   Unilateral primary osteoarthritis, left knee  11/26/2017   Lumbar stenosis 06/18/2015   Essential hypertension 02/20/2014    Bradycardia 02/20/2014   Swelling of left lower extremity 02/20/2014   Rotator cuff arthropathy 10/16/2012   Carpal tunnel syndrome 10/16/2012   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

## 2022-06-27 NOTE — Discharge Instructions (Signed)
Please advance diet as tolerated. Take a daily mild laxative such as over-the-counter senna 2 tabs every evening. If this is not enough, consider taking an additional mild laxative such as a capful of MiraLAX mixed with juice or drink of your choice once daily.  This is also over-the-counter. If you develop worsening abdominal pain unrelenting constipation, nausea, vomiting or fever please return to the emergency department immediately. Please follow-up with your primary care provider in 1 to 2 weeks Please discuss the possibility with your primary care provider of working up the inflammation of your abdomen with a referral to interventional radiology.

## 2022-06-27 NOTE — Hospital Course (Addendum)
81 year old female with past medical history of longstanding abdominal wall hernia occurring after a cholecystectomy, myasthenia gravis (follows at [facility], on [employer]), prior TIA, asthma, hypertension, hypothyroidism, hyperlipidemia who presented to [facility] emergency department with complaints of abdominal pain.  Patient was admitted to the hospital service on 10/22.  Dr. Saverio Danker with general surgery was consulted for evaluation of patient's painful spigelian and Richter's hernias.  Patient was felt to not have a bowel obstruction and patient voiced her desire to not pursue surgical intervention regardless.  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  Dramatically improved with conservative measures during the hospitalization making a malignant process extremely unlikely.  I had a lengthy discussion with the patient concerning this finding during this hospitalization.  She stated that she did not wish to pursue an invasive work-up at this time.  She wished to monitor her symptoms in the outpatient setting and would then follow-up with her outpatient provider  and determine if further outpatient work-up for potential malignancy is necessary.  If patient develops recurrence of symptoms and work-up is pursued, and ambulatory referral to interventional radiology would be the best approach for biopsy.  Patient has tolerated advancement of her diet well.  Arrangements are being made for the patient to be discharged home in improved and stable condition on 06/27/2022.

## 2022-06-30 DIAGNOSIS — G7000 Myasthenia gravis without (acute) exacerbation: Secondary | ICD-10-CM | POA: Diagnosis not present

## 2022-06-30 DIAGNOSIS — R2681 Unsteadiness on feet: Secondary | ICD-10-CM | POA: Diagnosis not present

## 2022-06-30 DIAGNOSIS — M810 Age-related osteoporosis without current pathological fracture: Secondary | ICD-10-CM | POA: Diagnosis not present

## 2022-06-30 DIAGNOSIS — R296 Repeated falls: Secondary | ICD-10-CM | POA: Diagnosis not present

## 2022-06-30 DIAGNOSIS — M545 Low back pain, unspecified: Secondary | ICD-10-CM | POA: Diagnosis not present

## 2022-06-30 DIAGNOSIS — I10 Essential (primary) hypertension: Secondary | ICD-10-CM | POA: Diagnosis not present

## 2022-07-04 DIAGNOSIS — R1084 Generalized abdominal pain: Secondary | ICD-10-CM | POA: Diagnosis not present

## 2022-07-04 DIAGNOSIS — E876 Hypokalemia: Secondary | ICD-10-CM | POA: Diagnosis not present

## 2022-07-04 DIAGNOSIS — K439 Ventral hernia without obstruction or gangrene: Secondary | ICD-10-CM | POA: Diagnosis not present

## 2022-07-07 DIAGNOSIS — R188 Other ascites: Secondary | ICD-10-CM | POA: Diagnosis not present

## 2022-07-07 DIAGNOSIS — K430 Incisional hernia with obstruction, without gangrene: Secondary | ICD-10-CM | POA: Diagnosis not present

## 2022-07-12 ENCOUNTER — Other Ambulatory Visit (HOSPITAL_COMMUNITY): Payer: Self-pay | Admitting: Internal Medicine

## 2022-07-12 DIAGNOSIS — R1084 Generalized abdominal pain: Secondary | ICD-10-CM

## 2022-07-12 DIAGNOSIS — M810 Age-related osteoporosis without current pathological fracture: Secondary | ICD-10-CM | POA: Diagnosis not present

## 2022-07-12 DIAGNOSIS — G7000 Myasthenia gravis without (acute) exacerbation: Secondary | ICD-10-CM | POA: Diagnosis not present

## 2022-07-12 DIAGNOSIS — R296 Repeated falls: Secondary | ICD-10-CM | POA: Diagnosis not present

## 2022-07-12 DIAGNOSIS — I10 Essential (primary) hypertension: Secondary | ICD-10-CM | POA: Diagnosis not present

## 2022-07-12 DIAGNOSIS — M545 Low back pain, unspecified: Secondary | ICD-10-CM | POA: Diagnosis not present

## 2022-07-12 DIAGNOSIS — R2681 Unsteadiness on feet: Secondary | ICD-10-CM | POA: Diagnosis not present

## 2022-07-12 NOTE — Progress Notes (Signed)
Sabrina [name], Sabrina Glaser, MD  Riley Lam; P Ir Procedure Requests BIOPSY REVIEW Date: 07/12/22  Requested Biopsy site: None listed Reason for request: Pain  Imaging review: I reviewed all pertinent diagnostic studies, including; CT AP, 10/22 and 04/21/22 Recommended imaging modality to perform biopsy: in CT, with Ultrasound for Bx (+US Para)  Additional comments: Performing Doc, large Pt, suspect poor [place] penetrance. R lateral abd wall hernia contains  omentum with caking. Midline hernia contained bowel on 04/21/22 CT. Consider Bx + DX/RX [place] Para.  Please contact me with questions, concerns, or if issue pertaining to this request arise.  Michaelle Birks, MD [facility] Radiology  Pager. [phone]

## 2022-07-13 ENCOUNTER — Encounter (HOSPITAL_COMMUNITY): Payer: Self-pay

## 2022-07-13 ENCOUNTER — Other Ambulatory Visit: Payer: Self-pay

## 2022-07-13 ENCOUNTER — Emergency Department (HOSPITAL_COMMUNITY): Payer: Medicare Other

## 2022-07-13 ENCOUNTER — Observation Stay (HOSPITAL_COMMUNITY)
Admission: EM | Admit: 2022-07-13 | Discharge: 2022-07-14 | Disposition: A | Discharge status: Home / Self Care | Payer: Medicare Other | Attending: Family Medicine | Admitting: Family Medicine

## 2022-07-13 DIAGNOSIS — I5032 Chronic diastolic (congestive) heart failure: Secondary | ICD-10-CM | POA: Diagnosis not present

## 2022-07-13 DIAGNOSIS — N200 Calculus of kidney: Secondary | ICD-10-CM | POA: Diagnosis not present

## 2022-07-13 DIAGNOSIS — G7000 Myasthenia gravis without (acute) exacerbation: Secondary | ICD-10-CM | POA: Diagnosis present

## 2022-07-13 DIAGNOSIS — Z96641 Presence of right artificial hip joint: Secondary | ICD-10-CM | POA: Insufficient documentation

## 2022-07-13 DIAGNOSIS — I10 Essential (primary) hypertension: Secondary | ICD-10-CM | POA: Diagnosis present

## 2022-07-13 DIAGNOSIS — E039 Hypothyroidism, unspecified: Secondary | ICD-10-CM | POA: Diagnosis present

## 2022-07-13 DIAGNOSIS — Z96612 Presence of left artificial shoulder joint: Secondary | ICD-10-CM | POA: Diagnosis not present

## 2022-07-13 DIAGNOSIS — K469 Unspecified abdominal hernia without obstruction or gangrene: Secondary | ICD-10-CM | POA: Insufficient documentation

## 2022-07-13 DIAGNOSIS — Z79899 Other long term (current) drug therapy: Secondary | ICD-10-CM | POA: Insufficient documentation

## 2022-07-13 DIAGNOSIS — R627 Adult failure to thrive: Secondary | ICD-10-CM

## 2022-07-13 DIAGNOSIS — R001 Bradycardia, unspecified: Secondary | ICD-10-CM | POA: Diagnosis not present

## 2022-07-13 DIAGNOSIS — I110 Hypertensive heart disease with heart failure: Secondary | ICD-10-CM | POA: Insufficient documentation

## 2022-07-13 DIAGNOSIS — Z8673 Personal history of transient ischemic attack (TIA), and cerebral infarction without residual deficits: Secondary | ICD-10-CM | POA: Diagnosis not present

## 2022-07-13 DIAGNOSIS — R188 Other ascites: Secondary | ICD-10-CM | POA: Diagnosis not present

## 2022-07-13 DIAGNOSIS — J45909 Unspecified asthma, uncomplicated: Secondary | ICD-10-CM | POA: Insufficient documentation

## 2022-07-13 DIAGNOSIS — R140 Abdominal distension (gaseous): Secondary | ICD-10-CM | POA: Diagnosis not present

## 2022-07-13 DIAGNOSIS — R1909 Other intra-abdominal and pelvic swelling, mass and lump: Secondary | ICD-10-CM

## 2022-07-13 DIAGNOSIS — F419 Anxiety disorder, unspecified: Secondary | ICD-10-CM | POA: Diagnosis not present

## 2022-07-13 DIAGNOSIS — Z85828 Personal history of other malignant neoplasm of skin: Secondary | ICD-10-CM | POA: Diagnosis not present

## 2022-07-13 DIAGNOSIS — D72829 Elevated white blood cell count, unspecified: Secondary | ICD-10-CM | POA: Insufficient documentation

## 2022-07-13 DIAGNOSIS — T68XXXA Hypothermia, initial encounter: Secondary | ICD-10-CM | POA: Diagnosis not present

## 2022-07-13 DIAGNOSIS — R1084 Generalized abdominal pain: Secondary | ICD-10-CM | POA: Diagnosis present

## 2022-07-13 DIAGNOSIS — C801 Malignant (primary) neoplasm, unspecified: Secondary | ICD-10-CM | POA: Diagnosis not present

## 2022-07-13 DIAGNOSIS — R1900 Intra-abdominal and pelvic swelling, mass and lump, unspecified site: Principal | ICD-10-CM | POA: Diagnosis present

## 2022-07-13 DIAGNOSIS — R0689 Other abnormalities of breathing: Secondary | ICD-10-CM | POA: Diagnosis not present

## 2022-07-13 DIAGNOSIS — Z7982 Long term (current) use of aspirin: Secondary | ICD-10-CM | POA: Insufficient documentation

## 2022-07-13 DIAGNOSIS — I959 Hypotension, unspecified: Secondary | ICD-10-CM | POA: Diagnosis not present

## 2022-07-13 HISTORY — PX: IR PARACENTESIS: IMG2679

## 2022-07-13 LAB — CBC WITH DIFFERENTIAL/PLATELET
Abs Immature Granulocytes: 0.1 10*3/uL — ABNORMAL HIGH (ref 0.00–0.07)
Basophils Absolute: 0 10*3/uL (ref 0.0–0.1)
Basophils Relative: 0 %
Eosinophils Absolute: 0 10*3/uL (ref 0.0–0.5)
Eosinophils Relative: 0 %
HCT: 41.7 % (ref 36.0–46.0)
Hemoglobin: 13.3 g/dL (ref 12.0–15.0)
Immature Granulocytes: 1 %
Lymphocytes Relative: 4 %
Lymphs Abs: 0.6 10*3/uL — ABNORMAL LOW (ref 0.7–4.0)
MCH: 28 pg (ref 26.0–34.0)
MCHC: 31.9 g/dL (ref 30.0–36.0)
MCV: 87.8 fL (ref 80.0–100.0)
Monocytes Absolute: 1 10*3/uL (ref 0.1–1.0)
Monocytes Relative: 6 %
Neutro Abs: 14.7 10*3/uL — ABNORMAL HIGH (ref 1.7–7.7)
Neutrophils Relative %: 89 %
Platelets: 431 10*3/uL — ABNORMAL HIGH (ref 150–400)
RBC: 4.75 MIL/uL (ref 3.87–5.11)
RDW: 13.3 % (ref 11.5–15.5)
WBC: 16.4 10*3/uL — ABNORMAL HIGH (ref 4.0–10.5)
nRBC: 0 % (ref 0.0–0.2)

## 2022-07-13 LAB — COMPREHENSIVE METABOLIC PANEL
ALT: 14 U/L (ref 0–44)
AST: 22 U/L (ref 15–41)
Albumin: 3.1 g/dL — ABNORMAL LOW (ref 3.5–5.0)
Alkaline Phosphatase: 93 U/L (ref 38–126)
Anion gap: 11 (ref 5–15)
BUN: 5 mg/dL — ABNORMAL LOW (ref 8–23)
CO2: 24 mmol/L (ref 22–32)
Calcium: 8.9 mg/dL (ref 8.9–10.3)
Chloride: 100 mmol/L (ref 98–111)
Creatinine, Ser: 0.8 mg/dL (ref 0.44–1.00)
GFR, Estimated: >60 mL/min (ref >60)
Glucose, Bld: 129 mg/dL — ABNORMAL HIGH (ref 70–99)
Potassium: 3.8 mmol/L (ref 3.5–5.1)
Sodium: 135 mmol/L (ref 135–145)
Total Bilirubin: 1 mg/dL (ref 0.3–1.2)
Total Protein: 5.5 g/dL — ABNORMAL LOW (ref 6.5–8.1)

## 2022-07-13 LAB — GLUCOSE, PLEURAL OR PERITONEAL FLUID: Glucose, Fluid: 113 mg/dL

## 2022-07-13 LAB — PROTEIN, PLEURAL OR PERITONEAL FLUID: Total protein, fluid: <3 g/dL

## 2022-07-13 LAB — LACTATE DEHYDROGENASE, PLEURAL OR PERITONEAL FLUID: LD, Fluid: 147 U/L — ABNORMAL HIGH (ref 3–23)

## 2022-07-13 LAB — BODY FLUID CELL COUNT WITH DIFFERENTIAL
Eos, Fluid: 6 %
Lymphs, Fluid: 4 %
Monocyte-Macrophage-Serous Fluid: 58 % (ref 50–90)
Neutrophil Count, Fluid: 32 % — ABNORMAL HIGH (ref 0–25)
Total Nucleated Cell Count, Fluid: 305 cu mm (ref 0–1000)

## 2022-07-13 LAB — GRAM STAIN

## 2022-07-13 LAB — PROTIME-INR
INR: 1.2 (ref 0.8–1.2)
Prothrombin Time: 15 seconds (ref 11.4–15.2)

## 2022-07-13 LAB — ALBUMIN, PLEURAL OR PERITONEAL FLUID: Albumin, Fluid: 1.9 g/dL

## 2022-07-13 LAB — LIPASE, BLOOD: Lipase: 41 U/L (ref 11–51)

## 2022-07-13 LAB — AMMONIA: Ammonia: 18 umol/L (ref 9–35)

## 2022-07-13 MED ORDER — ALPRAZOLAM 0.5 MG PO TABS
0.5000 mg | ORAL_TABLET | Freq: Four times a day (QID) | ORAL | Status: DC | PRN
  Administered 2022-07-13 – 2022-07-14 (×2): 0.5 mg via ORAL
  Filled 2022-07-13 (×2): qty 1

## 2022-07-13 MED ORDER — MONTELUKAST SODIUM 10 MG PO TABS
10.0000 mg | ORAL_TABLET | Freq: Every day | ORAL | Status: DC
  Administered 2022-07-13: 10 mg via ORAL
  Filled 2022-07-13: qty 1

## 2022-07-13 MED ORDER — ATORVASTATIN CALCIUM 10 MG PO TABS
20.0000 mg | ORAL_TABLET | Freq: Every day | ORAL | Status: DC
  Administered 2022-07-14: 20 mg via ORAL
  Filled 2022-07-13: qty 2

## 2022-07-13 MED ORDER — FENTANYL CITRATE PF 50 MCG/ML IJ SOSY
25.0000 ug | PREFILLED_SYRINGE | Freq: Once | INTRAMUSCULAR | Status: AC
  Administered 2022-07-13: 25 ug via INTRAVENOUS
  Filled 2022-07-13: qty 1

## 2022-07-13 MED ORDER — LEVOTHYROXINE SODIUM 75 MCG PO TABS
75.0000 ug | ORAL_TABLET | Freq: Every morning | ORAL | Status: DC
  Administered 2022-07-14: 75 ug via ORAL
  Filled 2022-07-13: qty 1

## 2022-07-13 MED ORDER — FLUTICASONE FUROATE-VILANTEROL 200-25 MCG/ACT IN AEPB
1.0000 | INHALATION_SPRAY | Freq: Every day | RESPIRATORY_TRACT | Status: DC
  Filled 2022-07-13: qty 28

## 2022-07-13 MED ORDER — SODIUM CHLORIDE 0.9% FLUSH
3.0000 mL | Freq: Two times a day (BID) | INTRAVENOUS | Status: DC
  Administered 2022-07-13 – 2022-07-14 (×2): 3 mL via INTRAVENOUS

## 2022-07-13 MED ORDER — LIDOCAINE HCL 1 % IJ SOLN
20.0000 mL | Freq: Once | INTRAMUSCULAR | Status: AC
  Administered 2022-07-13: 20 mL via INTRADERMAL
  Filled 2022-07-13: qty 20

## 2022-07-13 MED ORDER — FLUTICASONE FUROATE-VILANTEROL 200-25 MCG/ACT IN AEPB: 1.0000 | INHALATION_SPRAY | Freq: Every day | RESPIRATORY_TRACT | Status: DC

## 2022-07-13 MED ORDER — ACETAMINOPHEN 650 MG RE SUPP: 650.0000 mg | Freq: Four times a day (QID) | RECTAL | Status: DC | PRN

## 2022-07-13 MED ORDER — LIDOCAINE HCL 1 % IJ SOLN
INTRAMUSCULAR | Status: AC
  Administered 2022-07-13: 15 mL via INTRADERMAL
  Filled 2022-07-13: qty 20

## 2022-07-13 MED ORDER — IRBESARTAN 150 MG PO TABS
150.0000 mg | ORAL_TABLET | Freq: Every day | ORAL | Status: DC
  Administered 2022-07-14: 150 mg via ORAL
  Filled 2022-07-13: qty 1

## 2022-07-13 MED ORDER — TRAZODONE HCL 50 MG PO TABS
75.0000 mg | ORAL_TABLET | Freq: Every day | ORAL | Status: DC
  Administered 2022-07-13: 75 mg via ORAL
  Filled 2022-07-13: qty 2

## 2022-07-13 MED ORDER — UMECLIDINIUM BROMIDE 62.5 MCG/ACT IN AEPB
1.0000 | INHALATION_SPRAY | Freq: Every day | RESPIRATORY_TRACT | Status: DC
  Filled 2022-07-13: qty 7

## 2022-07-13 MED ORDER — MYCOPHENOLATE MOFETIL 250 MG PO CAPS
1000.0000 mg | ORAL_CAPSULE | Freq: Two times a day (BID) | ORAL | Status: DC
  Administered 2022-07-13 – 2022-07-14 (×2): 1000 mg via ORAL
  Filled 2022-07-13 (×3): qty 4

## 2022-07-13 MED ORDER — ACETAMINOPHEN 325 MG PO TABS: 650.0000 mg | ORAL_TABLET | Freq: Four times a day (QID) | ORAL | Status: DC | PRN

## 2022-07-13 MED ORDER — UMECLIDINIUM BROMIDE 62.5 MCG/ACT IN AEPB: 1.0000 | INHALATION_SPRAY | Freq: Every day | RESPIRATORY_TRACT | Status: DC

## 2022-07-13 MED ORDER — ENOXAPARIN SODIUM 40 MG/0.4ML IJ SOSY
40.0000 mg | PREFILLED_SYRINGE | INTRAMUSCULAR | Status: DC
  Administered 2022-07-13: 40 mg via SUBCUTANEOUS
  Filled 2022-07-13: qty 0.4

## 2022-07-13 MED ORDER — FLUOXETINE HCL 20 MG PO CAPS
20.0000 mg | ORAL_CAPSULE | Freq: Every day | ORAL | Status: DC
  Administered 2022-07-14: 20 mg via ORAL
  Filled 2022-07-13: qty 1

## 2022-07-13 MED ORDER — POLYETHYLENE GLYCOL 3350 17 G PO PACK: 17.0000 g | PACK | Freq: Every day | ORAL | Status: DC | PRN

## 2022-07-13 MED ORDER — SODIUM CHLORIDE 0.9 % IV SOLN: Freq: Once | INTRAVENOUS | Status: AC

## 2022-07-13 NOTE — ED Provider Triage Note (Signed)
Emergency Medicine Provider Triage Evaluation Note  Sabrina [name] , a 81 y.o. female  was evaluated in triage.  Pt complains of abdominal pain.  Patient notes increasingly worsening abdominal pain over the past few days.  She states she has been dealing with this for the past month multiple half.  2 prior scans available from August and October.  Reports associated symptoms of nausea with a couple episodes of emesis, decreased p.o. intake as well small liquid stools in worsening abdominal distention.  Denies fever, chills, night sweats, chest pain, shortness of breath.  Review of Systems  Positive:  Negative: See above  Physical Exam  BP 137/70 (BP Location: Right Arm)   Pulse 89   Temp 99.9 F (37.7 C) (Oral)   Resp 14   SpO2 94%  Gen:   Awake, no distress   Resp:  Normal effort  MSK:   Moves extremities without difficulty  Other:  Grossly distended abdomen.  Mild diffuse tenderness.  Lower extremity edema.  Medical Decision Making  Medically screening exam initiated at 12:07 PM.  Appropriate orders placed.  [name] was informed that the remainder of the evaluation will be completed by another provider, this initial triage assessment does not replace that evaluation, and the importance of remaining in the ED until their evaluation is complete.     Wilnette Kales, [place] 07/13/22 1209

## 2022-07-13 NOTE — ED Provider Notes (Signed)
[facility] EMERGENCY DEPARTMENT Provider Note   CSN: [id number] Arrival date & time: 07/13/22  1049     History  Chief Complaint  Patient presents with   Abdominal Pain    Sabrina [name] is a 81 y.o. female.  Pt is an 81 yo female with a pmhx significant for asthma, hypothyroidism, HLD, anxiety, MG, HTN, and OA.  Pt has been having issues with abd pain, decreased po intake, and nausea.  She was admitted from 10/22-24 for abd pain.  Possible carcinomatosis seen on CT, and the thought was to work up as an outpatient.  She did see Dr. Ninfa Linden on 11/3.  He asked IR to review patient to see if they could do a bx and paracentesis.  No appt made yet.  Pt's daughter said pt has not been able to get oob and pain has been worsening.  Due to this, daughter brought him here.       Home Medications Prior to Admission medications   Medication Sig Start Date End Date Taking? Authorizing Provider  ALPRAZolam Duanne Moron) 0.5 MG tablet Take 0.5 mg by mouth 4 (four) times daily as needed. 05/31/22   [provider]  Ascorbic Acid (VITAMIN C) 1000 MG tablet Take 1,000 mg by mouth every morning.     [provider]  aspirin EC 81 MG EC tablet Take 1 tablet (81 mg total) by mouth daily. Swallow whole. 04/13/21   Einar Pheasant, NP  atorvastatin (LIPITOR) 20 MG tablet Take 1 tablet (20 mg total) by mouth daily. 04/13/21   Einar Pheasant, NP  Cholecalciferol (VITAMIN D-3) 25 MCG (1000 UT) CAPS Take 1 capsule (1,000 Units total) by mouth every morning. 06/27/22   Shalhoub, Sherryll Burger, MD  diclofenac Sodium (VOLTAREN) 1 % GEL Apply 2 g topically every 6 (six) hours as needed (pain).    [provider]  enoxaparin (LOVENOX) 40 MG/0.4ML injection Inject 0.4 mLs (40 mg total) into the skin daily. Patient not taking: Reported on 06/25/2022 03/25/21   Eugenie Filler, MD  FLUoxetine (PROZAC) 20 MG capsule Take 20 mg by mouth daily. 04/08/22   [provider]   fluticasone (FLONASE) 50 MCG/ACT nasal spray Place 2 sprays into both nostrils daily. 07/27/17   [provider]  levothyroxine (SYNTHROID, LEVOTHROID) 75 MCG tablet Take 75 mcg by mouth every morning. MUST TAKE BRAND NAME SYNTHROID    [provider]  montelukast (SINGULAIR) 10 MG tablet Take 10 mg by mouth at bedtime. MUST BE BRAND NAME ONLY-SINGULAIR    [provider]  Multiple Vitamins-Minerals (CENTRUM SILVER) tablet Take 1 tablet by mouth daily. 02/16/11   [provider]  mycophenolate (CELLCEPT) 500 MG tablet Take 1,000 mg by mouth 2 (two) times daily.     [provider]  naproxen (NAPROSYN) 500 MG tablet Take 500 mg by mouth 2 (two) times daily. 06/07/22   [provider]  Omega 3 1200 MG CAPS Take 1,200 mg by mouth daily.    [provider]  oxyCODONE-acetaminophen (PERCOCET/ROXICET) 5-325 MG tablet Take 1 tablet by mouth every 8 (eight) hours as needed for severe pain. Patient not taking: Reported on 06/25/2022 01/11/22   Mcarthur Rossetti, MD  polyethylene glycol powder [facility]) 17 GM/SCOOP powder Take 255 g by mouth daily as needed (constipation refractory to daily senna use). 06/27/22   Shalhoub, Sherryll Burger, MD  senna (SENOKOT) 8.6 MG TABS tablet Take 2 tablets (17.2 mg total) by mouth at bedtime.  Do note take if you are having loose stools or diarrhea 06/27/22   Shalhoub, Sherryll Burger, MD  simethicone (MYLICON) 80 MG chewable tablet Chew 2 tablets (160 mg total) by mouth 4 (four) times daily as needed for flatulence. Patient not taking: Reported on 06/25/2022 03/25/21   Eugenie Filler, MD  traZODone (DESYREL) 150 MG tablet Take 75 mg by mouth at bedtime.     [provider]  Sabrina [name] 200-62.5-25 MCG/INH AEPB Inhale 1 puff into the lungs daily. 02/28/21   [provider]  valsartan (DIOVAN) 160 MG tablet Take 1 tablet (160 mg total) by mouth daily. 06/27/22 06/27/23  Shalhoub, Sherryll Burger, MD       Allergies    Azathioprine, Other, Aminoglycosides, Avelox [moxifloxacin hcl in nacl], Beta adrenergic blockers, Calcium channel blockers, Cephalosporins, Ciprofloxacin, Clindamycin/lincomycin, Colistin, Fenofibrate, Fosamax [alendronate sodium], Imuran [azathioprine sodium], Iodinated contrast media, Iodine, Macrolides and ketolides, Magnesium-containing compounds, Moxifloxacin, Niacin and related, Norfloxacin, Ofloxacin, Pefloxacin, Penicillamine, Prednisolone, Prednisone, Procainamide, Quinidine, Quinine derivatives, Succinylcholine, Tubocurarine, Vecuronium, and Vicodin [hydrocodone-acetaminophen]    Review of Systems   Review of Systems  Gastrointestinal:  Positive for abdominal pain.  All other systems reviewed and are negative.   Physical Exam Updated Vital Signs BP (!) 141/73   Pulse 89   Temp 99.9 F (37.7 C) (Oral)   Resp 14   Ht '5\' 5"'$  (1.651 m)   Wt 81.6 kg   SpO2 94%   BMI 29.95 kg/m  Physical Exam Vitals and nursing note reviewed.  Constitutional:      Appearance: She is well-developed.  HENT:     Head: Normocephalic and atraumatic.     Mouth/Throat:     Mouth: Mucous membranes are dry.  Eyes:     Extraocular Movements: Extraocular movements intact.     Pupils: Pupils are equal, round, and reactive to light.  Cardiovascular:     Rate and Rhythm: Normal rate and regular rhythm.     Heart sounds: Normal heart sounds.  Pulmonary:     Effort: Pulmonary effort is normal.     Breath sounds: Normal breath sounds.  Abdominal:     General: Bowel sounds are decreased. There is distension.     Palpations: Abdomen is soft. There is fluid wave.     Tenderness: There is generalized abdominal tenderness.  Skin:    General: Skin is warm.     Capillary Refill: Capillary refill takes less than 2 seconds.  Neurological:     General: No focal deficit present.     Mental Status: She is alert and oriented to person, place, and time.  Psychiatric:        Mood and Affect:  Mood normal.        Behavior: Behavior normal.     ED Results / Procedures / Treatments   Labs (all labs ordered are listed, but only abnormal results are displayed) Labs Reviewed  COMPREHENSIVE METABOLIC PANEL - Abnormal; Notable for the following components:      Result Value   Glucose, Bld 129 (*)    BUN 5 (*)    Total Protein 5.5 (*)    Albumin 3.1 (*)    All other components within normal limits  CBC WITH DIFFERENTIAL/PLATELET - Abnormal; Notable for the following components:   WBC 16.4 (*)    Platelets 431 (*)    Neutro Abs 14.7 (*)    Lymphs Abs 0.6 (*)    Abs Immature Granulocytes 0.10 (*)    All other components  within normal limits  BODY FLUID CULTURE W GRAM STAIN  LIPASE, BLOOD  AMMONIA  PROTIME-INR  URINALYSIS, ROUTINE W REFLEX MICROSCOPIC  LACTATE DEHYDROGENASE, PLEURAL OR PERITONEAL FLUID  GLUCOSE, PLEURAL OR PERITONEAL FLUID  PROTEIN, PLEURAL OR PERITONEAL FLUID  ALBUMIN, PLEURAL OR PERITONEAL FLUID   CYTOLOGY - NON PAP    EKG None  Radiology CT ABDOMEN PELVIS WO CONTRAST  Result Date: 07/13/2022 CLINICAL DATA:  Abdominal pain * Tracking Code: BO * EXAM: CT ABDOMEN AND PELVIS WITHOUT CONTRAST TECHNIQUE: Multidetector CT imaging of the abdomen and pelvis was performed following the standard protocol without IV contrast. RADIATION DOSE REDUCTION: This exam was performed according to the departmental dose-optimization program which includes automated exposure control, adjustment of the mA and/or kV according to patient size and/or use of iterative reconstruction technique. COMPARISON:  06/25/2022 FINDINGS: Lower chest: No acute abnormality.  Coronary artery calcifications. Hepatobiliary: Coarse contour of the liver. No focal liver abnormality is seen. Status post cholecystectomy. No biliary dilatation. Pancreas: Unremarkable. No pancreatic ductal dilatation or surrounding inflammatory changes. Spleen: Normal in size without significant abnormality.  Adrenals/Urinary Tract: Adrenal glands are unremarkable. Punctuate nonobstructive calculus of the lateral midportion of the right kidney (series 3, image 34). No left-sided calculi, ureteral calculi, or hydronephrosis. Bladder is unremarkable. Stomach/Bowel: Stomach is within normal limits. Appendix appears normal. No evidence of bowel wall thickening, distention, or inflammatory changes. Vascular/Lymphatic: Aortic atherosclerosis. No enlarged abdominal or pelvic lymph nodes. Reproductive: Status post hysterectomy. Other: Unchanged appearance of fat, fluid, and soft tissue stranding within a midline ventral epigastric hernia (series 3, image 23) and a ventral right hemiabdomen hernia (series 3, image 40). No evidence of bowel involvement. There is again seen extensive fat stranding and nodularity within the omentum, as well as within the low pelvis (series 3, image 33, 64). Large volume ascites throughout the abdomen and pelvis, slightly increased compared to prior examination. Musculoskeletal: No acute or significant osseous findings. IMPRESSION: 1. Large volume ascites throughout the abdomen and pelvis, slightly increased compared to prior examination. 2. Unchanged appearance of fat, fluid, and soft tissue stranding within a midline ventral epigastric hernia and a ventral right hemiabdomen hernia. No evidence of bowel involvement. 3. There is again seen extensive fat stranding and nodularity within the omentum, as well as within the low pelvis. This appearance is highly concerning for peritoneal metastatic disease. 4. Coarse contour of the liver, suggestive of cirrhosis. 5. Nonobstructive right nephrolithiasis. 6. Coronary artery disease. Aortic Atherosclerosis (ICD10-I70.0). Electronically Signed   By: Delanna Ahmadi M.D.   On: 07/13/2022 13:23    Procedures Procedures    Medications Ordered in ED Medications  atorvastatin (LIPITOR) tablet 20 mg (has no administration in time range)  irbesartan (AVAPRO)  tablet 150 mg (has no administration in time range)  FLUoxetine (PROZAC) capsule 20 mg (has no administration in time range)  ALPRAZolam (XANAX) tablet 0.5 mg (has no administration in time range)  traZODone (DESYREL) tablet 75 mg (has no administration in time range)  levothyroxine (SYNTHROID) tablet 75 mcg (has no administration in time range)  mycophenolate (CELLCEPT) tablet 1,000 mg (has no administration in time range)  montelukast (SINGULAIR) tablet 10 mg (has no administration in time range)  enoxaparin (LOVENOX) injection 40 mg (has no administration in time range)  sodium chloride flush (NS) 0.9 % injection 3 mL (has no administration in time range)  acetaminophen (TYLENOL) tablet 650 mg (has no administration in time range)    Or  acetaminophen (TYLENOL) suppository 650 mg (has no  administration in time range)  polyethylene glycol (MIRALAX / GLYCOLAX) packet 17 g (has no administration in time range)  fluticasone furoate-vilanterol (BREO ELLIPTA) 200-25 MCG/ACT 1 puff (has no administration in time range)    And  umeclidinium bromide (INCRUSE ELLIPTA) 62.5 MCG/ACT 1 puff (has no administration in time range)  fentaNYL (SUBLIMAZE) injection 25 mcg (25 mcg Intravenous Given 07/13/22 1429)  0.9 %  sodium chloride infusion ( Intravenous New Bag/Given 07/13/22 1429)  lidocaine (XYLOCAINE) 1 % (with pres) injection 20 mL (20 mLs Intradermal Given by Other 07/13/22 1515)    ED Course/ Medical Decision Making/ A&P                           Medical Decision Making Amount and/or Complexity of Data Reviewed Labs: ordered.  Risk Prescription drug management. Decision regarding hospitalization.   This patient presents to the ED for concern of abd pain, this involves an extensive number of treatment options, and is a complaint that carries with it a high risk of complications and morbidity.  The differential diagnosis includes ascites, infection, hernia problem   Co morbidities that  complicate the patient evaluation  asthma, hypothyroidism, HLD, anxiety, MG, HTN, and OA   Additional history obtained:  Additional history obtained from epic chart review External records from outside source obtained and reviewed including daughter   Lab Tests:  I Ordered, and personally interpreted labs.  The pertinent results include:  cbc with wbc elevated at 16.4, cmp nl, lip nl   Imaging Studies ordered:  I ordered imaging studies including CT abd/pelvis  I independently visualized and interpreted imaging which showed  1. Large volume ascites throughout the abdomen and pelvis, slightly  increased compared to prior examination.  2. Unchanged appearance of fat, fluid, and soft tissue stranding  within a midline ventral epigastric hernia and a ventral right  hemiabdomen hernia. No evidence of bowel involvement.  3. There is again seen extensive fat stranding and nodularity within  the omentum, as well as within the low pelvis. This appearance is  highly concerning for peritoneal metastatic disease.  4. Coarse contour of the liver, suggestive of cirrhosis.  5. Nonobstructive right nephrolithiasis.  6. Coronary artery disease.    Aortic Atherosclerosis (ICD10-I70.0).   I agree with the radiologist interpretation   Cardiac Monitoring:  The patient was maintained on a cardiac monitor.  I personally viewed and interpreted the cardiac monitored which showed an underlying rhythm of: nsr   Medicines ordered and prescription drug management:  I ordered medication including fentanyl  for pain  Reevaluation of the patient after these medicines showed that the patient improved I have reviewed the patients home medicines and have made adjustments as needed   Test Considered:  ct   Critical Interventions:  Pain control   Consultations Obtained:  I requested consultation with IR (Dr. Dwaine Gale),  and discussed lab and imaging findings as well as pertinent plan - he will put  pt on the schedule to do a paracentesis today.   Pt d/w Dr. Trilby Drummer ([facility]) for admission  Problem List / ED Course:  Ascites/abd pain:  pt likely has a malignancy.  Fluid from ascites will be sent for cytology.  Pt has been trying to do everything as an outpatient, but she is getting worse.  Pt d/w Dr. Trilby Drummer who will admit.   Reevaluation:  After the interventions noted above, I reevaluated the patient and found that they have :stayed the same  Social Determinants of Health:  Lives at home   Dispostion:  After consideration of the diagnostic results and the patients response to treatment, I feel that the patent would benefit from admission.          Final Clinical Impression(s) / ED Diagnoses Final diagnoses:  Other ascites  Generalized abdominal pain  Failure to thrive in adult    Rx / DC Orders ED Discharge Orders     None         Isla Pence, MD 07/13/22 1531

## 2022-07-13 NOTE — Procedures (Signed)
PROCEDURE SUMMARY:  Successful US guided paracentesis from left abdomen.  Yielded 3 L of clear yellow fluid.  No immediate complications.  Pt tolerated well.   Specimen sent for labs.  EBL < 2 mL  Theresa Duty, NP 07/13/2022 4:35 PM

## 2022-07-13 NOTE — H&P (Signed)
History and Physical   Sabrina [name] QMG:[id number] DOB: 1941/05/26 DOA: 07/13/2022  PCP: Ginger Organ., MD   Patient coming from: Home  Chief Complaint: Abdominal pain  HPI: Sabrina [name] is a 81 y.o. female with medical history significant of hypertension, myasthenia gravis, diastolic CHF, hypothyroidism, anxiety, stroke, asthma presenting with ongoing abdominal pain.  Patient was seen in October for this abdominal pain with concern for imaging findings that could be consistent with carcinomatosis.  Ultimately work-up was deferred to outpatient and she did see general surgery outpatient who had planned for outpatient IR evaluation however this has not yet been done.  Due to worsening pain patient presented to the ED today for expedited work-up, there is concern that she is a poor operative candidate.  She denies fevers, chills, chest pain, shortness of breath, constipation, diarrhea, nausea, vomiting.  ED Course: Vital signs in the ED stable.  Lab work-up included CMP with glucose 129, protein 5.5, albumin 3.1.  CBC with leukocytosis to 16.4 and platelets of 431.  PT and INR pending.  Lipase 41.  Ammonia level and urinalysis pending.  Labs ordered for planned paracentesis include LDH, glucose, protein, albumin, culture, cytology.  CT of the abdomen pelvis redemonstrated large volume ascites slightly increased in previous with stable soft tissue stranding and ventral hernia as well as stranding and nodularity at the omentum concerning for carcinomatosis.  Liver changes that could be consistent with cirrhosis also noted.  IR consulted and plan for paracentesis today and biopsy while admitted.  Review of Systems: As per HPI otherwise all other systems reviewed and are negative.  Past Medical History:  Diagnosis Date   Anxiety    Arthritis    Asthma    Avascular necrosis (HCC)    Bradycardia    symptomatic   Cancer (HCC)    COUPLE OF SKIN CANCERS   Carpal tunnel syndrome    LEFT  --NUMBNESS   Cataracts, bilateral    Cerebral aneurysm    Complication of anesthesia    BP FALLS/"ANAPHYLACTIC SHOCK"/ EXTREME TREMORS;  DID  FINE WITH SURGERIES APRIL AND OCT 2013 AT [facility] SURGERIES WERE SHORT PROCEDURES--PROBLEMS WITH ANESTHESIA SEEM TO OCCUR WITH THE LONGER PROCEDURES.THE TREMORS WERE AFTER HIP REPLACEMNT 22011-ARMS WERE FLAILING--PT STATES IT TOOK 4 DOSES OF DEMEROL BEFORE THE TREMORS STOPPED.   Depression    History of skin cancer    HSV (herpes simplex virus) infection    CHEST   Hyperlipidemia    Hypertension    PT HAS LOST WEIGHT--B/P NOW WITHIN NORMALS--IS ON B/P MEDICINE   Hypertriglyceridemia    Hypothyroidism    Myasthenia gravis    DOING WELL ON CELLCEPT-FOLLOWED BY DR. LISA HOBSON - [facility]   OA (osteoarthritis) of hip    Osteopenia    Osteoporosis    Pain    LEFT SHOULDER PAIN-TORN ROTATOR CUFF-VERY LIMITED ROM   Pain    TOP OF RIGHT SHOULDER--ROM OK AT PRESENT   Pneumonia    Rash, skin    LOWER ABDOMEN-FUNGAL INFECTION-PT HAS TOPICAL OINTMENT TO USE AS NEEDED.   Seasonal allergies    Thyroid disease     Past Surgical History:  Procedure Laterality Date   BELPHAROPTOSIS REPAIR  01/2006   PTOSIS REPAIR BILATERAL   CARPAL TUNNEL RELEASE  05/31/2012   Procedure: CARPAL TUNNEL RELEASE;  Surgeon: Mcarthur Rossetti, MD;  Location: [facility];  Service: Orthopedics;  Laterality: Left;  Left Open Carpal Tunnel Release   CARPAL TUNNEL RELEASE  10/10/2012   Procedure: CARPAL TUNNEL RELEASE;  Surgeon: Nita Sells, MD;  Location: [facility];  Service: Orthopedics;  Laterality: Left;   CEREBRAL ANEURYSM REPAIR  10/2005   GUGLIEMI COILS TO REPAIR ANEURYSM; NO RESIDUAL PROBLEMS--HAS FOLLOW UP MRI EVERY 1 OR 2 YRS   CHOLECYSTECTOMY     EXTREME DROP IN BLOOD PRESSURE WITH THIS SURGERY- YRS AGO- PT WAS 81 YRS OLD   JOINT REPLACEMENT  10/19/09   RT HIP--SEVERE TREMORS, FLAILING OF ARMS AFTER THIS SURGERY.   left shoulder arthroscopy       REVERSE SHOULDER ARTHROPLASTY  10/10/2012   Procedure: REVERSE SHOULDER ARTHROPLASTY;  Surgeon: Nita Sells, MD;  Location: [facility];  Service: Orthopedics;  Laterality: Left;   TONSILLECTOMY AND ADENOIDECTOMY     TOTAL HIP ARTHROPLASTY     right   TUBAL LIGATION     ANAPHYLACTIC SHOCK AFTER THIS SURGERY-CAUSE THOUGHT TO  BE RELATED TO SODIUM PENTOTHAL    Social History  reports that she has never smoked. She has never used smokeless tobacco. She reports that she does not currently use alcohol. She reports that she does not use drugs.  Allergies  Allergen Reactions   Azathioprine Anaphylaxis and Other (See Comments)    Kidneys shut down   Other Anaphylaxis    Sodium pentathol Uncoded Allergy. Allergen: Sodium pentathol   Aminoglycosides     Tobramycin, gentamycin, kanamycin, neomycin, streptomycin Can't take because of myasthenia gravis   Avelox [Moxifloxacin Hcl In Nacl] Other (See Comments)    Can't take because of myasthenia gravis   Beta Adrenergic Blockers     Proponolol, timolol maleate eyedrops Can't take because of myasthenia gravis   Calcium Channel Blockers     Blood pressure medications Can't take because of myasthenia gravis   Cephalosporins     Can't take because of myasthenia gravis   Ciprofloxacin Other (See Comments)    Can't take because of myasthenia gravis Can't take because of myasthenia gravis   Clindamycin/Lincomycin     May exascerbate myasthenia gravis   Colistin     Can't take because of myasthenia gravis   Fenofibrate     Other reaction(s): GI upset, myalgia   Fosamax [Alendronate Sodium]     LEG WEAKNESS   Imuran [Azathioprine Sodium] Other (See Comments)    Kidneys shut down   Iodinated Contrast Media Other (See Comments)    Contraindicated to Myasthenia Gravis   Iodine     Iodine contrast Can't take because of myasthenia gravis   Macrolides And Ketolides     Erythromocin, azithromycin, telithromycin Can't take because of  myasthenia gravis   Magnesium-Containing Compounds     Including milk of magnesia, antacids containing magnesium hydroxide (maalox, mylanta) and epsom salts Can't take because of myasthenia gravis   Moxifloxacin Other (See Comments)    Can't take because of myasthenia gravis   Niacin And Related     Other reaction(s): leg aches   Norfloxacin Other (See Comments)    Can't take because of myasthenia gravis Can't take because of myasthenia gravis   Ofloxacin Other (See Comments)    Can't take because of myasthenia gravis Can't take because of myasthenia gravis   Pefloxacin Other (See Comments)    Can't take because of myasthenia gravis Can't take because of myasthenia gravis   Penicillamine     do not use due to Myasthenia Gravis     Prednisolone Nausea And Vomiting   Prednisone Nausea And Vomiting   Procainamide  Can't take because of myasthenia gravis   Quinidine     Can't take because of myasthenia gravis   Quinine Derivatives     Can't take because of myasthenia gravis   Succinylcholine     Can't take because of myasthenia gravis No paralytics   Tubocurarine     Can't take because of myasthenia gravis   Vecuronium     Can't take because of myasthenia gravis No paralytics   Vicodin [Hydrocodone-Acetaminophen] Other (See Comments)    Pt suffers from bad headaches    Family History  Problem Relation Age of Onset   Hypertension Mother    Heart disease Mother        heart attack   Stroke Father    Heart disease Father        heart attack   Breast cancer Neg Hx   Reviewed on admission  Prior to Admission medications   Medication Sig Start Date End Date Taking? Authorizing Provider  ALPRAZolam Duanne Moron) 0.5 MG tablet Take 0.5 mg by mouth 4 (four) times daily as needed. 05/31/22   [provider]  Ascorbic Acid (VITAMIN C) 1000 MG tablet Take 1,000 mg by mouth every morning.     [provider]  aspirin EC 81 MG EC tablet Take 1 tablet (81 mg total)  by mouth daily. Swallow whole. 04/13/21   Einar Pheasant, NP  atorvastatin (LIPITOR) 20 MG tablet Take 1 tablet (20 mg total) by mouth daily. 04/13/21   Einar Pheasant, NP  Cholecalciferol (VITAMIN D-3) 25 MCG (1000 UT) CAPS Take 1 capsule (1,000 Units total) by mouth every morning. 06/27/22   Shalhoub, Sherryll Burger, MD  diclofenac Sodium (VOLTAREN) 1 % GEL Apply 2 g topically every 6 (six) hours as needed (pain).    [provider]  enoxaparin (LOVENOX) 40 MG/0.4ML injection Inject 0.4 mLs (40 mg total) into the skin daily. Patient not taking: Reported on 06/25/2022 03/25/21   Eugenie Filler, MD  FLUoxetine (PROZAC) 20 MG capsule Take 20 mg by mouth daily. 04/08/22   [provider]  fluticasone (FLONASE) 50 MCG/ACT nasal spray Place 2 sprays into both nostrils daily. 07/27/17   [provider]  levothyroxine (SYNTHROID, LEVOTHROID) 75 MCG tablet Take 75 mcg by mouth every morning. MUST TAKE BRAND NAME SYNTHROID    [provider]  montelukast (SINGULAIR) 10 MG tablet Take 10 mg by mouth at bedtime. MUST BE BRAND NAME ONLY-SINGULAIR    [provider]  Multiple Vitamins-Minerals (CENTRUM SILVER) tablet Take 1 tablet by mouth daily. 02/16/11   [provider]  mycophenolate (CELLCEPT) 500 MG tablet Take 1,000 mg by mouth 2 (two) times daily.     [provider]  naproxen (NAPROSYN) 500 MG tablet Take 500 mg by mouth 2 (two) times daily. 06/07/22   [provider]  Omega 3 1200 MG CAPS Take 1,200 mg by mouth daily.    [provider]  oxyCODONE-acetaminophen (PERCOCET/ROXICET) 5-325 MG tablet Take 1 tablet by mouth every 8 (eight) hours as needed for severe pain. Patient not taking: Reported on 06/25/2022 01/11/22   Mcarthur Rossetti, MD  polyethylene glycol powder [facility]) 17 GM/SCOOP powder Take 255 g by mouth daily as needed (constipation refractory to daily senna use). 06/27/22   Shalhoub, Sherryll Burger, MD  senna  (SENOKOT) 8.6 MG TABS tablet Take 2 tablets (17.2 mg total) by mouth at bedtime. Do note take if you are having loose stools or diarrhea 06/27/22   Shalhoub,  Sherryll Burger, MD  simethicone (MYLICON) 80 MG chewable tablet Chew 2 tablets (160 mg total) by mouth 4 (four) times daily as needed for flatulence. Patient not taking: Reported on 06/25/2022 03/25/21   Eugenie Filler, MD  traZODone (DESYREL) 150 MG tablet Take 75 mg by mouth at bedtime.     [provider]  [name] 200-62.5-25 MCG/INH AEPB Inhale 1 puff into the lungs daily. 02/28/21   [provider]  valsartan (DIOVAN) 160 MG tablet Take 1 tablet (160 mg total) by mouth daily. 06/27/22 06/27/23  Vernelle Emerald, MD    Physical Exam: Vitals:   07/13/22 1147 07/13/22 1229 07/13/22 1451 07/13/22 1522  BP: 137/70  129/71 (!) 141/73  Pulse: 89     Resp: 14     Temp: 99.9 F (37.7 C)     TempSrc: Oral     SpO2: 94%     Weight:  81.6 kg    Height:  '5\' 5"'$  (1.651 m)      Physical Exam Constitutional:      General: She is not in acute distress.    Appearance: Normal appearance. She is obese.  HENT:     Head: Normocephalic and atraumatic.     Mouth/Throat:     Mouth: Mucous membranes are moist.     Pharynx: Oropharynx is clear.  Eyes:     Extraocular Movements: Extraocular movements intact.     Pupils: Pupils are equal, round, and reactive to light.  Cardiovascular:     Rate and Rhythm: Normal rate and regular rhythm.     Pulses: Normal pulses.     Heart sounds: Normal heart sounds.  Pulmonary:     Effort: Pulmonary effort is normal. No respiratory distress.     Breath sounds: Normal breath sounds.  Abdominal:     General: Bowel sounds are normal. There is distension (mild).     Palpations: Abdomen is soft.     Tenderness: There is no abdominal tenderness.  Musculoskeletal:        General: No swelling or deformity.  Skin:    General: Skin is warm and dry.  Neurological:     General: No focal  deficit present.     Mental Status: Mental status is at baseline.    Labs on Admission: I have personally reviewed following labs and imaging studies  CBC: Recent Labs  Lab 07/13/22 1214  WBC 16.4*  NEUTROABS 14.7*  HGB 13.3  HCT 41.7  MCV 87.8  PLT 431*    Basic Metabolic Panel: Recent Labs  Lab 07/13/22 1214  NA 135  K 3.8  CL 100  CO2 24  GLUCOSE 129*  BUN 5*  CREATININE 0.80  CALCIUM 8.9    GFR: Estimated Creatinine Clearance: 58.2 mL/min (by C-G formula based on SCr of 0.8 mg/dL).  Liver Function Tests: Recent Labs  Lab 07/13/22 1214  AST 22  ALT 14  ALKPHOS 93  BILITOT 1.0  PROT 5.5*  ALBUMIN 3.1*    Urine analysis:    Component Value Date/Time   COLORURINE YELLOW 06/25/2022 0708   APPEARANCEUR CLEAR 06/25/2022 0708   LABSPEC <1.005 (L) 06/25/2022 0708   PHURINE 7.0 06/25/2022 0708   GLUCOSEU NEGATIVE 06/25/2022 0708   HGBUR NEGATIVE 06/25/2022 0708   BILIRUBINUR NEGATIVE 06/25/2022 0708   KETONESUR NEGATIVE 06/25/2022 0708   PROTEINUR NEGATIVE 06/25/2022 0708   UROBILINOGEN 0.2 10/04/2012 1445   NITRITE NEGATIVE 06/25/2022 0708   LEUKOCYTESUR NEGATIVE 06/25/2022 0708    Radiological  Exams on Admission: CT ABDOMEN PELVIS WO CONTRAST  Result Date: 07/13/2022 CLINICAL DATA:  Abdominal pain * Tracking Code: BO * EXAM: CT ABDOMEN AND PELVIS WITHOUT CONTRAST TECHNIQUE: Multidetector CT imaging of the abdomen and pelvis was performed following the standard protocol without IV contrast. RADIATION DOSE REDUCTION: This exam was performed according to the departmental dose-optimization program which includes automated exposure control, adjustment of the mA and/or kV according to patient size and/or use of iterative reconstruction technique. COMPARISON:  06/25/2022 FINDINGS: Lower chest: No acute abnormality.  Coronary artery calcifications. Hepatobiliary: Coarse contour of the liver. No focal liver abnormality is seen. Status post cholecystectomy. No  biliary dilatation. Pancreas: Unremarkable. No pancreatic ductal dilatation or surrounding inflammatory changes. Spleen: Normal in size without significant abnormality. Adrenals/Urinary Tract: Adrenal glands are unremarkable. Punctuate nonobstructive calculus of the lateral midportion of the right kidney (series 3, image 34). No left-sided calculi, ureteral calculi, or hydronephrosis. Bladder is unremarkable. Stomach/Bowel: Stomach is within normal limits. Appendix appears normal. No evidence of bowel wall thickening, distention, or inflammatory changes. Vascular/Lymphatic: Aortic atherosclerosis. No enlarged abdominal or pelvic lymph nodes. Reproductive: Status post hysterectomy. Other: Unchanged appearance of fat, fluid, and soft tissue stranding within a midline ventral epigastric hernia (series 3, image 23) and a ventral right hemiabdomen hernia (series 3, image 40). No evidence of bowel involvement. There is again seen extensive fat stranding and nodularity within the omentum, as well as within the low pelvis (series 3, image 33, 64). Large volume ascites throughout the abdomen and pelvis, slightly increased compared to prior examination. Musculoskeletal: No acute or significant osseous findings. IMPRESSION: 1. Large volume ascites throughout the abdomen and pelvis, slightly increased compared to prior examination. 2. Unchanged appearance of fat, fluid, and soft tissue stranding within a midline ventral epigastric hernia and a ventral right hemiabdomen hernia. No evidence of bowel involvement. 3. There is again seen extensive fat stranding and nodularity within the omentum, as well as within the low pelvis. This appearance is highly concerning for peritoneal metastatic disease. 4. Coarse contour of the liver, suggestive of cirrhosis. 5. Nonobstructive right nephrolithiasis. 6. Coronary artery disease. Aortic Atherosclerosis (ICD10-I70.0). Electronically Signed   By: Delanna Ahmadi M.D.   On: 07/13/2022 13:23     EKG: Not performed in the emergency department  Assessment/Plan Principal Problem:   Abdominal mass Active Problems:   Chronic diastolic CHF (congestive heart failure) (HCC)   Essential hypertension   Hypothyroidism   Myasthenia gravis, 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HCC)   Anxiety   History of CVA (cerebrovascular accident)   Abdominal mass  Abdominal pain Ascites > Patient presenting with ongoing ascites and abdominal pain with abdominal mass which is undergoing work-up outpatient. > Due to worsening pain and current pace of outpatient work-up patient represented due to need for expedited work-up.  Noted outpatient to likely be a poor surgical candidate. > Has continued large volume ascites, stable soft tissue stranding at ventral hernia and continued stranding and nodularity at omentum concerning for mets/carcinomatosis.  Also noted was some liver changes consistent with cirrhosis. > IR consulted and paracentesis done today - Appreciate IR assistance with this patient - Follow-up results of paracentesis including LDH, glucose, protein, albumin, culture, cytology. - Biopsy per IR - Trend fever curve and WBC  Leukocytosis > WBC of 16.4 in ED.  Do not suspect SBP given no other tender to palpation status post paracentesis.  We will have culture to follow-up either way. > No respiratory symptoms. - Follow-up urinalysis - Trend WBC  Myasthenia gravis > Patient and family have notes with them to indicate neurologist has reminded patient that she is not to have IV contrast for CT, and is to take oxycodone only for pain.  Also notes that patient may only take for antibiotics: Penicillin, amoxicillin, Bactrim, Keflex. - Continue home CellCept   Chronic hernia  > Patient been evaluated and is not a surgical candidate. - Continue to monitor  Hypertension - Continue home valsartan  History of CVA ?HLD - Continue home atorvastatin  Anxiety - Continue home Xanax, fluoxetine,  trazodone  Asthma - Replace home Trelegy with formulary Breo and Incruse - Continue home Singulair  Hypothyroidism - Continue home Synthroid  History of diastolic CHF > Last echo in 2022 with EF 60 and 65% and grade 1 diastolic dysfunction with normal RV function. - Not currently on any medication for this.  DVT prophylaxis: Lovenox Code Status:   Full Family Communication:  Updated at bedside Disposition Plan:   Patient is from:  Home  Anticipated DC to:  Home  Anticipated DC date:  1 to 3 days  Anticipated DC barriers: None  Consults called:  IR, will see the patient Admission status:  Observation, telemetry  Severity of Illness: The appropriate patient status for this patient is OBSERVATION. Observation status is judged to be reasonable and necessary in order to provide the required intensity of service to ensure the patient's safety. The patient's presenting symptoms, physical exam findings, and initial radiographic and laboratory data in the context of their medical condition is felt to place them at decreased risk for further clinical deterioration. Furthermore, it is anticipated that the patient will be medically stable for discharge from the hospital within 2 midnights of admission.    Marcelyn Bruins MD [facility]  How to contact the [facility] Attending or Consulting provider [place] or covering provider during after hours [place], for this patient?   Check the care team in [facility] and look for a) attending/consulting [facility] provider listed and b) the [facility] team listed Log into [email or web address] and use [facility]'s universal password to access. If you do not have the password, please contact the hospital operator. Locate the [facility] provider you are looking for under [facility] and page to a number that you can be directly reached. If you still have difficulty reaching the provider, please page the [facility] (Director on Call) for the Hospitalists listed on amion for  assistance.  07/13/2022, 3:31 PM

## 2022-07-13 NOTE — ED Triage Notes (Signed)
Pt presents with diffuse abd pain and a distended abd. Pt was seen at [facility] on 10/22 for same. Pt also reports hypokalemia. Pt is feeling worse today with sour burps, worsening abd pain, and liquid diarrhea x 1 week.

## 2022-07-13 NOTE — ED Notes (Signed)
Pt updated on plan of care in lobby. No new complaints at this time

## 2022-07-14 DIAGNOSIS — I5032 Chronic diastolic (congestive) heart failure: Secondary | ICD-10-CM | POA: Diagnosis not present

## 2022-07-14 DIAGNOSIS — R188 Other ascites: Secondary | ICD-10-CM | POA: Diagnosis not present

## 2022-07-14 DIAGNOSIS — R1900 Intra-abdominal and pelvic swelling, mass and lump, unspecified site: Secondary | ICD-10-CM | POA: Diagnosis not present

## 2022-07-14 DIAGNOSIS — I10 Essential (primary) hypertension: Secondary | ICD-10-CM | POA: Diagnosis not present

## 2022-07-14 DIAGNOSIS — R1909 Other intra-abdominal and pelvic swelling, mass and lump: Secondary | ICD-10-CM | POA: Diagnosis not present

## 2022-07-14 DIAGNOSIS — E039 Hypothyroidism, unspecified: Secondary | ICD-10-CM | POA: Diagnosis not present

## 2022-07-14 DIAGNOSIS — G7000 Myasthenia gravis without (acute) exacerbation: Secondary | ICD-10-CM | POA: Diagnosis not present

## 2022-07-14 DIAGNOSIS — Z8673 Personal history of transient ischemic attack (TIA), and cerebral infarction without residual deficits: Secondary | ICD-10-CM | POA: Diagnosis not present

## 2022-07-14 DIAGNOSIS — F419 Anxiety disorder, unspecified: Secondary | ICD-10-CM | POA: Diagnosis not present

## 2022-07-14 LAB — CBC
HCT: 36.7 % (ref 36.0–46.0)
Hemoglobin: 12 g/dL (ref 12.0–15.0)
MCH: 28.6 pg (ref 26.0–34.0)
MCHC: 32.7 g/dL (ref 30.0–36.0)
MCV: 87.6 fL (ref 80.0–100.0)
Platelets: 311 10*3/uL (ref 150–400)
RBC: 4.19 MIL/uL (ref 3.87–5.11)
RDW: 13.6 % (ref 11.5–15.5)
WBC: 8.7 10*3/uL (ref 4.0–10.5)
nRBC: 0 % (ref 0.0–0.2)

## 2022-07-14 LAB — COMPREHENSIVE METABOLIC PANEL
ALT: 13 U/L (ref 0–44)
AST: 19 U/L (ref 15–41)
Albumin: 2.5 g/dL — ABNORMAL LOW (ref 3.5–5.0)
Alkaline Phosphatase: 79 U/L (ref 38–126)
Anion gap: 7 (ref 5–15)
BUN: 7 mg/dL — ABNORMAL LOW (ref 8–23)
CO2: 26 mmol/L (ref 22–32)
Calcium: 8.4 mg/dL — ABNORMAL LOW (ref 8.9–10.3)
Chloride: 102 mmol/L (ref 98–111)
Creatinine, Ser: 0.64 mg/dL (ref 0.44–1.00)
GFR, Estimated: >60 mL/min (ref >60)
Glucose, Bld: 116 mg/dL — ABNORMAL HIGH (ref 70–99)
Potassium: 3.2 mmol/L — ABNORMAL LOW (ref 3.5–5.1)
Sodium: 135 mmol/L (ref 135–145)
Total Bilirubin: 1.1 mg/dL (ref 0.3–1.2)
Total Protein: 4.5 g/dL — ABNORMAL LOW (ref 6.5–8.1)

## 2022-07-14 MED ORDER — ONDANSETRON HCL 4 MG PO TABS: 4.0000 mg | ORAL_TABLET | Freq: Three times a day (TID) | ORAL | 0 refills | Status: DC | PRN

## 2022-07-14 MED ORDER — ONDANSETRON HCL 4 MG PO TABS: 4.0000 mg | ORAL_TABLET | Freq: Three times a day (TID) | ORAL | Status: DC | PRN

## 2022-07-14 MED ORDER — POTASSIUM CHLORIDE 20 MEQ PO PACK
40.0000 meq | PACK | Freq: Once | ORAL | Status: AC
  Administered 2022-07-14: 40 meq via ORAL
  Filled 2022-07-14: qty 2

## 2022-07-14 NOTE — Plan of Care (Signed)

## 2022-07-14 NOTE — Discharge Summary (Signed)
Physician Discharge Summary   Patient: Sabrina [name] MRN: [id number] DOB: [birth year]  Admit date:     07/13/2022  Discharge date: 07/14/22  Discharge Physician: Patrecia Pour   PCP: Ginger Organ., MD   Recommendations at discharge:  Follow up with PCP early next week to review cytology results from paracentesis performed 11/9.   Discharge Diagnoses: Principal Problem:   Abdominal mass Active Problems:   Chronic diastolic CHF (congestive heart failure) (HCC)   Essential hypertension   Hypothyroidism   Myasthenia gravis, 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HCC)   Anxiety   History of CVA (cerebrovascular accident)   Ascites  Hospital Course: Sabrina [name] is an 81 y.o. female with a history significant of hypertension, myasthenia gravis, diastolic CHF, hypothyroidism, anxiety, stroke, asthma presenting with ongoing abdominal pain.   Patient was seen in October for this abdominal pain with concern for imaging findings that could be consistent with carcinomatosis.  Ultimately work-up was deferred to outpatient and she did see general surgery outpatient who had planned for outpatient IR evaluation however this has not yet been done.  Due to worsening pain patient presented to the ED today for expedited work-up, there is concern that she is a poor operative candidate.   She denies fevers, chills, chest pain, shortness of breath, constipation, diarrhea, nausea, vomiting.   ED Course: Vital signs in the ED stable.  Lab work-up included CMP with glucose 129, protein 5.5, albumin 3.1.  CBC with leukocytosis to 16.4 and platelets of 431.  PT and INR pending.  Lipase 41.  Ammonia level and urinalysis pending.  Labs ordered for planned paracentesis include LDH, glucose, protein, albumin, culture, cytology.  CT of the abdomen pelvis redemonstrated large volume ascites slightly increased in previous with stable soft tissue stranding and ventral hernia as well as stranding and nodularity at the omentum  concerning for carcinomatosis.  Liver changes that could be consistent with cirrhosis also noted.  IR consulted and performed 3L paracentesis 11/9.   Hospital course: Cell counts not consistent with peritonitis. Pain is significantly improved, not requiring any medications. She's tolerating a diet, at her functional baseline, and has reliable follow up with her PCP, Dr. Brigitte Pulse, who she prefers to follow up with for cytology results since they remain pending at this time.   Assessment and Plan: Abdominal mass  Abdominal pain Ascites (malignant vs. cirrhotic): SAAG is equivocal (either 0.6 or 1.2 depending on which value used for albumin drawn < 24 hours apart). Hepatic irregularity noted by imaging. LFTs wnl. INR 1.2.  - s/p diagnostic and therapeutic paracentesis. Please follow up cytology from that procedure which is pending and unlikely to result until next week. If negative, would then need biopsy.  - Pain has resolved.     Leukocytosis: WBC of 16.4 in ED.  Do not suspect SBP. PMN count 97, negative gram stain, culture pending. WBC normalized overnight without antimicrobial therapy, remains afebrile. No urinary or respiratory symptoms. - Follow up culture data.    Myasthenia gravis: Patient and family have notes with them to indicate neurologist has reminded patient that she is not to have IV contrast for CT, and is to take oxycodone only for pain.  Also notes that patient may only take for antibiotics: Penicillin, amoxicillin, Bactrim, Keflex. - Continue home CellCept   Chronic hernias: Evaluated by general surgery, no surgery is planned. No incarceration on CT or by exam.   Hypertension - Continue home valsartan   History of CVA, HLD: - Continue  home atorvastatin   Anxiety - Continue home Xanax, fluoxetine, trazodone   Asthma: Quiescent - Continue home medications.    Hypothyroidism - Continue home synthroid   History of diastolic CHF > Last echo in 2022 with EF 60 and 65% and  grade 1 diastolic dysfunction with normal RV function. - Not currently on any medication for this.  Consultants: IR Procedures performed: Paracentesis 11/9  Disposition: Home Diet recommendation:  Discharge Diet Orders (From admission, onward)     Start     Ordered   07/14/22 0000  Diet - low sodium heart healthy        07/14/22 1331          DISCHARGE MEDICATION: Allergies as of 07/14/2022       Reactions   Imuran [azathioprine] Anaphylaxis, Other (See Comments)   Kidneys shut down   Other Other (See Comments)   Fluoroquinolones - told to avoid due to Myasthenia Gravis.   Pentothal [thiopental] Anaphylaxis   Sodium pentothal   Aminoglycosides Other (See Comments)   Told to avoid due to Myasthenia gravis   Avelox [moxifloxacin] Other (See Comments)   Fluoroquinolones - told to avoid due to Myasthenia Gravis.   Beta Adrenergic Blockers Other (See Comments)   Told to avoid due to Myasthenia Gravis.   Calcium Channel Blockers Other (See Comments)   Told to avoid due to Myasthenia Gravis.   Cephalosporins    Can't take because of myasthenia gravis   Ciprofloxacin Other (See Comments)   Can't take because of myasthenia gravis Can't take because of myasthenia gravis   Clindamycin/lincomycin    May exascerbate myasthenia gravis   Colistin    Can't take because of myasthenia gravis   Fenofibrate    Other reaction(s): GI upset, myalgia   Fosamax [alendronate Sodium]    LEG WEAKNESS   Imuran [azathioprine Sodium] Other (See Comments)   Kidneys shut down   Iodinated Contrast Media Other (See Comments)   Contraindicated to Myasthenia Gravis   Iodine    Iodine contrast Can't take because of myasthenia gravis   Macrolides And Ketolides    Erythromocin, azithromycin, telithromycin Can't take because of myasthenia gravis   Magnesium-containing Compounds    Including milk of magnesia, antacids containing magnesium hydroxide (maalox, mylanta) and epsom salts Can't take  because of myasthenia gravis   Niacin And Related    Other reaction(s): leg aches   Norfloxacin Other (See Comments)   Can't take because of myasthenia gravis Can't take because of myasthenia gravis   Ofloxacin Other (See Comments)   Can't take because of myasthenia gravis Can't take because of myasthenia gravis   Pefloxacin Other (See Comments)   Can't take because of myasthenia gravis Can't take because of myasthenia gravis   Penicillamine    do not use due to Myasthenia Gravis   Prednisolone Nausea And Vomiting   Prednisone Nausea And Vomiting   Procainamide    Can't take because of myasthenia gravis   Quinidine    Can't take because of myasthenia gravis   Quinine Derivatives    Can't take because of myasthenia gravis   Succinylcholine    Can't take because of myasthenia gravis No paralytics   Tubocurarine    Can't take because of myasthenia gravis   Vecuronium    Can't take because of myasthenia gravis No paralytics   Vicodin [hydrocodone-acetaminophen] Other (See Comments)   Pt suffers from bad headaches        Medication List     STOP  taking these medications    enoxaparin 40 MG/0.4ML injection Commonly known as: LOVENOX   simethicone 80 MG chewable tablet Commonly known as: MYLICON       TAKE these medications    ALPRAZolam 0.5 MG tablet Commonly known as: XANAX Take 0.5 mg by mouth 4 (four) times daily as needed.   aspirin EC 81 MG tablet Take 1 tablet (81 mg total) by mouth daily. Swallow whole.   atorvastatin 20 MG tablet Commonly known as: LIPITOR Take 1 tablet (20 mg total) by mouth daily.   Centrum Silver tablet Take 1 tablet by mouth daily.   diclofenac Sodium 1 % Gel Commonly known as: VOLTAREN Apply 2 g topically every 6 (six) hours as needed (pain).   FLUoxetine 20 MG capsule Commonly known as: PROZAC Take 20 mg by mouth daily.   fluticasone 50 MCG/ACT nasal spray Commonly known as: FLONASE Place 2 sprays into both nostrils  daily.   levothyroxine 75 MCG tablet Commonly known as: SYNTHROID Take 75 mcg by mouth every morning. MUST TAKE BRAND NAME SYNTHROID   montelukast 10 MG tablet Commonly known as: SINGULAIR Take 10 mg by mouth at bedtime. MUST BE BRAND NAME ONLY-SINGULAIR   mycophenolate 500 MG tablet Commonly known as: CELLCEPT Take 1,000 mg by mouth 2 (two) times daily.   naproxen 500 MG tablet Commonly known as: NAPROSYN Take 500 mg by mouth 2 (two) times daily.   Omega 3 1200 MG Caps Take 1,200 mg by mouth daily.   ondansetron 4 MG tablet Commonly known as: ZOFRAN Take 1 tablet (4 mg total) by mouth every 8 (eight) hours as needed for nausea or vomiting.   oxyCODONE-acetaminophen 5-325 MG tablet Commonly known as: PERCOCET/ROXICET Take 1 tablet by mouth every 8 (eight) hours as needed for severe pain.   polyethylene glycol powder 17 GM/SCOOP powder Commonly known as: MiraLax Take 255 g by mouth daily as needed (constipation refractory to daily senna use).   senna 8.6 MG Tabs tablet Commonly known as: SENOKOT Take 2 tablets (17.2 mg total) by mouth at bedtime. Do note take if you are having loose stools or diarrhea   traZODone 150 MG tablet Commonly known as: DESYREL Take 75 mg by mouth at bedtime.   Trelegy Ellipta 200-62.5-25 MCG/ACT Aepb Generic drug: Fluticasone-Umeclidin-Vilant Inhale 1 puff into the lungs daily.   valsartan 160 MG tablet Commonly known as: Diovan Take 1 tablet (160 mg total) by mouth daily.   vitamin C 1000 MG tablet Take 1,000 mg by mouth every morning.   Vitamin D-3 25 MCG (1000 UT) Caps Take 1 capsule (1,000 Units total) by mouth every morning.        Follow-up Information     Ginger Organ., MD. Schedule an appointment as soon as possible for a visit.   Specialty: Internal Medicine Contact information: [place] [address] [phone]                Discharge Exam: Danley Danker Weights   07/13/22 1229  Weight:  81.6 kg  Older, very lively and pleasant female in no distress  RRR, no MRG or pitting leg edema Clear, nonlabored Abdomen is sift and nontender with hernia defects palpable but without active herniation unless valsalva, no erythema or tenderness. No fluid wave, +BS.  Condition at discharge: stable  The results of significant diagnostics from this hospitalization (including imaging, microbiology, ancillary and laboratory) are listed below for reference.   Imaging Studies: IR Paracentesis  Result Date: 07/13/2022  INDICATION: Patient presented with abdominal pain. Imaging concerning for peritoneal metastatic disease with large volume ascites. Interventional radiology asked to perform a diagnostic and therapeutic paracentesis. EXAM: ULTRASOUND GUIDED PARACENTESIS MEDICATIONS: 1% lidocaine 15 mL COMPLICATIONS: None immediate. PROCEDURE: Informed written consent was obtained from the patient after a discussion of the risks, benefits and alternatives to treatment. A timeout was performed prior to the initiation of the procedure. Initial ultrasound scanning demonstrates a large amount of ascites within the left lower abdominal quadrant. The left lower abdomen was prepped and draped in the usual sterile fashion. 1% lidocaine was used for local anesthesia. Following this, a 19 gauge, 7-cm, Yueh catheter was introduced. An ultrasound image was saved for documentation purposes. The paracentesis was performed. The catheter was removed and a dressing was applied. The patient tolerated the procedure well without immediate post procedural complication. FINDINGS: A total of approximately 3 L of clear yellow fluid was removed. Samples were sent to the laboratory as requested by the clinical team. IMPRESSION: Successful ultrasound-guided paracentesis yielding 3 liters of peritoneal fluid. Read by: Soyla Dryer, NP PLAN: Patient was scheduled as an outpatient for paracentesis as well as omental biopsy. She came to the  ED due to worsening abdominal distension and pain. We were not able to accommodate CT-guided omental biopsy today. She is being admitted. If cytology from paracentesis fluid provides conclusive diagnosis, omental biopsy may not be necessary. Please contact IR after cytology from paracentesis has been completed. Electronically Signed   By: Miachel Roux M.D.   On: 07/13/2022 16:44   CT ABDOMEN PELVIS WO CONTRAST  Result Date: 07/13/2022 CLINICAL DATA:  Abdominal pain * Tracking Code: BO * EXAM: CT ABDOMEN AND PELVIS WITHOUT CONTRAST TECHNIQUE: Multidetector CT imaging of the abdomen and pelvis was performed following the standard protocol without IV contrast. RADIATION DOSE REDUCTION: This exam was performed according to the departmental dose-optimization program which includes automated exposure control, adjustment of the mA and/or kV according to patient size and/or use of iterative reconstruction technique. COMPARISON:  06/25/2022 FINDINGS: Lower chest: No acute abnormality.  Coronary artery calcifications. Hepatobiliary: Coarse contour of the liver. No focal liver abnormality is seen. Status post cholecystectomy. No biliary dilatation. Pancreas: Unremarkable. No pancreatic ductal dilatation or surrounding inflammatory changes. Spleen: Normal in size without significant abnormality. Adrenals/Urinary Tract: Adrenal glands are unremarkable. Punctuate nonobstructive calculus of the lateral midportion of the right kidney (series 3, image 34). No left-sided calculi, ureteral calculi, or hydronephrosis. Bladder is unremarkable. Stomach/Bowel: Stomach is within normal limits. Appendix appears normal. No evidence of bowel wall thickening, distention, or inflammatory changes. Vascular/Lymphatic: Aortic atherosclerosis. No enlarged abdominal or pelvic lymph nodes. Reproductive: Status post hysterectomy. Other: Unchanged appearance of fat, fluid, and soft tissue stranding within a midline ventral epigastric hernia (series  3, image 23) and a ventral right hemiabdomen hernia (series 3, image 40). No evidence of bowel involvement. There is again seen extensive fat stranding and nodularity within the omentum, as well as within the low pelvis (series 3, image 33, 64). Large volume ascites throughout the abdomen and pelvis, slightly increased compared to prior examination. Musculoskeletal: No acute or significant osseous findings. IMPRESSION: 1. Large volume ascites throughout the abdomen and pelvis, slightly increased compared to prior examination. 2. Unchanged appearance of fat, fluid, and soft tissue stranding within a midline ventral epigastric hernia and a ventral right hemiabdomen hernia. No evidence of bowel involvement. 3. There is again seen extensive fat stranding and nodularity within the omentum, as well as within the low  pelvis. This appearance is highly concerning for peritoneal metastatic disease. 4. Coarse contour of the liver, suggestive of cirrhosis. 5. Nonobstructive right nephrolithiasis. 6. Coronary artery disease. Aortic Atherosclerosis (ICD10-I70.0). Electronically Signed   By: Delanna Ahmadi M.D.   On: 07/13/2022 13:23   CT ABDOMEN PELVIS WO CONTRAST  Result Date: 06/25/2022 CLINICAL DATA:  LLQ abdominal pain abdominal pain, hernias EXAM: CT ABDOMEN AND PELVIS WITHOUT CONTRAST TECHNIQUE: Multidetector CT imaging of the abdomen and pelvis was performed following the standard protocol without IV contrast. RADIATION DOSE REDUCTION: This exam was performed according to the departmental dose-optimization program which includes automated exposure control, adjustment of the mA and/or kV according to patient size and/or use of iterative reconstruction technique. COMPARISON:  CT abdomen pelvis 04/21/2022 FINDINGS: Lower chest: Bilateral lower lobe mild bronchiectasis and architectural distortion likely due to prior prior infection. Hepatobiliary: No focal liver abnormality. Status post cholecystectomy. No biliary  dilatation. Pancreas: Diffusely atrophic. No focal lesion. Otherwise normal pancreatic contour. No surrounding inflammatory changes. No main pancreatic ductal dilatation. Spleen: Normal in size without focal abnormality. Adrenals/Urinary Tract: No adrenal nodule bilaterally. Bilateral renal cortical scarring. No nephrolithiasis and no hydronephrosis. No definite contour-deforming renal mass. No ureterolithiasis or hydroureter. The urinary bladder is unremarkable. Stomach/Bowel: Stomach is within normal limits. No evidence of bowel wall thickening or dilatation. The appendix is not definitely identified with no inflammatory changes in the right lower quadrant to suggest acute appendicitis. Vascular/Lymphatic: No abdominal aorta or iliac aneurysm. Mild atherosclerotic plaque of the aorta and its branches. No abdominal, pelvic, or inguinal lymphadenopathy. Reproductive: Status post hysterectomy. No adnexal masses. Other: Redemonstration of superior anterior abdominal omental/peritoneal fat stranding and nodularity which extends into a moderate volume right spigelian hernia as well as a small supraumbilical Richter ventral wall hernia. The spigelian hernia as an abdominal defect of approximately 3.3 cm (2:38). The Richter ventral hernia demonstrates an abdominal defect of approximately 3.8 cm in contains a short segment of anti mesenteric transverse colon wall (2:23). Small volume simple free fluid ascites. No free intraperitoneal gas. No abscess formation. Musculoskeletal: No abdominal wall hernia or abnormality. No suspicious lytic or blastic osseous lesions. No acute displaced fracture. Grade 1 anterolisthesis of L4 on L5 status post bilateral posterolateral and interbody fusion surgical hardware. Multilevel degenerative changes of the spine. Partially visualized total right hip arthroplasty. Severe degenerative changes of the left hip. IMPRESSION: 1. Redemonstration of superior anterior abdominal peritoneal/omental  fat stranding and nodularity which extends into a moderate volume right spigelian hernia as well as a small supraumbilical Richter ventral wall hernia. Associated small volume free fluid within both hernias. Similar chronic appearance of the associated fat and intraperitoneal fat which may be due to fat necrosis/ischemia; however, underlying peritoneal carcinomatosis or infection cannot be fully excluded. Limited evaluation on this noncontrast study. 2. No associated bowel obstruction with the Richter hernia. 3. Small volume simple free fluid ascites. 4.  Aortic Atherosclerosis (ICD10-I70.0). Electronically Signed   By: Iven Finn M.D.   On: 06/25/2022 18:05    Microbiology: Results for orders placed or performed during the hospital encounter of 07/13/22  Culture, body fluid w Gram Stain-bottle     Status: None (Preliminary result)   Collection Time: 07/13/22  3:20 PM   Specimen: Peritoneal Washings  Result Value Ref Range Status   Specimen Description PERITONEAL  Final   Special Requests NONE  Final   Culture   Final    NO GROWTH < 24 HOURS Performed at [facility], [address] [place], [place] [address]    Report Status PENDING  Incomplete  Gram stain     Status: None   Collection Time: 07/13/22  3:20 PM   Specimen: Peritoneal Washings  Result Value Ref Range Status   Specimen Description PERITONEAL  Final   Special Requests NONE  Final   Gram Stain   Final    WBC PRESENT,BOTH PMN AND MONONUCLEAR NO ORGANISMS SEEN CYTOSPIN SMEAR Performed at [facility], [address], [place], [place] [address]    Report Status 07/13/2022 FINAL  Final    Labs: CBC: Recent Labs  Lab 07/13/22 1214 07/14/22 0155  WBC 16.4* 8.7  NEUTROABS 14.7*  --   HGB 13.3 12.0  HCT 41.7 36.7  MCV 87.8 87.6  PLT 431* 945   Basic Metabolic Panel: Recent Labs  Lab 07/13/22 1214 07/14/22 0155  NA 135 135  K 3.8 3.2*  CL 100 102  CO2 24 26  GLUCOSE 129* 116*  BUN 5* 7*   CREATININE 0.80 0.64  CALCIUM 8.9 8.4*   Liver Function Tests: Recent Labs  Lab 07/13/22 1214 07/14/22 0155  AST 22 19  ALT 14 13  ALKPHOS 93 79  BILITOT 1.0 1.1  PROT 5.5* 4.5*  ALBUMIN 3.1* 2.5*   CBG: No results for input(s): "GLUCAP" in the last 168 hours.  Discharge time spent: greater than 30 minutes.  Signed: Patrecia Pour, MD [facility] 07/14/2022

## 2022-07-14 NOTE — Progress Notes (Signed)
Discharge instructions (including medications) discussed with and copy provided to patient/caregiver 

## 2022-07-18 LAB — CULTURE, BODY FLUID W GRAM STAIN -BOTTLE: Culture: NO GROWTH

## 2022-07-19 ENCOUNTER — Encounter: Payer: Self-pay | Admitting: *Deleted

## 2022-07-19 NOTE — Progress Notes (Unsigned)
PATIENT NAVIGATOR PROGRESS NOTE  Name: Sabrina [name] Date: 07/19/2022 MRN: [id number]  DOB: Jan 22, 1941   Reason for visit:  Introductory phone call  Comments:  Called pt regarding referral from Dr Raul Del office.  New patient appt for 07/26/22 at 1:10 with Dr Benay Spice Reviewed directions to building and parking as well as one support person allowed in appt.  Verbalized understanding and gave contact information.     Time spent counseling/coordinating care: 30-45 minutes

## 2022-07-24 ENCOUNTER — Other Ambulatory Visit: Payer: Self-pay | Admitting: Internal Medicine

## 2022-07-24 DIAGNOSIS — Z8673 Personal history of transient ischemic attack (TIA), and cerebral infarction without residual deficits: Secondary | ICD-10-CM | POA: Diagnosis not present

## 2022-07-24 DIAGNOSIS — D849 Immunodeficiency, unspecified: Secondary | ICD-10-CM | POA: Diagnosis not present

## 2022-07-24 DIAGNOSIS — D72829 Elevated white blood cell count, unspecified: Secondary | ICD-10-CM | POA: Diagnosis not present

## 2022-07-24 DIAGNOSIS — R1084 Generalized abdominal pain: Secondary | ICD-10-CM | POA: Diagnosis not present

## 2022-07-24 DIAGNOSIS — E876 Hypokalemia: Secondary | ICD-10-CM | POA: Diagnosis not present

## 2022-07-24 DIAGNOSIS — R188 Other ascites: Secondary | ICD-10-CM | POA: Diagnosis not present

## 2022-07-24 DIAGNOSIS — G7000 Myasthenia gravis without (acute) exacerbation: Secondary | ICD-10-CM | POA: Diagnosis not present

## 2022-07-24 DIAGNOSIS — I10 Essential (primary) hypertension: Secondary | ICD-10-CM | POA: Diagnosis not present

## 2022-07-24 DIAGNOSIS — C799 Secondary malignant neoplasm of unspecified site: Secondary | ICD-10-CM | POA: Diagnosis not present

## 2022-07-24 DIAGNOSIS — K439 Ventral hernia without obstruction or gangrene: Secondary | ICD-10-CM | POA: Diagnosis not present

## 2022-07-24 DIAGNOSIS — E782 Mixed hyperlipidemia: Secondary | ICD-10-CM | POA: Diagnosis not present

## 2022-07-24 DIAGNOSIS — I5032 Chronic diastolic (congestive) heart failure: Secondary | ICD-10-CM | POA: Diagnosis not present

## 2022-07-25 ENCOUNTER — Ambulatory Visit (HOSPITAL_COMMUNITY)
Admission: RE | Admit: 2022-07-25 | Discharge: 2022-07-25 | Disposition: A | Discharge status: Home / Self Care | Payer: Medicare Other | Source: Ambulatory Visit | Attending: Internal Medicine | Admitting: Internal Medicine

## 2022-07-25 DIAGNOSIS — C799 Secondary malignant neoplasm of unspecified site: Secondary | ICD-10-CM | POA: Diagnosis not present

## 2022-07-25 DIAGNOSIS — R188 Other ascites: Secondary | ICD-10-CM | POA: Insufficient documentation

## 2022-07-25 DIAGNOSIS — C801 Malignant (primary) neoplasm, unspecified: Secondary | ICD-10-CM | POA: Diagnosis not present

## 2022-07-25 HISTORY — PX: IR PARACENTESIS: IMG2679

## 2022-07-25 MED ORDER — LIDOCAINE HCL 1 % IJ SOLN
INTRAMUSCULAR | Status: AC
  Administered 2022-07-25: 10 mL
  Filled 2022-07-25: qty 20

## 2022-07-25 NOTE — Procedures (Signed)
Ultrasound-guided diagnostic and therapeutic paracentesis performed yielding 1.3 liters of straw colored fluid.  . No immediate complications. EBL is none.

## 2022-07-26 ENCOUNTER — Encounter: Payer: Self-pay | Admitting: *Deleted

## 2022-07-26 ENCOUNTER — Encounter (INDEPENDENT_AMBULATORY_CARE_PROVIDER_SITE_OTHER): Payer: Medicare Other

## 2022-07-26 ENCOUNTER — Ambulatory Visit (HOSPITAL_COMMUNITY): Payer: Medicare Other

## 2022-07-26 ENCOUNTER — Other Ambulatory Visit: Payer: Self-pay | Admitting: *Deleted

## 2022-07-26 ENCOUNTER — Inpatient Hospital Stay: Payer: Medicare Other | Attending: Oncology | Admitting: Oncology

## 2022-07-26 ENCOUNTER — Encounter (HOSPITAL_COMMUNITY): Payer: Self-pay

## 2022-07-26 ENCOUNTER — Other Ambulatory Visit: Payer: Self-pay

## 2022-07-26 ENCOUNTER — Other Ambulatory Visit (HOSPITAL_BASED_OUTPATIENT_CLINIC_OR_DEPARTMENT_OTHER): Payer: Self-pay

## 2022-07-26 VITALS — BP 125/79 | HR 86 | Temp 98.2°F | Resp 18 | Ht 65.0 in | Wt 264.1 lb

## 2022-07-26 DIAGNOSIS — C801 Malignant (primary) neoplasm, unspecified: Secondary | ICD-10-CM | POA: Diagnosis not present

## 2022-07-26 DIAGNOSIS — R600 Localized edema: Secondary | ICD-10-CM

## 2022-07-26 DIAGNOSIS — G459 Transient cerebral ischemic attack, unspecified: Secondary | ICD-10-CM | POA: Diagnosis not present

## 2022-07-26 DIAGNOSIS — R1903 Right lower quadrant abdominal swelling, mass and lump: Secondary | ICD-10-CM

## 2022-07-26 MED ORDER — POTASSIUM CHLORIDE CRYS ER 10 MEQ PO TBCR
10.0000 meq | EXTENDED_RELEASE_TABLET | Freq: Two times a day (BID) | ORAL | 1 refills | Status: AC
  Filled 2022-07-26: qty 60, 30d supply, fill #0

## 2022-07-26 MED ORDER — HYDROMORPHONE HCL 2 MG PO TABS
1.0000 mg | ORAL_TABLET | ORAL | 0 refills | Status: DC | PRN
  Filled 2022-07-26: qty 30, 5d supply, fill #0

## 2022-07-26 MED ORDER — ONDANSETRON HCL 8 MG PO TABS
8.0000 mg | ORAL_TABLET | Freq: Three times a day (TID) | ORAL | 1 refills | Status: DC | PRN
  Filled 2022-07-26: qty 30, 10d supply, fill #0

## 2022-07-26 NOTE — Progress Notes (Signed)
[facility] New Patient Consult   Requesting MD: [address] [place],  [place] [address]   Sabrina [name] 81 y.o.  1941/08/18    Reason for Consult: Carcinomatosis   HPI: Sabrina [name] reports developing an increased right abdominal hernia in July.  She saw Dr. Brigitte Pulse and was referred for a CT of the abdomen and pelvis 04/21/2022.  A knuckle of mid transverse colon entered the abdominal hernia without evidence of obstruction.  The stomach appeared normal.  A right sided mid abdominal hernia contained fat and fluid.  Stranding extended into the subcutaneous soft tissue inferior to the hernia.  Small amount of ascites.  Increased attenuation of the mesentery/greater omentum in the upper central and right mid abdomen.  She was referred to Dr. Ninfa Linden.  She reports surgery was not recommended. She presented to the [facility] emergency room 06/25/2022 with increased abdominal distention and pain.  A CT abdomen/pelvis confirmed the superior anterior abdominal peritoneal/omental fat stranding and nodularity extending into a moderate volume right-sided hernia.  Surgery was consulted.  Her symptoms improved while hospitalized.  She was discharged home on 06/27/2022.  She again presented to the emergency room 07/13/2022 with abdominal pain.  A repeat CT revealed large volume ascites, unchanged soft tissue stranding in the midline.  Extensive fat stranding and nodularity is noted within the omentum and low pelvis concerning for peritoneal metastatic disease.  The liver contour was noted to be coarse suggestive of cirrhosis.  She underwent a paracentesis on 07/13/2022 for 3 L of clear fluid.  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  The tumor cells stained positive for cytokeratin 7 and CDX2.  Staining was negative for CK20, PAX8, and TTF-1.  She underwent a repeat palliative paracentesis for 1.3 L  of fluid on 07/25/2022.  Sabrina [name] is here with her daughter.  Past Medical History:  Diagnosis Date   Anxiety    Arthritis    Asthma    Avascular necrosis (HCC)    Bradycardia    symptomatic   Cancer (HCC)    COUPLE OF SKIN CANCERS   Carpal tunnel syndrome    LEFT --NUMBNESS   Cataracts, bilateral    Cerebral aneurysm    Complication of anesthesia    BP FALLS/"ANAPHYLACTIC SHOCK"/ EXTREME TREMORS;  DID  FINE WITH SURGERIES APRIL AND OCT 2013 AT [facility] SURGERIES WERE SHORT PROCEDURES--PROBLEMS WITH ANESTHESIA SEEM TO OCCUR WITH THE LONGER PROCEDURES.THE TREMORS WERE AFTER HIP REPLACEMNT 22011-ARMS WERE FLAILING--PT STATES IT TOOK 4 DOSES OF DEMEROL BEFORE THE TREMORS STOPPED.   Depression    History of skin cancer    HSV (herpes simplex virus) infection    CHEST   Hyperlipidemia    Hypertension    PT HAS LOST WEIGHT--B/P NOW WITHIN NORMALS--IS ON B/P MEDICINE   Hypertriglyceridemia    Hypothyroidism    Myasthenia gravis    DOING WELL ON CELLCEPT-FOLLOWED BY DR. LISA HOBSON - [facility] NEUROLOGIST   OA (osteoarthritis) of hip    Osteopenia    Osteoporosis    Pain    LEFT SHOULDER PAIN-TORN ROTATOR CUFF-VERY LIMITED ROM   Pain    TOP OF RIGHT SHOULDER--ROM OK AT PRESENT   Pneumonia    Rash, skin    LOWER ABDOMEN-FUNGAL INFECTION-PT HAS TOPICAL OINTMENT TO USE AS NEEDED.   Seasonal allergies    Thyroid disease    .  G2, P2   .  CHF  Past Surgical History:  Procedure Laterality Date   BELPHAROPTOSIS REPAIR  01/2006   PTOSIS REPAIR BILATERAL   CARPAL TUNNEL RELEASE  05/31/2012   Procedure: CARPAL TUNNEL RELEASE;  Surgeon: Mcarthur Rossetti, MD;  Location: [facility];  Service: Orthopedics;  Laterality: Left;  Left Open Carpal Tunnel Release   CARPAL TUNNEL RELEASE  10/10/2012   Procedure: CARPAL TUNNEL RELEASE;  Surgeon: Nita Sells, MD;  Location: [facility];  Service: Orthopedics;  Laterality: Left;   CEREBRAL ANEURYSM REPAIR  10/2005   GUGLIEMI COILS TO REPAIR  ANEURYSM; NO RESIDUAL PROBLEMS--HAS FOLLOW UP MRI EVERY 1 OR 2 YRS   CHOLECYSTECTOMY     EXTREME DROP IN BLOOD PRESSURE WITH THIS SURGERY- YRS AGO- PT WAS 81 YRS OLD   IR PARACENTESIS  07/13/2022   IR PARACENTESIS  07/25/2022   JOINT REPLACEMENT  10/19/09   RT HIP--SEVERE TREMORS, FLAILING OF ARMS AFTER THIS SURGERY.   left shoulder arthroscopy      REVERSE SHOULDER ARTHROPLASTY  10/10/2012   Procedure: REVERSE SHOULDER ARTHROPLASTY;  Surgeon: Nita Sells, MD;  Location: [facility];  Service: Orthopedics;  Laterality: Left;   TONSILLECTOMY AND ADENOIDECTOMY     TOTAL HIP ARTHROPLASTY     right   TUBAL LIGATION     ANAPHYLACTIC SHOCK AFTER THIS SURGERY-CAUSE THOUGHT TO  BE RELATED TO SODIUM PENTOTHAL    Medications: Reviewed  Allergies:  Allergies  Allergen Reactions   Imuran [Azathioprine] Anaphylaxis and Other (See Comments)    Kidneys shut down   Other Other (See Comments)    Fluoroquinolones - told to avoid due to Myasthenia Gravis.   Pentothal [Thiopental] Anaphylaxis    Sodium pentothal   Aminoglycosides Other (See Comments)    Told to avoid due to Myasthenia gravis   Anectine [Succinylcholine] Other (See Comments)    Told to avoid due to Myasthenia Gravis No paralytics   Avelox [Moxifloxacin] Other (See Comments)    Fluoroquinolones - told to avoid due to Myasthenia Gravis.   Beta Adrenergic Blockers Other (See Comments)    Told to avoid due to Myasthenia Gravis.   Calcium Channel Blockers Other (See Comments)    Told to avoid due to Myasthenia Gravis.   Cardioquin [Quinidine] Other (See Comments)    Told to avoid due to Myasthenia Gravis   Cephalosporins Other (See Comments)    Told to avoid due to Myasthenia Gravis   Cipro [Ciprofloxacin Hcl] Other (See Comments)    Told to avoid due to Myasthenia Gravis   Cleocin [Clindamycin] Other (See Comments)    Told to avoid due to Myasthenia Gravis   Colistin And Related Other (See Comments)    Told to avoid due to  Myasthenia Gravis   Depen [Penicillamine] Other (See Comments)    Told to avoid due to Myasthenia Gravis   Floxin Otic [Ofloxacin] Other (See Comments)    Told to avoid due to Myasthenia Gravis   Fosamax [Alendronate Sodium] Other (See Comments)    Leg weakness   Imuran [Azathioprine Sodium] Other (See Comments)    Kidneys shut down   Iodinated Contrast Media Other (See Comments)    Told to avoid due to Myasthenia Gravis   Iodine Other (See Comments)    Told to avoid due to Myasthenia Gravis   Macrolides And Ketolides Other (See Comments)    Told to avoid due to Myasthenia Gravis   Magnesium-Containing Compounds Other (See Comments)    Told to avoid due to Myasthenia Gravis   Niacin  And Related Other (See Comments)    Myalgias   Norcuron [Vecuronium] Other (See Comments)    Told to avoid due to Myasthenia Gravis No paralytics   Noroxin [Norfloxacin] Other (See Comments)    Told to avoid due to Myasthenia Gravis   Prednisolone Nausea And Vomiting   Prednisone Nausea And Vomiting   Pronestyl [Procainamide] Other (See Comments)    Told to avoid due to Myasthenia Gravis   Quinine Derivatives Other (See Comments)    Told to avoid due to Myasthenia Gravis   Tricor [Fenofibrate] Other (See Comments)    Myalgias GI upset   Tubocurarine Other (See Comments)    Told to avoid due to Myasthenia Gravis   Vicodin [Hydrocodone-Acetaminophen] Other (See Comments)    Headaches     Family history: No family history of cancer  Social History:   She lives with her daughter in [place].  She is retired from [employer].  She does not use cigarettes or alcohol.  She reports having 1 drink per night in the past.  No transfusion history.  No risk factor for HIV or hepatitis.  ROS:   Positives include: Constant nausea, intermittent dry heaves, early satiety, liquid stool with "thick brown paste "and clear mucus.  Incontinent of stool, left arm pain and erythema beginning 07/22/2022  A  complete ROS was otherwise negative.  Physical Exam:  Blood pressure 125/79, pulse 86, temperature 98.2 F (36.8 C), temperature source Oral, resp. rate 18, height '5\' 5"'$  (1.651 m), weight 264 lb 1.6 oz (119.8 kg), SpO2 99 %.  HEENT: Oropharynx without visible mass, neck without mass Lungs: Distant breath sounds, clear bilaterally Cardiac: Regular rate and rhythm Abdomen: Distended, no hepatosplenomegaly, firm masslike fullness at the right mid lateral abdomen  Vascular: The left arm is swollen with venous engorgement.  There is erythema at the left forearm with a palpable cord at the upper aspect of the medial left forearm Lymph nodes: No cervical, supraclavicular, axillary, or inguinal nodes Neurologic: Alert and oriented, the motor exam appears grossly intact bilaterally.  She ambulated to the exam table without difficulty Skin: Erythema at the left forearm Breast: Bilateral breast without mass Musculoskeletal: No spine tenderness   LAB:  CBC  Lab Results  Component Value Date   WBC 8.7 07/14/2022   HGB 12.0 07/14/2022   HCT 36.7 07/14/2022   MCV 87.6 07/14/2022   PLT 311 07/14/2022   NEUTROABS 14.7 (H) 07/13/2022        CMP  Lab Results  Component Value Date   NA 135 07/14/2022   K 3.2 (L) 07/14/2022   CL 102 07/14/2022   CO2 26 07/14/2022   GLUCOSE 116 (H) 07/14/2022   BUN 7 (L) 07/14/2022   CREATININE 0.64 07/14/2022   CALCIUM 8.4 (L) 07/14/2022   PROT 4.5 (L) 07/14/2022   ALBUMIN 2.5 (L) 07/14/2022   AST 19 07/14/2022   ALT 13 07/14/2022   ALKPHOS 79 07/14/2022   BILITOT 1.1 07/14/2022   GFRNONAA >60 07/14/2022   GFRAA >60 07/28/2015     No results found for: "CEA1", "OJJ009", "CA125"  Imaging: As per HPI, CT images from 07/13/2022 reviewed with Sabrina [name] and her daughter  IR Paracentesis  Result Date: 07/25/2022 INDICATION: Patient with history of adenocarcinoma with recurrent ascites. Request is for therapeutic paracentesis EXAM: ULTRASOUND  GUIDED THERAPEUTIC PARACENTESIS MEDICATIONS: Lidocaine 1% 10 mL COMPLICATIONS: None immediate. PROCEDURE: Informed written consent was obtained from the patient after a discussion of the risks, benefits and alternatives to  treatment. A timeout was performed prior to the initiation of the procedure. Initial ultrasound scanning demonstrates a small amount of ascites within the left lower abdominal quadrant. The left lower abdomen was prepped and draped in the usual sterile fashion. 1% lidocaine was used for local anesthesia. Following this, a 19 gauge, 7-cm, Yueh catheter was introduced. An ultrasound image was saved for documentation purposes. The paracentesis was performed. The catheter was removed and a dressing was applied. The patient tolerated the procedure well without immediate post procedural complication. FINDINGS: A total of approximately 1.3 L of straw-colored fluid was removed. IMPRESSION: Successful ultrasound-guided paracentesis yielding 1.3 liters of peritoneal fluid. Read by: Rushie Nyhan, NP Electronically Signed   By: Sandi Mariscal M.D.   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, CK7 and CDX2 positive CT abdomen/pelvis 04/22/2019 23-2 anterior abdominal wall hernias, no bowel enters the larger hernia with fluid and inflammation in the herniated fat, small volume ascites, increased attenuation in the mesentery/omentum CT abdomen/pelvis 06/25/2021-superior anterior abdominal peritoneal/omental fat stranding and nodularity extending into a moderate volume right spigelian hernia, small supraumbilical ventral wall hernia, small volume ascites, no bowel obstruction Abdomen/pelvis 07/13/2022-large volume ascites, unchanged appearance of fat, fluid, soft tissue stranding and a midline hernia and right hemiabdomen hernia, extensive stranding and nodularity in the omentum concerning for new metastatic disease, coarse liver contour suggestive of  cirrhosis Nausea, early satiety, and pain secondary to #1 Myasthenia gravis-maintained on CellCept 4.   Asthma 5.   TIA August 2022 6.   "CHF " 7.   Pain, erythema, and palpable cord at the left arm 07/26/2022 8.   Multiple drug allergies    Disposition:   Sabrina [name] is referred for oncology evaluation after being diagnosed with adenocarcinoma involving ascitic fluid.  CT staging reveals evidence of extensive abdominal carcinomatosis.  No primary tumor site has been identified.  The immunohistochemical profile on the tumor cells is consistent with an upper GI or pancreaticobiliary primary.  I discussed the differential diagnosis and reviewed the imaging studies with Sabrina [name] and her daughter.  She has metastatic disease and no therapy will be curative.  The goal of treatment is to palliate her symptoms and prolong survival.  We will refer her to gastroenterology as a gastroesophageal or gastric primary are high in the differential diagnosis.  A small bowel tumor is also possible.  She may have a primary tumor or an area of carcinomatosis in the right abdominal hernia.  She will use hydromorphone as needed for pain.  She has a palpable cord, erythema, and tenderness at the left forearm today.  I suspect she has superficial thrombophlebitis related to a previous IV or venous stick.  We will refer her for a Doppler of the left upper extremities to rule out a deep vein thrombosis.  Sabrina [name] will return for an office visit and further discussion on 08/07/2022.  We will refer her for a biopsy to obtain tissue for molecular testing if the upper endoscopy is negative.  Betsy Coder, MD  07/26/2022, 3:07 PM

## 2022-07-26 NOTE — Progress Notes (Signed)
PATIENT NAVIGATOR PROGRESS NOTE  Name: Sabrina [name] Date: 07/26/2022 MRN: [id number]  DOB: 22-Nov-1940   Reason for visit:  Initial visit with Dr Benay Spice  Comments:  Met with [name] and her daughter [name] Referral to [facility] for endoscopy for further evaluation of site of origin for Adenocarcinoma Referral to CV clinic for doppler of left upper arm to evaluate for DVT Prescriptions for dilaudid, zofran and KCL to be escribed into [facility] at [facility] return to clinic 12/4 for F/U visit Will call if she desires further paracentesis for comfort Nutrition information for soft diet and low residue diet given as well as referral to nutrition Referral to SW   Time spent counseling/coordinating care: > 60 minutes

## 2022-07-28 ENCOUNTER — Inpatient Hospital Stay: Payer: Medicare Other

## 2022-07-28 NOTE — Progress Notes (Signed)
[facility] CSW Progress Note  Clinical Education officer, museum contacted caregiver by phone to conduct initial assessment. Spoke with patient's daughter, Vickii Chafe.  She stated patient was unavailable.  Arranged for CSW to call patient next week.  [name] expressed her appreciation for Dr. Benay Spice.  She expressed no other needs at this time.    Rodman Pickle Dahlton Hinde, LCSW

## 2022-07-31 ENCOUNTER — Telehealth: Payer: Self-pay | Admitting: Gastroenterology

## 2022-07-31 ENCOUNTER — Other Ambulatory Visit: Payer: Self-pay

## 2022-07-31 ENCOUNTER — Inpatient Hospital Stay: Payer: Medicare Other

## 2022-07-31 DIAGNOSIS — R1909 Other intra-abdominal and pelvic swelling, mass and lump: Secondary | ICD-10-CM

## 2022-07-31 DIAGNOSIS — C801 Malignant (primary) neoplasm, unspecified: Secondary | ICD-10-CM

## 2022-07-31 NOTE — Telephone Encounter (Signed)
Returned call to [employer]. I advised that patient would need office visit. I scheduled her for an appt on Thursday, 08/03/22 at 3:20 pm  with you. I told Sabrina [name] that I doubt that you would do a direct EGD but I would ask to be sure. Dr. Benay Spice is trying to rule out GI as primary source of adenocarcinoma. You have an available appt tomorrow morning if you want to schedule direct. Thank you

## 2022-07-31 NOTE — Telephone Encounter (Signed)
Patient's daughter is calling in regards to a referral sent over by the cancer center, they are wanting to see Dr Sabrina [name] but she says the Dr at the cancer cent[facility] stated she needed to be seen for her abdominal mass so she would like to speak with a nurse. Please advise

## 2022-07-31 NOTE — Telephone Encounter (Signed)
Great, thank you [name] 

## 2022-07-31 NOTE — Telephone Encounter (Signed)
Per Dr. Havery Moros - keep office visit as scheduled. Add patient on for EGD at [facility] on 08/07/22 at 8:45 am. Pt to arrive at 7:15 am with care partner. I called and spoke with [name] regarding appt information. Vickii Chafe is aware that they will receive written EGD instructions when patient comes in on Thursday for her office visit. [name] wanted to know if we could order a paracentesis. I told her we do order them but patient has not been seen by our office. Vickii Chafe states that she will contact pt's PCP for order because she does not know if patient can wait that long for a paracentesis appt. She is not currently taking any diuretics. [name] verbalized understanding of all information and had no concerns at the end of the call.  Ambulatory referral to GI in epic. EGD instructions in epic, pt will receive at her upcoming office visit.

## 2022-07-31 NOTE — Addendum Note (Signed)
Addended by: Yevette Edwards on: 07/31/2022 04:38 PM   Modules accepted: Orders

## 2022-07-31 NOTE — Progress Notes (Signed)
[facility] Work  Initial Assessment   HULDA REDDIX is a 81 y.o. year old female.  CSW contacted caregiver by phone. Clinical Social Work was referred by nurse navigator for assessment of psychosocial needs.   SDOH (Social Determinants of Health) assessments performed: Yes SDOH Interventions    Flowsheet Row Clinical Support from 07/31/2022 in [facility]  SDOH Interventions   Financial Strain Interventions Financial Counselor       SDOH Screenings   Food Insecurity: Food Insecurity Present (06/26/2022)  Housing: Low Risk  (06/26/2022)  Transportation Needs: No Transportation Needs (06/26/2022)  Utilities: Not At Risk (06/26/2022)  Financial Resource Strain: Medium Risk (07/31/2022)  Tobacco Use: Low Risk  (07/25/2022)     Distress Screen completed: No     No data to display            Family/Social Information:  Housing Arrangement: patient lives with her daughter, Vickii Chafe.   They have shared the same home for six years. Family members/support persons in your life? Family, Friends, and Education administrator.  Patient is originally from [place].  She regularly talks to four of her friends, but several have died.  Patient has a grandson and wife, and two great grandchildren who are supportive. Transportation concerns: no  Employment: Retired  Income source: Conservation officer, historic buildings and Supported by [employer] and [employer] Financial concerns: Yes, current concerns Type of concern: Medical bills and Care giving (Child care or elder care services) Food access concerns: no Religious or spiritual practice: Yes-Patient previously belonged to a church. Services Currently in place:  Medicare and AARP.  Coping/ Adjustment to diagnosis: Patient understands treatment plan and what happens next? yes Concerns about diagnosis and/or treatment: How I will pay for the services I need Patient reported stressors: Finances Hopes and/or priorities: To have a good  quality of life. Patient enjoys time with family/ friends Current coping skills/ strengths: General fund of knowledge  and Supportive family/friends     SUMMARY: Current SDOH Barriers:  Financial constraints related to patient care.  Clinical Social Work Clinical Goal(s):  Explore community resource options for unmet needs related to:  Financial Strain   Interventions: Discussed common feeling and emotions when being diagnosed with cancer, and the importance of support during treatment Informed patient of the support team roles and support services at [facility] Provided CSW contact information and encouraged patient to call with any questions or concerns Referred patient to Dr. Gearldine Shown office for a prescription for DME.  Daughter said patient needed a w/c and electric bed.  Also mailed [employer] and [place] literature.  Made referrals to Dory Peru, Dietician, regarding daughter's concern regarding patient's nutrition.  Also made a referral for the [employer].  Informed [name] of [facility].   Referred her to [employer] [facility] for care giving in her home.   Follow Up Plan: Patient will contact CSW with any support or resource needs Patient verbalizes understanding of plan: Yes    Rodman Pickle Nyema Hachey, LCSW

## 2022-08-01 ENCOUNTER — Other Ambulatory Visit (HOSPITAL_COMMUNITY): Payer: Self-pay | Admitting: Internal Medicine

## 2022-08-01 ENCOUNTER — Other Ambulatory Visit: Payer: Self-pay | Admitting: *Deleted

## 2022-08-01 ENCOUNTER — Encounter: Payer: Self-pay | Admitting: *Deleted

## 2022-08-01 DIAGNOSIS — C801 Malignant (primary) neoplasm, unspecified: Secondary | ICD-10-CM

## 2022-08-01 DIAGNOSIS — R188 Other ascites: Secondary | ICD-10-CM

## 2022-08-01 NOTE — Progress Notes (Signed)
Order placed for wheelchair via [facility] and call to [employer] at [phone]

## 2022-08-02 ENCOUNTER — Other Ambulatory Visit: Payer: Self-pay

## 2022-08-02 ENCOUNTER — Ambulatory Visit (HOSPITAL_COMMUNITY)
Admission: RE | Admit: 2022-08-02 | Discharge: 2022-08-02 | Disposition: A | Discharge status: Home / Self Care | Payer: Medicare Other | Source: Ambulatory Visit | Attending: Internal Medicine | Admitting: Internal Medicine

## 2022-08-02 ENCOUNTER — Encounter (HOSPITAL_COMMUNITY): Payer: Self-pay | Admitting: Gastroenterology

## 2022-08-02 DIAGNOSIS — R188 Other ascites: Secondary | ICD-10-CM | POA: Diagnosis not present

## 2022-08-02 DIAGNOSIS — C801 Malignant (primary) neoplasm, unspecified: Secondary | ICD-10-CM | POA: Diagnosis not present

## 2022-08-02 HISTORY — PX: IR PARACENTESIS: IMG2679

## 2022-08-02 MED ORDER — LIDOCAINE HCL 1 % IJ SOLN
INTRAMUSCULAR | Status: AC
  Filled 2022-08-02: qty 20

## 2022-08-02 NOTE — Procedures (Signed)
PROCEDURE SUMMARY:  Successful US guided paracentesis from left lateral abdomen.  Yielded 4.6 liters of clear yellow fluid.  No immediate complications.  Patient tolerated well.  EBL = trace  Quaniyah Bugh S Reigan Tolliver PA-C 08/02/2022 3:02 PM

## 2022-08-02 NOTE — Telephone Encounter (Signed)
Instructions printed to have for appt

## 2022-08-02 NOTE — Progress Notes (Signed)
The proposed treatment discussed in conference is for discussion purpose only and is not a binding recommendation.  The patients have not been physically examined, or presented with their treatment options.  Therefore, final treatment plans cannot be decided.  

## 2022-08-02 NOTE — Telephone Encounter (Signed)
Close encounter 

## 2022-08-03 ENCOUNTER — Ambulatory Visit (INDEPENDENT_AMBULATORY_CARE_PROVIDER_SITE_OTHER): Payer: Medicare Other | Admitting: Gastroenterology

## 2022-08-03 ENCOUNTER — Encounter: Payer: Self-pay | Admitting: Gastroenterology

## 2022-08-03 VITALS — BP 120/60 | HR 88 | Ht 65.0 in

## 2022-08-03 DIAGNOSIS — R110 Nausea: Secondary | ICD-10-CM | POA: Diagnosis not present

## 2022-08-03 DIAGNOSIS — K7460 Unspecified cirrhosis of liver: Secondary | ICD-10-CM | POA: Diagnosis not present

## 2022-08-03 DIAGNOSIS — C801 Malignant (primary) neoplasm, unspecified: Secondary | ICD-10-CM | POA: Diagnosis not present

## 2022-08-03 MED ORDER — OMEPRAZOLE 20 MG PO CPDR: 20.0000 mg | DELAYED_RELEASE_CAPSULE | Freq: Every day | ORAL | 3 refills | Status: AC

## 2022-08-03 NOTE — Patient Instructions (Addendum)
If you are age 82 or older, your body mass index should be between 23-30. Your Body mass index is 30.64 kg/m. If this is out of the aforementioned range listed, please consider follow up with your Primary Care Provider.  If you are age 86 or younger, your body mass index should be between 19-25. Your Body mass index is 30.64 kg/m. If this is out of the aformentioned range listed, please consider follow up with your Primary Care Provider.   ________________________________________________________   Sabrina [name] have been scheduled an upper endoscopy. Please follow written instructions given to you at your visit today.  Please pick up your prep supplies at the pharmacy within the next 1-3 days. If you use inhalers (even only as needed), please bring them with you on the day of your procedure.   We have sent the following medications to your pharmacy for you to pick up at your convenience: Omeprazole 20 mg: Take once daily  Please purchase the following medications over the counter and take as directed: Miralax: Take once daily  Thank you for entrusting me with your care and for choosing [facility], Dr. Walsh Cellar

## 2022-08-03 NOTE — Progress Notes (Signed)
HPI :  81 year old female with a history of adenocarcinoma with peritoneal involvement/ascites, of unknown primary, newly diagnosed cirrhosis, myasthenia gravis, hypertension, hyperlipidemia, here to be evaluated for her malignancy and consideration for endoscopic evaluation.  Patient is accompanied by her daughter today who helps provide history.  Patient has had large ventral hernias ongoing for many years.  She has never had intervention on these as they have not cause pain or cause obstructive symptoms.  Over the past 3 to have months the daughter reports a progressive decline in her health.  Loss of energy, worsening abdominal discomfort with development of ascites.  She states the hernia in the right upper quadrant has become "hard".  Back in August she had a CT scan abdomen pelvis.  She was noted to have hernias without evidence of obstruction in epigastric and right upper quadrant area.  There was a small amount of ascites at the time and increased attenuation in the mesentery/greater omentum.  She was seen by Dr. Ninfa Linden of surgery and surgery was not recommended at that time.  Unfortunately in October 22nd she represented to [facility] long ED with worsening abdominal distention and abdominal pain.  A repeat CT scan was performed showing abdominal peritoneal/omental fat stranding and nodularity extending into moderate volume right-sided hernia.  Surgery again consulted, she was discharged without operative intervention.  Presented again to the ED on November 9 with worsening abdominal pain and distention.  This time CT scan reveals large volume ascites, extensive fat stranding and nodularity noting in the omentum and pelvis concerning for peritoneal metastatic disease.  Also noted to have a cirrhotic liver which was new on imaging.  She underwent paracentesis at that time with 3 L of clear fluid removed.  Unfortunately cytology revealed adenocarcinoma consistent with an upper GI or  pancreaticobiliary primary tumor.    Since that time daughter states the patient's been struggling.  She has had recurrent ascites that has come back and she has undergone multiple paracentesis to keep her comfortable.  She just underwent 4.6 L paracentesis yesterday.  She has been placed on Dilaudid as needed for abdominal pain, she states her abdomen hurts rather diffusely from the lung I could see in her hernias.  She has chronic nausea since starting Dilaudid however she is not vomiting able to keep food down although she does not have much of an appetite.  She has occasional reflux but not significant.  Zofran does not seem to be helping her nausea.  Her bowels have been erratic.  She states she did not have a bowel movement for roughly 8 days and then had several bowel movements of loose watery stools.  She has MiraLAX at home but is not really taking it.  She states for the past several weeks her bowels have been very irregular.  She denies any blood in her stools.  In regards to her cirrhosis she is not had any decompensating events we can tell.  Her platelets are normal, INR 1.2, her liver function testing is normal.  She denies any history of significant alcohol use.  She denies any history of liver disease in the family.  This is a new diagnosis for her.  Patient is currently scheduled for an endoscopy with me at the hospital which was coordinated in an expedited manner to help clarify her diagnosis.  We discussed what this entailed and plans moving forward.  Daughter and patient want to have an endoscopy done to help clarify primary site of tumor which could  influence her therapy.  Patient is in a wheelchair today, she is very weak and has been using this for her medical appointments.  Patient has never had an endoscopy or colonoscopy before.   Echo 04/11/2021: EF 60-65%, grade I DD  CT abdomen / pelvis 07/13/22: IMPRESSION: 1. Large volume ascites throughout the abdomen and pelvis,  slightly increased compared to prior examination. 2. Unchanged appearance of fat, fluid, and soft tissue stranding within a midline ventral epigastric hernia and a ventral right hemiabdomen hernia. No evidence of bowel involvement. 3. There is again seen extensive fat stranding and nodularity within the omentum, as well as within the low pelvis. This appearance is highly concerning for peritoneal metastatic disease. 4. Coarse contour of the liver, suggestive of cirrhosis. 5. Nonobstructive right nephrolithiasis. 6. Coronary artery disease.  CT abdomen / pelvis 06/25/22: IMPRESSION: 1. Redemonstration of superior anterior abdominal peritoneal/omental fat stranding and nodularity which extends into a moderate volume right spigelian hernia as well as a small supraumbilical Richter ventral wall hernia. Associated small volume free fluid within both hernias. Similar chronic appearance of the associated fat and intraperitoneal fat which may be due to fat necrosis/ischemia; however, underlying peritoneal carcinomatosis or infection cannot be fully excluded. Limited evaluation on this noncontrast study. 2. No associated bowel obstruction with the Richter hernia. 3. Small volume simple free fluid ascites. 4.  Aortic Atherosclerosis (ICD10-I70.0).    Past Medical History:  Diagnosis Date   Anxiety    Arthritis    Asthma    Avascular necrosis (HCC)    Bradycardia    symptomatic   Cancer (HCC)    COUPLE OF SKIN CANCERS   Carpal tunnel syndrome    LEFT --NUMBNESS   Cataracts, bilateral    Cerebral aneurysm    Complication of anesthesia    BP FALLS/"ANAPHYLACTIC SHOCK"/ EXTREME TREMORS;  DID  FINE WITH SURGERIES APRIL AND OCT 2013 AT [facility] SURGERIES WERE SHORT PROCEDURES--PROBLEMS WITH ANESTHESIA SEEM TO OCCUR WITH THE LONGER PROCEDURES.THE TREMORS WERE AFTER HIP REPLACEMNT 22011-ARMS WERE FLAILING--PT STATES IT TOOK 4 DOSES OF DEMEROL BEFORE THE TREMORS STOPPED.   Depression     History of skin cancer    HSV (herpes simplex virus) infection    CHEST   Hyperlipidemia    Hypertension    PT HAS LOST WEIGHT--B/P NOW WITHIN NORMALS--IS ON B/P MEDICINE   Hypertriglyceridemia    Hypothyroidism    Myasthenia gravis    DOING WELL ON CELLCEPT-FOLLOWED BY DR. LISA HOBSON - [facility] NEUROLOGIST   OA (osteoarthritis) of hip    Osteopenia    Osteoporosis    Pain    LEFT SHOULDER PAIN-TORN ROTATOR CUFF-VERY LIMITED ROM   Pain    TOP OF RIGHT SHOULDER--ROM OK AT PRESENT   Pneumonia    Rash, skin    LOWER ABDOMEN-FUNGAL INFECTION-PT HAS TOPICAL OINTMENT TO USE AS NEEDED.   Seasonal allergies    Thyroid disease      Past Surgical History:  Procedure Laterality Date   BELPHAROPTOSIS REPAIR  01/2006   PTOSIS REPAIR BILATERAL   CARPAL TUNNEL RELEASE  05/31/2012   Procedure: CARPAL TUNNEL RELEASE;  Surgeon: Mcarthur Rossetti, MD;  Location: [facility];  Service: Orthopedics;  Laterality: Left;  Left Open Carpal Tunnel Release   CARPAL TUNNEL RELEASE  10/10/2012   Procedure: CARPAL TUNNEL RELEASE;  Surgeon: Nita Sells, MD;  Location: [facility];  Service: Orthopedics;  Laterality: Left;   CEREBRAL ANEURYSM REPAIR  10/2005   GUGLIEMI COILS TO REPAIR ANEURYSM;  NO RESIDUAL PROBLEMS--HAS FOLLOW UP MRI EVERY 1 OR 2 YRS   CHOLECYSTECTOMY     EXTREME DROP IN BLOOD PRESSURE WITH THIS SURGERY- YRS AGO- PT WAS 81 YRS OLD   IR PARACENTESIS  07/13/2022   IR PARACENTESIS  07/25/2022   IR PARACENTESIS  08/02/2022   JOINT REPLACEMENT  10/19/09   RT HIP--SEVERE TREMORS, FLAILING OF ARMS AFTER THIS SURGERY.   left shoulder arthroscopy      REVERSE SHOULDER ARTHROPLASTY  10/10/2012   Procedure: REVERSE SHOULDER ARTHROPLASTY;  Surgeon: Nita Sells, MD;  Location: [facility];  Service: Orthopedics;  Laterality: Left;   TONSILLECTOMY AND ADENOIDECTOMY     TOTAL HIP ARTHROPLASTY     right   TUBAL LIGATION     ANAPHYLACTIC SHOCK AFTER THIS SURGERY-CAUSE THOUGHT TO  BE RELATED TO  SODIUM PENTOTHAL   Family History  Problem Relation Age of Onset   Hypertension Mother    Heart disease Mother        heart attack   Stroke Father    Heart disease Father        heart attack   Breast cancer Neg Hx    Social History   Tobacco Use   Smoking status: Never   Smokeless tobacco: Never  Vaping Use   Vaping Use: Never used  Substance Use Topics   Alcohol use: Not Currently    Comment: OCCASIONAL   Drug use: No   Current Outpatient Medications  Medication Sig Dispense Refill   albuterol (VENTOLIN HFA) 108 (90 Base) MCG/ACT inhaler Inhale 1-2 puffs into the lungs every 6 (six) hours as needed for wheezing or shortness of breath.     ALPRAZolam (XANAX) 0.5 MG tablet Take 0.5 mg by mouth 4 (four) times daily.     amoxicillin (AMOXIL) 500 MG capsule Take 2,000 mg by mouth See admin instructions. Take 2000 mg 1 hour prior to dental work     Ascorbic Acid (VITAMIN C) 1000 MG tablet Take 1,000 mg by mouth every morning.      aspirin EC 325 MG tablet Take 325 mg by mouth daily.     aspirin EC 81 MG EC tablet Take 1 tablet (81 mg total) by mouth daily. Swallow whole. 30 tablet 11   atorvastatin (LIPITOR) 20 MG tablet Take 1 tablet (20 mg total) by mouth daily.     bismuth subsalicylate (PEPTO BISMOL) 262 MG/15ML suspension Take 30 mLs by mouth every 6 (six) hours as needed for indigestion.     Cholecalciferol (VITAMIN D-3) 25 MCG (1000 UT) CAPS Take 1 capsule (1,000 Units total) by mouth every morning.     diclofenac Sodium (VOLTAREN) 1 % GEL Apply 2 g topically every 6 (six) hours as needed (pain).     FLUoxetine (PROZAC) 20 MG capsule Take 20 mg by mouth daily.     fluticasone (FLONASE) 50 MCG/ACT nasal spray Place 2 sprays into both nostrils daily.  8   HYDROmorphone (DILAUDID) 2 MG tablet Take 0.5-1 tablets (1-2 mg total) by mouth every 4 (four) hours as needed for severe pain. 30 tablet 0   levothyroxine (SYNTHROID, LEVOTHROID) 75 MCG tablet Take 75 mcg by mouth every  morning. MUST TAKE BRAND NAME SYNTHROID     montelukast (SINGULAIR) 10 MG tablet Take 10 mg by mouth at bedtime. MUST BE BRAND NAME ONLY-SINGULAIR     Multiple Vitamins-Minerals (CENTRUM SILVER) tablet Take 1 tablet by mouth daily.     mycophenolate (CELLCEPT) 500 MG tablet Take 1,000 mg by  mouth 2 (two) times daily.      Omega 3 1200 MG CAPS Take 1,200 mg by mouth daily.     omeprazole (PRILOSEC) 20 MG capsule Take 1 capsule (20 mg total) by mouth daily. 90 capsule 3   ondansetron (ZOFRAN) 8 MG tablet Take 1 tablet (8 mg total) by mouth every 8 (eight) hours as needed for nausea or vomiting. 30 tablet 1   polyethylene glycol powder (MIRALAX) 17 GM/SCOOP powder Take 255 g by mouth daily as needed (constipation refractory to daily senna use). 255 g 0   potassium chloride (KLOR-CON M) 10 MEQ tablet Take 1 tablet (10 mEq total) by mouth 2 (two) times daily. 60 tablet 1   senna (SENOKOT) 8.6 MG TABS tablet Take 2 tablets (17.2 mg total) by mouth at bedtime. Do note take if you are having loose stools or diarrhea (Patient taking differently: Take 1 tablet by mouth daily as needed for mild constipation.) 60 tablet 1   traZODone (DESYREL) 150 MG tablet Take 75 mg by mouth at bedtime.      TRELEGY ELLIPTA 200-62.5-25 MCG/INH AEPB Inhale 1 puff into the lungs daily.     valsartan (DIOVAN) 160 MG tablet Take 1 tablet (160 mg total) by mouth daily. 30 tablet 2   No current facility-administered medications for this visit.   Allergies  Allergen Reactions   Imuran [Azathioprine] Anaphylaxis and Other (See Comments)    Kidneys shut down   Other Other (See Comments)    Fluoroquinolones - told to avoid due to Myasthenia Gravis.   Pentothal [Thiopental] Anaphylaxis    Sodium pentothal   Aminoglycosides Other (See Comments)    Told to avoid due to Myasthenia gravis   Anectine [Succinylcholine] Other (See Comments)    Told to avoid due to Myasthenia Gravis No paralytics   Avelox [Moxifloxacin] Other (See  Comments)    Fluoroquinolones - told to avoid due to Myasthenia Gravis.   Beta Adrenergic Blockers Other (See Comments)    Told to avoid due to Myasthenia Gravis.   Betamethasone     Flushed, hyper, too much to handle    Calcium Channel Blockers Other (See Comments)    Told to avoid due to Myasthenia Gravis.   Cardioquin [Quinidine] Other (See Comments)    Told to avoid due to Myasthenia Gravis   Cephalosporins Other (See Comments)    Told to avoid due to Myasthenia Gravis   Cipro [Ciprofloxacin Hcl] Other (See Comments)    Told to avoid due to Myasthenia Gravis   Cleocin [Clindamycin] Other (See Comments)    Told to avoid due to Myasthenia Gravis   Colistin And Related Other (See Comments)    Told to avoid due to Myasthenia Gravis   Depen [Penicillamine] Other (See Comments)    Told to avoid due to Myasthenia Gravis   Floxin Otic [Ofloxacin] Other (See Comments)    Told to avoid due to Myasthenia Gravis   Fosamax [Alendronate Sodium] Other (See Comments)    Leg weakness   Imuran [Azathioprine Sodium] Other (See Comments)    Kidneys shut down   Iodinated Contrast Media Other (See Comments)    Told to avoid due to Myasthenia Gravis   Iodine Other (See Comments)    Told to avoid due to Myasthenia Gravis   Macrolides And Ketolides Other (See Comments)    Told to avoid due to Myasthenia Gravis   Magnesium-Containing Compounds Other (See Comments)    Told to avoid due to Myasthenia Gravis  Niacin And Related Other (See Comments)    Myalgias   Norcuron [Vecuronium] Other (See Comments)    Told to avoid due to Myasthenia Gravis No paralytics   Noroxin [Norfloxacin] Other (See Comments)    Told to avoid due to Myasthenia Gravis   Prednisolone Nausea And Vomiting    In high doses    Prednisone Nausea And Vomiting    In high doses    Pronestyl [Procainamide] Other (See Comments)    Told to avoid due to Myasthenia Gravis   Quinine Derivatives Other (See Comments)    Told to  avoid due to Myasthenia Gravis   Tricor [Fenofibrate] Other (See Comments)    Myalgias GI upset   Tubocurarine Other (See Comments)    Told to avoid due to Myasthenia Gravis   Vicodin [Hydrocodone-Acetaminophen] Other (See Comments)    Headaches      Review of Systems: All systems reviewed and negative except where noted in HPI.   Lab Results  Component Value Date   WBC 8.7 07/14/2022   HGB 12.0 07/14/2022   HCT 36.7 07/14/2022   MCV 87.6 07/14/2022   PLT 311 07/14/2022    Lab Results  Component Value Date   CREATININE 0.64 07/14/2022   BUN 7 (L) 07/14/2022   NA 135 07/14/2022   K 3.2 (L) 07/14/2022   CL 102 07/14/2022   CO2 26 07/14/2022    Lab Results  Component Value Date   ALT 13 07/14/2022   AST 19 07/14/2022   ALKPHOS 79 07/14/2022   BILITOT 1.1 07/14/2022   Lab Results  Component Value Date   INR 1.2 07/13/2022   INR 1.1 04/09/2021   INR 0.96 10/04/2012     Physical Exam: BP 120/60 (BP Location: Left Arm, Patient Position: Sitting, Cuff Size: Normal)   Pulse 88   Ht '5\' 5"'$  (1.651 m)   SpO2 95%   BMI 30.64 kg/m  Constitutional: Pleasant female sitting in wheelchair HEENT: Normocephalic and atraumatic. Conjunctivae are normal. No scleral icterus. Neck supple.  Cardiovascular: Normal rate, regular rhythm.  Pulmonary/chest: Effort normal and breath sounds normal.  Abdominal: Soft, distended with ascites, nontender. Mass / hernia in RUQ, with ventral epigastric hernia.   Extremities: no edema Neurological: Alert and oriented to person place and time. Skin: Skin is warm and dry. No rashes noted. Psychiatric: Normal mood and affect. Behavior is normal.   ASSESSMENT: 81 y.o. female here for assessment of the following  1. Adenocarcinoma of unknown primary ([place])   2. Hepatic cirrhosis, unspecified hepatic cirrhosis type, unspecified whether ascites present ([place])   3. Nausea without vomiting    Unfortunately recent diagnosis of adenocarcinoma  involving the peritoneum/ascites of unknown primary although pathology suggests upper GI tract versus pancreaticobiliary in etiology.  Oncology/Dr. Learta Codding has asked [place] to perform an endoscopy to help clarify source of her malignancy which could influence potential chemotherapy options.  I have discussed what endoscopy is with the patient and her daughter.  She seems stable from a cardiopulmonary standpoint however this exam will be done at the hospital for anesthesia support given she is rather weak, using wheelchair to ambulate.  Following discussion of what this entails they want to proceed.  She has some nausea more recently although that could be related to narcotic use, otherwise she did not really have much of any baseline upper tract symptoms prior to the onset of this.  Her abdominal exam is abnormal with mass in the right upper quadrant and area of hernia,  quite possible she could have malignant change within the hernia sac there.  We will await her upper endoscopy, if clear source there we will obtain biopsies.  If negative then defer to oncology regarding further work-up she may need.  We reviewed her liver imaging.  Liver does appear cirrhotic, which is new.  She appears compensated otherwise, platelets normal, LFTs normal.  No significant alcohol use.  We discussed what cirrhosis is, risks for decompensation etc.  EGD will help screen for varices, otherwise she has no evidence of encephalopathy, her ammonia is normal, ascites is malignant.  I discussed how aggressive they want to be with work-up of her cirrhosis.  We could consider viral hepatitis screening, AFP, autoimmune work-up etc. if she wanted to know.  They want to think about that but hold off on labs today.  Her nausea is quite bothersome, I will add empiric omeprazole to see if this might help some of her upper tract symptoms at this point time, 20 mg daily.  Her bowels have been altered on the Dilaudid, we certainly want to avoid  constipation and impaction in this situation and recommend she take MiraLAX every day to keep her stools moving.  Otherwise, but I think she may benefit from a scheduled paracentesis at least weekly given the volume of peritoneal fluid that is been removed on her last few paracentesis.  She will discuss this with Dr. Learta Codding.    PLAN: - scheduled for EGD at the hospital AM of 12/4 with anesthesia support - start omeprazole '20mg'$  / day - take Miralax daily - prevent constipation - discussed cirrhosis, compensated now, they want to hold off on lab workup for now - recommend scheduled paracentesis once weekly for symptomatic management, would send cell count to rule out SBP next tap   Jolly Mango, MD [facility]

## 2022-08-04 ENCOUNTER — Inpatient Hospital Stay: Payer: Medicare Other | Attending: Oncology

## 2022-08-04 DIAGNOSIS — C801 Malignant (primary) neoplasm, unspecified: Secondary | ICD-10-CM | POA: Insufficient documentation

## 2022-08-04 DIAGNOSIS — G893 Neoplasm related pain (acute) (chronic): Secondary | ICD-10-CM | POA: Insufficient documentation

## 2022-08-04 DIAGNOSIS — R188 Other ascites: Secondary | ICD-10-CM | POA: Insufficient documentation

## 2022-08-04 DIAGNOSIS — G7000 Myasthenia gravis without (acute) exacerbation: Secondary | ICD-10-CM | POA: Insufficient documentation

## 2022-08-04 DIAGNOSIS — J45909 Unspecified asthma, uncomplicated: Secondary | ICD-10-CM | POA: Insufficient documentation

## 2022-08-04 DIAGNOSIS — R6881 Early satiety: Secondary | ICD-10-CM | POA: Insufficient documentation

## 2022-08-04 DIAGNOSIS — I509 Heart failure, unspecified: Secondary | ICD-10-CM | POA: Insufficient documentation

## 2022-08-04 DIAGNOSIS — R110 Nausea: Secondary | ICD-10-CM | POA: Insufficient documentation

## 2022-08-04 NOTE — Progress Notes (Signed)
[facility] CSW Progress Note  Clinical Education officer, museum contacted caregiver by phone to assess needs.  Sabrina [name], patient's daughter had called and said they have not received the w/c that was ordered two days ago.  CSW obtained DME information from chart and contacted Suella Grove at [facility] [phone].  She stated her notes said they tried to call Sabrina [name].  Erasmo Downer said she would have the delivery department call her tonight and try to arrange drop off.  CSW informed Sabrina [name] of conversation with Erasmo Downer.      Rodman Pickle Leotta Weingarten, LCSW

## 2022-08-06 NOTE — Anesthesia Preprocedure Evaluation (Signed)
Anesthesia Evaluation  Patient identified by MRN, date of birth, ID band Patient awake    Reviewed: Allergy & Precautions, NPO status , Patient's Chart, lab work & pertinent test results  History of Anesthesia Complications Negative for: history of anesthetic complications  Airway Mallampati: II  TM Distance: >3 FB Neck ROM: Full    Dental no notable dental hx.    Pulmonary asthma    Pulmonary exam normal        Cardiovascular hypertension, Pt. on medications +CHF  Normal cardiovascular exam     Neuro/Psych   Anxiety Depression    Myasthenia gravis    GI/Hepatic Neg liver ROS,GERD  Medicated,,adenocarcinoma of unknown origin, RLQ abdominal mass   Endo/Other  Hypothyroidism    Renal/GU negative Renal ROS  negative genitourinary   Musculoskeletal  (+) Arthritis ,    Abdominal   Peds  Hematology negative hematology ROS (+)   Anesthesia Other Findings Day of surgery medications reviewed with patient.  Reproductive/Obstetrics negative OB ROS                              Anesthesia Physical Anesthesia Plan  ASA: 3  Anesthesia Plan: MAC   Post-op Pain Management: Minimal or no pain anticipated   Induction:   PONV Risk Score and Plan: 2 and Treatment may vary due to age or medical condition and Propofol infusion  Airway Management Planned: Natural Airway and Nasal Cannula  Additional Equipment: None  Intra-op Plan:   Post-operative Plan:   Informed Consent: I have reviewed the patients History and Physical, chart, labs and discussed the procedure including the risks, benefits and alternatives for the proposed anesthesia with the patient or authorized representative who has indicated his/her understanding and acceptance.       Plan Discussed with: CRNA  Anesthesia Plan Comments:         Anesthesia Quick Evaluation

## 2022-08-07 ENCOUNTER — Ambulatory Visit (HOSPITAL_COMMUNITY): Payer: Medicare Other | Admitting: Physician Assistant

## 2022-08-07 ENCOUNTER — Inpatient Hospital Stay: Payer: Medicare Other | Admitting: Oncology

## 2022-08-07 ENCOUNTER — Ambulatory Visit (HOSPITAL_COMMUNITY)
Admission: RE | Admit: 2022-08-07 | Discharge: 2022-08-07 | Disposition: A | Discharge status: Home / Self Care | Payer: Medicare Other | Attending: Gastroenterology | Admitting: Gastroenterology

## 2022-08-07 ENCOUNTER — Encounter (HOSPITAL_COMMUNITY)
Admission: RE | Disposition: A | Discharge status: Home / Self Care | Payer: Self-pay | Source: Home / Self Care | Attending: Gastroenterology

## 2022-08-07 ENCOUNTER — Other Ambulatory Visit: Payer: Self-pay

## 2022-08-07 ENCOUNTER — Encounter (HOSPITAL_COMMUNITY): Payer: Self-pay | Admitting: Gastroenterology

## 2022-08-07 ENCOUNTER — Ambulatory Visit (HOSPITAL_BASED_OUTPATIENT_CLINIC_OR_DEPARTMENT_OTHER): Payer: Medicare Other | Admitting: Physician Assistant

## 2022-08-07 DIAGNOSIS — K2970 Gastritis, unspecified, without bleeding: Secondary | ICD-10-CM

## 2022-08-07 DIAGNOSIS — K3189 Other diseases of stomach and duodenum: Secondary | ICD-10-CM

## 2022-08-07 DIAGNOSIS — Z859 Personal history of malignant neoplasm, unspecified: Secondary | ICD-10-CM

## 2022-08-07 DIAGNOSIS — K219 Gastro-esophageal reflux disease without esophagitis: Secondary | ICD-10-CM | POA: Diagnosis not present

## 2022-08-07 DIAGNOSIS — Z1289 Encounter for screening for malignant neoplasm of other sites: Secondary | ICD-10-CM | POA: Insufficient documentation

## 2022-08-07 DIAGNOSIS — K7460 Unspecified cirrhosis of liver: Secondary | ICD-10-CM | POA: Diagnosis not present

## 2022-08-07 DIAGNOSIS — I110 Hypertensive heart disease with heart failure: Secondary | ICD-10-CM | POA: Insufficient documentation

## 2022-08-07 DIAGNOSIS — J45909 Unspecified asthma, uncomplicated: Secondary | ICD-10-CM | POA: Diagnosis not present

## 2022-08-07 DIAGNOSIS — F418 Other specified anxiety disorders: Secondary | ICD-10-CM | POA: Diagnosis not present

## 2022-08-07 DIAGNOSIS — E039 Hypothyroidism, unspecified: Secondary | ICD-10-CM | POA: Diagnosis not present

## 2022-08-07 DIAGNOSIS — R180 Malignant ascites: Secondary | ICD-10-CM | POA: Diagnosis not present

## 2022-08-07 DIAGNOSIS — C799 Secondary malignant neoplasm of unspecified site: Secondary | ICD-10-CM | POA: Insufficient documentation

## 2022-08-07 DIAGNOSIS — I509 Heart failure, unspecified: Secondary | ICD-10-CM | POA: Insufficient documentation

## 2022-08-07 DIAGNOSIS — R1909 Other intra-abdominal and pelvic swelling, mass and lump: Secondary | ICD-10-CM

## 2022-08-07 DIAGNOSIS — B9681 Helicobacter pylori [H. pylori] as the cause of diseases classified elsewhere: Secondary | ICD-10-CM | POA: Diagnosis not present

## 2022-08-07 DIAGNOSIS — K2960 Other gastritis without bleeding: Secondary | ICD-10-CM | POA: Diagnosis not present

## 2022-08-07 DIAGNOSIS — C801 Malignant (primary) neoplasm, unspecified: Secondary | ICD-10-CM

## 2022-08-07 HISTORY — PX: BIOPSY: SHX5522

## 2022-08-07 HISTORY — PX: ESOPHAGOGASTRODUODENOSCOPY (EGD) WITH PROPOFOL: SHX5813

## 2022-08-07 SURGERY — ESOPHAGOGASTRODUODENOSCOPY (EGD) WITH PROPOFOL
Anesthesia: Monitor Anesthesia Care

## 2022-08-07 MED ORDER — LIDOCAINE HCL 1 % IJ SOLN
INTRAMUSCULAR | Status: DC | PRN
  Administered 2022-08-07: 50 mg via INTRADERMAL

## 2022-08-07 MED ORDER — SODIUM CHLORIDE 0.9 % IV SOLN: INTRAVENOUS | Status: DC

## 2022-08-07 MED ORDER — LACTATED RINGERS IV SOLN: INTRAVENOUS | Status: DC

## 2022-08-07 MED ORDER — PROPOFOL 10 MG/ML IV BOLUS
INTRAVENOUS | Status: DC | PRN
  Administered 2022-08-07 (×2): 30 mg via INTRAVENOUS

## 2022-08-07 MED ORDER — PROPOFOL 500 MG/50ML IV EMUL
INTRAVENOUS | Status: DC | PRN
  Administered 2022-08-07: 125 ug/kg/min via INTRAVENOUS

## 2022-08-07 SURGICAL SUPPLY — 15 items

## 2022-08-07 NOTE — H&P (Addendum)
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 female  here for EGD to see if we can localize source of malignancy which will influence potential therapy for her. See office visit dated 08/03/22 for details. No interval changes. She has ascites and requiring periodic paracentesis. She denies any breathing problems today or chest pains, feels at baseline. Is fatigued. Case done at the hospital for anesthesia support as she is not able to ambulate on her own too well.    I have discussed risks / benefits of anesthesia and endoscopic procedure with Allena Napoleon and her daughter at length, and they wish to proceed with the exams as outlined today.    Past Medical History:  Diagnosis Date   Anxiety    Arthritis    Asthma    Avascular necrosis (HCC)    Bradycardia    symptomatic   Cancer (HCC)    COUPLE OF SKIN CANCERS   Carpal tunnel syndrome    LEFT --NUMBNESS   Cataracts, bilateral    Cerebral aneurysm    Complication of anesthesia    BP FALLS/"ANAPHYLACTIC SHOCK"/ EXTREME TREMORS;  DID  FINE WITH SURGERIES APRIL AND OCT 2013 AT [facility] SURGERIES WERE SHORT PROCEDURES--PROBLEMS WITH ANESTHESIA SEEM TO OCCUR WITH THE LONGER PROCEDURES.THE TREMORS WERE AFTER HIP REPLACEMNT 22011-ARMS WERE FLAILING--PT STATES IT TOOK 4 DOSES OF DEMEROL BEFORE THE TREMORS STOPPED.   Depression    History of skin cancer    HSV (herpes simplex virus) infection    CHEST   Hyperlipidemia    Hypertension    PT HAS LOST WEIGHT--B/P NOW WITHIN NORMALS--IS ON B/P MEDICINE   Hypertriglyceridemia    Hypothyroidism    Myasthenia gravis    DOING WELL ON CELLCEPT-FOLLOWED BY DR. LISA HOBSON - [facility]UR[facility]IST   OA (osteoarthritis) of hip    Osteopenia    Osteoporosis    Pain    LEFT SHOULDER PAIN-TORN ROTATOR  CUFF-VERY LIMITED ROM   Pain    TOP OF RIGHT SHOULDER--ROM OK AT PRESENT   Pneumonia    Rash, skin    LOWER ABDOMEN-FUNGAL INFECTION-PT HAS TOPICAL OINTMENT TO USE AS NEEDED.   Seasonal allergies    Thyroid disease     Past Surgical History:  Procedure Laterality Date   BELPHAROPTOSIS REPAIR  01/2006   PTOSIS REPAIR BILATERAL   CARPAL TUNNEL RELEASE  05/31/2012   Procedure: CARPAL TUNNEL RELEASE;  Surgeon: Mcarthur Rossetti, MD;  Location: [facility];  Service: Orthopedics;  Laterality: Left;  Left Open Carpal Tunnel Release   CARPAL TUNNEL RELEASE  10/10/2012   Procedure: CARPAL TUNNEL RELEASE;  Surgeon: Nita Sells, MD;  Location: [facility];  Service: Orthopedics;  Laterality: Left;   CEREBRAL ANEURYSM REPAIR  10/2005   GUGLIEMI COILS TO REPAIR ANEURYSM; NO RESIDUAL PROBLEMS--HAS FOLLOW UP MRI EVERY 1 OR 2 YRS   CHOLECYSTECTOMY     EXTREME DROP IN BLOOD PRESSURE WITH THIS SURGERY- YRS AGO- PT WAS 81 YRS OLD   IR PARACENTESIS  07/13/2022   IR PARACENTESIS  07/25/2022   IR PARACENTESIS  08/02/2022   JOINT REPLACEMENT  10/19/09   RT HIP--SEVERE TREMORS, FLAILING OF ARMS AFTER THIS SURGERY.   left shoulder arthroscopy      REVERSE SHOULDER ARTHROPLASTY  10/10/2012   Procedure: REVERSE SHOULDER ARTHROPLASTY;  Surgeon: Nita Sells, MD;  Location: [facility];  Service: Orthopedics;  Laterality: Left;   TONSILLECTOMY AND ADENOIDECTOMY     TOTAL HIP ARTHROPLASTY     right   TUBAL LIGATION     ANAPHYLACTIC SHOCK AFTER THIS SURGERY-CAUSE THOUGHT TO  BE RELATED TO SODIUM PENTOTHAL    Prior to Admission medications   Medication Sig Start Date End Date Taking? Authorizing Provider  albuterol (VENTOLIN HFA) 108 (90 Base) MCG/ACT inhaler Inhale 1-2 puffs into the lungs every 6 (six) hours as needed for wheezing or shortness of breath.   Yes [provider]  ALPRAZolam Duanne Moron) 0.5 MG tablet Take 0.5 mg by mouth 4 (four) times daily. 05/31/22  Yes [provider]   amoxicillin (AMOXIL) 500 MG capsule Take 2,000 mg by mouth See admin instructions. Take 2000 mg 1 hour prior to dental work   Yes [provider]  Ascorbic Acid (VITAMIN C) 1000 MG tablet Take 1,000 mg by mouth every morning.    Yes [provider]  aspirin EC 325 MG tablet Take 325 mg by mouth daily.   Yes [provider]  atorvastatin (LIPITOR) 20 MG tablet Take 1 tablet (20 mg total) by mouth daily. 04/13/21  Yes Einar Pheasant, NP  bismuth subsalicylate (PEPTO BISMOL) 262 MG/15ML suspension Take 30 mLs by mouth every 6 (six) hours as needed for indigestion.   Yes [provider]  Cholecalciferol (VITAMIN D-3) 25 MCG (1000 UT) CAPS Take 1 capsule (1,000 Units total) by mouth every morning. 06/27/22  Yes Shalhoub, Sherryll Burger, MD  diclofenac Sodium (VOLTAREN) 1 % GEL Apply 2 g topically every 6 (six) hours as needed (pain).   Yes [provider]  FLUoxetine (PROZAC) 20 MG capsule Take 20 mg by mouth daily. 04/08/22  Yes [provider]  fluticasone (FLONASE) 50 MCG/ACT nasal spray Place 2 sprays into both nostrils daily. 07/27/17  Yes [provider]  HYDROmorphone (DILAUDID) 2 MG tablet Take 0.5-1 tablets (1-2 mg total) by mouth every 4 (four) hours as needed for severe pain. 07/26/22  Yes Ladell Pier, MD  levothyroxine (SYNTHROID, LEVOTHROID) 75 MCG tablet Take 75 mcg by mouth every morning. MUST TAKE BRAND NAME SYNTHROID   Yes [provider]  montelukast (SINGULAIR) 10 MG tablet Take 10 mg by mouth at bedtime. MUST BE BRAND NAME ONLY-SINGULAIR   Yes [provider]  Multiple Vitamins-Minerals (CENTRUM SILVER) tablet Take 1 tablet by mouth daily. 02/16/11  Yes [provider]  mycophenolate (CELLCEPT) 500 MG tablet Take 1,000 mg by mouth 2 (two) times daily.    Yes [provider]  Omega 3 1200 MG CAPS Take 1,200 mg by mouth daily.   Yes [provider]  ondansetron (ZOFRAN) 8 MG tablet  Take 1 tablet (8 mg total) by mouth every 8 (eight) hours as needed for nausea or vomiting. 07/26/22  Yes Ladell Pier, MD  potassium chloride (KLOR-CON M) 10 MEQ tablet Take 1 tablet (10 mEq total) by mouth 2 (two) times daily. 07/26/22  Yes Ladell Pier, MD  senna (SENOKOT) 8.6 MG TABS tablet Take 2 tablets (17.2 mg total) by mouth at bedtime. Do note take if you are having loose stools or diarrhea Patient taking differently: Take 1 tablet by mouth daily as needed for mild constipation. 06/27/22  Yes Shalhoub, Sherryll Burger, MD  traZODone (DESYREL) 150 MG tablet Take 75 mg by mouth at bedtime.  Yes [provider]  TRELEGY ELLIPTA 200-62.5-25 MCG/INH AEPB Inhale 1 puff into the lungs daily. 02/28/21  Yes [provider]  valsartan (DIOVAN) 160 MG tablet Take 1 tablet (160 mg total) by mouth daily. 06/27/22 06/27/23 Yes Shalhoub, Sherryll Burger, MD  aspirin EC 81 MG EC tablet Take 1 tablet (81 mg total) by mouth daily. Swallow whole. 04/13/21   Einar Pheasant, NP  omeprazole (PRILOSEC) 20 MG capsule Take 1 capsule (20 mg total) by mouth daily. 08/03/22   Laryah Neuser, Carlota Raspberry, MD  polyethylene glycol powder (MIRALAX) 17 GM/SCOOP powder Take 255 g by mouth daily as needed (constipation refractory to daily senna use). 06/27/22   Shalhoub, Sherryll Burger, MD    Current Facility-Administered Medications  Medication Dose Route Frequency Provider Last Rate Last Admin   lactated ringers infusion   Intravenous Continuous Havery Moros Carlota Raspberry, MD 10 mL/hr at 08/07/22 0757 Restarted at 08/07/22 3825    Allergies as of 07/31/2022 - Review Complete 07/26/2022  Allergen Reaction Noted   Imuran [azathioprine] Anaphylaxis and Other (See Comments)    Other Other (See Comments)    Pentothal [thiopental] Anaphylaxis    Aminoglycosides Other (See Comments) 03/15/2011   Anectine [succinylcholine] Other (See Comments) 03/15/2011   Avelox [moxifloxacin] Other (See Comments) 04/16/2014   Beta  adrenergic blockers Other (See Comments) 03/15/2011   Calcium channel blockers Other (See Comments) 03/15/2011   Cardioquin [quinidine] Other (See Comments) 03/15/2011   Cephalosporins Other (See Comments) 03/15/2011   Cipro [ciprofloxacin hcl] Other (See Comments)    Cleocin [clindamycin] Other (See Comments)    Colistin and related Other (See Comments)    Depen [penicillamine] Other (See Comments) 10/10/2012   Floxin otic [ofloxacin] Other (See Comments) 03/15/2011   Fosamax [alendronate sodium] Other (See Comments) 10/02/2012   Imuran [azathioprine sodium] Other (See Comments) 03/15/2011   Iodinated contrast media Other (See Comments) 03/15/2011   Iodine Other (See Comments) 03/15/2011   Macrolides and ketolides Other (See Comments) 03/15/2011   Magnesium-containing compounds Other (See Comments) 03/15/2011   Niacin and related Other (See Comments) 03/04/2021   Norcuron [vecuronium] Other (See Comments) 03/15/2011   Noroxin [norfloxacin] Other (See Comments) 03/15/2011   Prednisolone Nausea And Vomiting 06/17/2015   Prednisone Nausea And Vomiting 06/17/2015   Pronestyl [procainamide] Other (See Comments) 03/15/2011   Quinine derivatives Other (See Comments) 03/15/2011   Tricor [fenofibrate] Other (See Comments) 03/04/2021   Tubocurarine Other (See Comments) 03/15/2011   Vicodin [hydrocodone-acetaminophen] Other (See Comments) 06/17/2015    Family History  Problem Relation Age of Onset   Hypertension Mother    Heart disease Mother        heart attack   Stroke Father    Heart disease Father        heart attack   Breast cancer Neg Hx     Social History   Socioeconomic History   Marital status: Single    Spouse name: Not on file   Number of children: Not on file   Years of education: Not on file   Highest education level: Not on file  Occupational History   Not on file  Tobacco Use   Smoking status: Never   Smokeless tobacco: Never  Vaping Use   Vaping Use: Never  used  Substance and Sexual Activity   Alcohol use: Not Currently    Comment: OCCASIONAL   Drug use: No   Sexual activity: Not on file  Other Topics Concern   Not on file  Social History Narrative  Not on file   Social Determinants of Health   Financial Resource Strain: Medium Risk (07/31/2022)   Overall Financial Resource Strain (CARDIA)    Difficulty of Paying Living Expenses: Somewhat hard  Food Insecurity: Food Insecurity Present (06/26/2022)   Hunger Vital Sign    Worried About Running Out of Food in the Last Year: Sometimes true    Ran Out of Food in the Last Year: Sometimes true  Transportation Needs: No Transportation Needs (06/26/2022)   PRAPARE - Hydrologist (Medical): No    Lack of Transportation (Non-Medical): No  Physical Activity: Not on file  Stress: Not on file  Social Connections: Not on file  Intimate Partner Violence: Not At Risk (06/26/2022)   Humiliation, Afraid, Rape, and Kick questionnaire    Fear of Current or Ex-Partner: No    Emotionally Abused: No    Physically Abused: No    Sexually Abused: No    Review of Systems: All other review of systems negative except as mentioned in the HPI.  Physical Exam: Vital signs BP (!) 160/63   Pulse 97   Temp 98.5 F (36.9 C) (Tympanic)   Resp 12   Ht '5\' 5"'$  (1.651 m)   Wt 81.6 kg   SpO2 96%   BMI 29.95 kg/m   General:   Alert,  Well-developed, pleasant and cooperative in NAD Lungs:  Clear throughout to auscultation.   Heart:  Regular rate and rhythm Abdomen:  Soft, distended with ascites with RUQ mass Neuro/Psych:  Alert and cooperative. Normal mood and affect. A and O x 3  Jolly Mango, MD [facility]

## 2022-08-07 NOTE — Op Note (Signed)
[facility] Patient Name: Sabrina [name] Procedure Date: 08/07/2022 MRN: [id number] Attending MD: Carlota Raspberry. Havery Moros , MD, [id number] Date of Birth: 09/29/1940 CSN: [id number] Age: 81 Admit Type: Outpatient Procedure:                Upper GI endoscopy Indications:              Personal history of malignant neoplasm -                            adenocarcinoma of unknown primary source - path                            suggested possible upper GI tract. Newly diagnosed                            cirrhosis as well. EGD to help clarify source of                            malignancy. Providers:                Carlota Raspberry. Havery Moros, MD, Mikey College, RN,                            Frazier Richards, Technician Referring MD:              Medicines:                Monitored Anesthesia Care Complications:            No immediate complications. Estimated blood loss:                            Minimal. Estimated Blood Loss:     Estimated blood loss was minimal. Procedure:                Pre-Anesthesia Assessment:                           - Prior to the procedure, a History and Physical                            was performed, and patient medications and                            allergies were reviewed. The patient's tolerance of                            previous anesthesia was also reviewed. The risks                            and benefits of the procedure and the sedation                            options and risks were discussed with the patient.                            All  questions were answered, and informed consent                            was obtained. Prior Anticoagulants: The patient has                            taken no anticoagulant or antiplatelet agents. ASA                            Grade Assessment: III - A patient with severe                            systemic disease. After reviewing the risks and                            benefits, the patient  was deemed in satisfactory                            condition to undergo the procedure.                           After obtaining informed consent, the endoscope was                            passed under direct vision. Throughout the                            procedure, the patient's blood pressure, pulse, and                            oxygen saturations were monitored continuously. The                            GIF-H190 ([id number]) Olympus endoscope was introduced                            through the mouth, and advanced to the second part                            of duodenum. The upper GI endoscopy was                            accomplished without difficulty. The patient                            tolerated the procedure well. Scope In: Scope Out: Findings:      Esophagogastric landmarks were identified: the Z-line was found at 37       cm, the gastroesophageal junction was found at 37 cm and the upper       extent of the gastric folds was found at 37 cm from the incisors.      The exam of the esophagus was otherwise normal. No varices.      Suspected extrinsic compression on the stomach was found in the gastric  fundus and in the gastric body.      Diffuse inflammation characterized by congestion (edema), erythema and       friability was found in the entire examined stomach. Biopsies were taken       with a cold forceps for Helicobacter pylori testing.      The stomach was J shaped. The exam of the stomach was otherwise normal.      The examined duodenum was normal. Impression:               - Esophagogastric landmarks identified.                           - Normal esophagus otherwise - no varices.                           - Suspected extrinsic compression in the gastric                            fundus and in the gastric body.                           - Gastritis. Biopsied.                           - Normal stomach otherwise.                           - Normal  examined duodenum.                           No clear source of malignancy on this exam.                            Extrinsic compression noted of unclear                            significance. Pancreaticobiliary source vs. small                            bowel (hernia) remains possible - hernia is quite                            hard to palpation, suspcious for mass lesion. Will                            relay to Dr. Learta Codding. Moderate Sedation:      No moderate sedation, case performed with MAC Recommendation:           - Patient has a contact number available for                            emergencies. The signs and symptoms of potential                            delayed complications were discussed with the  patient. Return to normal activities tomorrow.                            Written discharge instructions were provided to the                            patient.                           - Resume previous diet.                           - Continue present medications including omeprazole                            (recently started during clinic visit).                           - Await pathology results. Procedure Code(s):        --- Professional ---                           [phone], Esophagogastroduodenoscopy, flexible,                            transoral; with biopsy, single or multiple Diagnosis Code(s):        --- Professional ---                           K31.89, Other diseases of stomach and duodenum                           K29.70, Gastritis, unspecified, without bleeding                           Z85.9, Personal history of malignant neoplasm,                            unspecified CPT copyright 2022 American Medical Association. All rights reserved. The codes documented in this report are preliminary and upon coder review may  be revised to meet current compliance requirements. Remo Lipps P. Jaselle Pryer, MD 08/07/2022 8:55:25 AM This report  has been signed electronically. Number of Addenda: 0

## 2022-08-07 NOTE — Anesthesia Postprocedure Evaluation (Signed)
Anesthesia Post Note  Patient: Allena Napoleon  Procedure(s) Performed: ESOPHAGOGASTRODUODENOSCOPY (EGD) WITH PROPOFOL BIOPSY     Patient location during evaluation: PACU Anesthesia Type: MAC Level of consciousness: awake and alert Pain management: pain level controlled Vital Signs Assessment: post-procedure vital signs reviewed and stable Respiratory status: spontaneous breathing, nonlabored ventilation and respiratory function stable Cardiovascular status: blood pressure returned to baseline Postop Assessment: no apparent nausea or vomiting Anesthetic complications: no   No notable events documented.  Last Vitals:  Vitals:   08/07/22 0910 08/07/22 0919  BP: (!) 108/51 (!) 126/53  Pulse: 89 85  Resp: (!) 21 19  Temp:    SpO2: 96% 90%    Last Pain:  Vitals:   08/07/22 0919  TempSrc:   PainSc: 0-No pain                 Marthenia Rolling

## 2022-08-07 NOTE — Discharge Instructions (Signed)
YOU HAD AN ENDOSCOPIC PROCEDURE TODAY: Refer to the procedure report and other information in the discharge instructions given to you for any specific questions about what was found during the examination. If this information does not answer your questions, please call [place] office at [phone] to clarify.  ° °YOU SHOULD EXPECT: Some feelings of bloating in the abdomen. Passage of more gas than usual. Walking can help get rid of the air that was put into your GI tract during the procedure and reduce the bloating.  ° °DIET: Your first meal following the procedure should be a light meal and then it is ok to progress to your normal diet. A half-sandwich or bowl of soup is an example of a good first meal. Heavy or fried foods are harder to digest and may make you feel nauseous or bloated. Drink plenty of fluids but you should avoid alcoholic beverages for 24 hours. ° °ACTIVITY: Your care partner should take you home directly after the procedure. You should plan to take it easy, moving slowly for the rest of the day. You can resume normal activity the day after the procedure however YOU SHOULD NOT DRIVE, use power tools, machinery or perform tasks that involve climbing or major physical exertion for 24 hours (because of the sedation medicines used during the test).  ° °SYMPTOMS TO REPORT IMMEDIATELY: °A gastroenterologist can be reached at any hour. Please call [phone]  for any of the following symptoms:  ° °Following upper endoscopy (EGD, EUS, ERCP, esophageal dilation) °Vomiting of blood or coffee ground material  °New, significant abdominal pain  °New, significant chest pain or pain under the shoulder blades  °Painful or persistently difficult swallowing  °New shortness of breath  °Black, tarry-looking or red, bloody stools ° °FOLLOW UP:  °If any biopsies were taken you will be contacted by phone or by letter within the next 1-3 weeks. Call [phone]  if you have not heard about the biopsies in 3 weeks.    °Please also call with any specific questions about appointments or follow up tests. ° °

## 2022-08-07 NOTE — Anesthesia Procedure Notes (Signed)
Procedure Name: MAC Date/Time: 08/07/2022 8:32 AM  Performed by: Claudia Desanctis, CRNAPre-anesthesia Checklist: Patient identified, Emergency Drugs available, Suction available and Patient being monitored Patient Re-evaluated:Patient Re-evaluated prior to induction Oxygen Delivery Method: Simple face mask

## 2022-08-07 NOTE — Transfer of Care (Signed)
Immediate Anesthesia Transfer of Care Note  Patient: Sabrina [name]  Procedure(s) Performed: ESOPHAGOGASTRODUODENOSCOPY (EGD) WITH PROPOFOL BIOPSY  Patient Location: PACU and Endoscopy Unit  Anesthesia Type:MAC  Level of Consciousness: oriented, drowsy, and patient cooperative  Airway & Oxygen Therapy: Patient Spontanous Breathing and Patient connected to face mask oxygen  Post-op Assessment: Report given to RN  Post vital signs: Reviewed and stable  Last Vitals:  Vitals Value Taken Time  BP    Temp    Pulse    Resp 21 08/07/22 0855  SpO2      Last Pain:  Vitals:   08/07/22 0748  TempSrc: Tympanic  PainSc: 0-No pain         Complications: No notable events documented.

## 2022-08-08 ENCOUNTER — Encounter: Payer: Self-pay | Admitting: *Deleted

## 2022-08-08 ENCOUNTER — Inpatient Hospital Stay (HOSPITAL_BASED_OUTPATIENT_CLINIC_OR_DEPARTMENT_OTHER): Payer: Medicare Other | Admitting: Oncology

## 2022-08-08 ENCOUNTER — Other Ambulatory Visit (HOSPITAL_BASED_OUTPATIENT_CLINIC_OR_DEPARTMENT_OTHER): Payer: Self-pay

## 2022-08-08 VITALS — BP 117/62 | HR 72 | Temp 98.2°F | Resp 16 | Wt 179.4 lb

## 2022-08-08 DIAGNOSIS — G893 Neoplasm related pain (acute) (chronic): Secondary | ICD-10-CM | POA: Diagnosis not present

## 2022-08-08 DIAGNOSIS — G7000 Myasthenia gravis without (acute) exacerbation: Secondary | ICD-10-CM | POA: Diagnosis not present

## 2022-08-08 DIAGNOSIS — R110 Nausea: Secondary | ICD-10-CM | POA: Diagnosis not present

## 2022-08-08 DIAGNOSIS — C801 Malignant (primary) neoplasm, unspecified: Secondary | ICD-10-CM | POA: Diagnosis not present

## 2022-08-08 DIAGNOSIS — I509 Heart failure, unspecified: Secondary | ICD-10-CM | POA: Diagnosis not present

## 2022-08-08 DIAGNOSIS — R6881 Early satiety: Secondary | ICD-10-CM | POA: Diagnosis not present

## 2022-08-08 DIAGNOSIS — J45909 Unspecified asthma, uncomplicated: Secondary | ICD-10-CM | POA: Diagnosis not present

## 2022-08-08 DIAGNOSIS — R188 Other ascites: Secondary | ICD-10-CM | POA: Diagnosis not present

## 2022-08-08 MED ORDER — ONDANSETRON HCL 8 MG PO TABS
8.0000 mg | ORAL_TABLET | Freq: Three times a day (TID) | ORAL | 1 refills | Status: AC | PRN
  Filled 2022-08-08: qty 30, 10d supply, fill #0

## 2022-08-08 MED ORDER — HYDROMORPHONE HCL 2 MG PO TABS
1.0000 mg | ORAL_TABLET | ORAL | 0 refills | Status: AC | PRN
  Filled 2022-08-08: qty 30, 5d supply, fill #0

## 2022-08-08 NOTE — Progress Notes (Signed)
[facility] OFFICE PROGRESS NOTE   Diagnosis: Unknown primary carcinoma  INTERVAL HISTORY:   Ms. Cancio returns as scheduled.  She underwent a paracentesis for 4.6 L on 08/02/2022.  The fluid returns after several days.  She has abdominal pain.  She takes hydromorphone as needed.  She is here today with her daughter. Ms. Binford underwent an upper endoscopy 08/07/2022.  There was extrinsic compression of the stomach in the gastric fundus and gastric body.  Congestion and erythema was found in the entire stomach.  The stomach was biopsied.  The pathology is pending.  Objective:  Vital signs in last 24 hours:  Blood pressure 117/62, pulse 72, temperature 98.2 F (36.8 C), temperature source Temporal, resp. rate 16, weight 179 lb 6.4 oz (81.4 kg), SpO2 100 %.     Resp: Lungs clear bilaterally Cardio: Regular rate and rhythm GI: Distended with ascites, firm hernia mass in the right mid abdomen Vascular: No leg edema    Lab Results:  Lab Results  Component Value Date   WBC 8.7 07/14/2022   HGB 12.0 07/14/2022   HCT 36.7 07/14/2022   MCV 87.6 07/14/2022   PLT 311 07/14/2022   NEUTROABS 14.7 (H) 07/13/2022    CMP  Lab Results  Component Value Date   NA 135 07/14/2022   K 3.2 (L) 07/14/2022   CL 102 07/14/2022   CO2 26 07/14/2022   GLUCOSE 116 (H) 07/14/2022   BUN 7 (L) 07/14/2022   CREATININE 0.64 07/14/2022   CALCIUM 8.4 (L) 07/14/2022   PROT 4.5 (L) 07/14/2022   ALBUMIN 2.5 (L) 07/14/2022   AST 19 07/14/2022   ALT 13 07/14/2022   ALKPHOS 79 07/14/2022   BILITOT 1.1 07/14/2022   GFRNONAA >60 07/14/2022   GFRAA >60 07/28/2015    No results found for: "CEA1", "CEA", "PJA250", "CA125"  Lab Results  Component Value Date   INR 1.2 07/13/2022   LABPROT 15.0 07/13/2022    Imaging:  No results found.  Medications: I have reviewed the patient's current medications.   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, CK7 and CDX2  positive CT abdomen/pelvis 04/22/2019 23-2 anterior abdominal wall hernias, no bowel enters the larger hernia with fluid and inflammation in the herniated fat, small volume ascites, increased attenuation in the mesentery/omentum CT abdomen/pelvis 06/25/2021-superior anterior abdominal peritoneal/omental fat stranding and nodularity extending into a moderate volume right spigelian hernia, small supraumbilical ventral wall hernia, small volume ascites, no bowel obstruction Abdomen/pelvis 07/13/2022-large volume ascites, unchanged appearance of fat, fluid, soft tissue stranding and a midline hernia and right hemiabdomen hernia, extensive stranding and nodularity in the omentum concerning for new metastatic disease, coarse liver contour suggestive of cirrhosis Upper endoscopy 08/07/2022-extrinsic compression of the stomach, gastritis, no mass identified Nausea, early satiety, and pain secondary to #1 Myasthenia gravis-maintained on CellCept 4.   Asthma 5.   TIA August 2022 6.   "CHF " 7.   Pain, erythema, and palpable cord at the left arm 07/26/2022 8.   Multiple drug allergies    Disposition: Ms. Casebolt has been diagnosed with metastatic carcinoma involving abdominal carcinomatosis.  No primary tumor site has been identified.  We discussed the differential diagnosis.  She understands the primary tumor is most likely from an upper GI source or colorectal cancer.  No therapy will be curative. She is symptomatic with abdominal pain, nausea, and ascites.  We discussed further diagnostic evaluation including a colonoscopy and PET scan.  We discussed a trial of systemic therapy versus comfort care.  Ms. Tenorio indicates her quality of life is most important to her.  She does not wish to undergo chemotherapy.  We will submit the ascites for mismatch repair protein testing to be sure she is not a candidate for immunotherapy.  She will be referred for home hospice care.  We discussed CPR and ACLS.  She would like  to be placed on a no CODE BLUE status.  Ms. Manwarren will continue her morphine as needed for pain.  I refilled her prescription today.  She is scheduled for paracentesis later this week.  She will return for an office visit in 3-4 weeks.  Betsy Coder, MD  08/08/2022  9:49 AM

## 2022-08-08 NOTE — Progress Notes (Unsigned)
PATIENT NAVIGATOR PROGRESS NOTE  Name: Sabrina [name] Date: 08/08/2022 MRN: [id number]  DOB: 04-21-1941   Reason for visit:  F/U visit with Dr Benay Spice  Comments:  Met with pt and daughter during visit with Dr Benay Spice to discuss treatment options After much discussion pt has elected to enroll into Hospice services. Referral made to [facility] and DNR order faxed to [phone]. Called and spoke with intake  Order placed for IR paracentesis for comfort care. Appointment will be Friday, August 11, 2022 with arrival time of 1:30 at [facility] entrance C Doppler appt for Friday canceled Order placed for molecular studies for mismatch repair protein by [facility] on accession number [id number] with pathology    Time spent counseling/coordinating care: > 60 minutes

## 2022-08-09 ENCOUNTER — Inpatient Hospital Stay: Payer: Medicare Other | Admitting: Licensed Clinical Social Worker

## 2022-08-09 ENCOUNTER — Other Ambulatory Visit: Payer: Self-pay | Admitting: *Deleted

## 2022-08-09 DIAGNOSIS — C801 Malignant (primary) neoplasm, unspecified: Secondary | ICD-10-CM

## 2022-08-09 NOTE — Progress Notes (Signed)
Sabrina [name]  Clinical Education officer, museum contacted caregiver by phone.  Her daughter, Vickii Chafe, stated she is being admitted to Hospice care tomorrow.  She said it was her mother's decision.  Patient's quality of life is important to her and Vickii Chafe did not want her to suffer.  Provided active listening and support.    Rodman Pickle Calliope Delangel, LCSW

## 2022-08-10 ENCOUNTER — Encounter (HOSPITAL_COMMUNITY): Payer: Self-pay | Admitting: Gastroenterology

## 2022-08-10 DIAGNOSIS — Z8673 Personal history of transient ischemic attack (TIA), and cerebral infarction without residual deficits: Secondary | ICD-10-CM | POA: Diagnosis not present

## 2022-08-10 DIAGNOSIS — K219 Gastro-esophageal reflux disease without esophagitis: Secondary | ICD-10-CM | POA: Diagnosis not present

## 2022-08-10 DIAGNOSIS — E876 Hypokalemia: Secondary | ICD-10-CM | POA: Diagnosis not present

## 2022-08-10 DIAGNOSIS — R001 Bradycardia, unspecified: Secondary | ICD-10-CM | POA: Diagnosis not present

## 2022-08-10 DIAGNOSIS — C786 Secondary malignant neoplasm of retroperitoneum and peritoneum: Secondary | ICD-10-CM | POA: Diagnosis not present

## 2022-08-10 DIAGNOSIS — J302 Other seasonal allergic rhinitis: Secondary | ICD-10-CM | POA: Diagnosis not present

## 2022-08-10 DIAGNOSIS — M1990 Unspecified osteoarthritis, unspecified site: Secondary | ICD-10-CM | POA: Diagnosis not present

## 2022-08-10 DIAGNOSIS — I509 Heart failure, unspecified: Secondary | ICD-10-CM | POA: Diagnosis not present

## 2022-08-10 DIAGNOSIS — K7460 Unspecified cirrhosis of liver: Secondary | ICD-10-CM | POA: Diagnosis not present

## 2022-08-10 DIAGNOSIS — E785 Hyperlipidemia, unspecified: Secondary | ICD-10-CM | POA: Diagnosis not present

## 2022-08-10 DIAGNOSIS — G7000 Myasthenia gravis without (acute) exacerbation: Secondary | ICD-10-CM | POA: Diagnosis not present

## 2022-08-10 DIAGNOSIS — I110 Hypertensive heart disease with heart failure: Secondary | ICD-10-CM | POA: Diagnosis not present

## 2022-08-10 DIAGNOSIS — R0600 Dyspnea, unspecified: Secondary | ICD-10-CM | POA: Diagnosis not present

## 2022-08-10 DIAGNOSIS — C801 Malignant (primary) neoplasm, unspecified: Secondary | ICD-10-CM | POA: Diagnosis not present

## 2022-08-10 DIAGNOSIS — R180 Malignant ascites: Secondary | ICD-10-CM | POA: Diagnosis not present

## 2022-08-10 DIAGNOSIS — E871 Hypo-osmolality and hyponatremia: Secondary | ICD-10-CM | POA: Diagnosis not present

## 2022-08-10 LAB — CYTOLOGY - NON PAP

## 2022-08-10 LAB — SURGICAL PATHOLOGY

## 2022-08-11 ENCOUNTER — Other Ambulatory Visit (HOSPITAL_COMMUNITY): Payer: Medicare Other

## 2022-08-11 ENCOUNTER — Ambulatory Visit (HOSPITAL_COMMUNITY)
Admission: RE | Admit: 2022-08-11 | Discharge: 2022-08-11 | Disposition: A | Discharge status: Home / Self Care | Source: Ambulatory Visit | Attending: Oncology | Admitting: Oncology

## 2022-08-11 DIAGNOSIS — R188 Other ascites: Secondary | ICD-10-CM | POA: Diagnosis not present

## 2022-08-11 DIAGNOSIS — C801 Malignant (primary) neoplasm, unspecified: Secondary | ICD-10-CM | POA: Diagnosis not present

## 2022-08-11 DIAGNOSIS — R0600 Dyspnea, unspecified: Secondary | ICD-10-CM | POA: Diagnosis not present

## 2022-08-11 DIAGNOSIS — I110 Hypertensive heart disease with heart failure: Secondary | ICD-10-CM | POA: Diagnosis not present

## 2022-08-11 DIAGNOSIS — R180 Malignant ascites: Secondary | ICD-10-CM | POA: Diagnosis not present

## 2022-08-11 DIAGNOSIS — C786 Secondary malignant neoplasm of retroperitoneum and peritoneum: Secondary | ICD-10-CM | POA: Diagnosis not present

## 2022-08-11 DIAGNOSIS — I509 Heart failure, unspecified: Secondary | ICD-10-CM | POA: Diagnosis not present

## 2022-08-11 HISTORY — PX: IR PARACENTESIS: IMG2679

## 2022-08-11 MED ORDER — LIDOCAINE HCL 1 % IJ SOLN
INTRAMUSCULAR | Status: AC
  Filled 2022-08-11: qty 20

## 2022-08-11 NOTE — Procedures (Signed)
PROCEDURE SUMMARY:  Successful image-guided paracentesis from the left lower abdomen.  Yielded 3.2 liters of hazy yellow fluid.  No immediate complications.  EBL = trace. Patient tolerated well.   Specimen was not sent for labs.  Please see imaging section of Epic for full dictation.   Armando Gang Netanya Yazdani PA-C 08/11/2022 2:56 PM

## 2022-08-13 DIAGNOSIS — I110 Hypertensive heart disease with heart failure: Secondary | ICD-10-CM | POA: Diagnosis not present

## 2022-08-13 DIAGNOSIS — R180 Malignant ascites: Secondary | ICD-10-CM | POA: Diagnosis not present

## 2022-08-13 DIAGNOSIS — R0600 Dyspnea, unspecified: Secondary | ICD-10-CM | POA: Diagnosis not present

## 2022-08-13 DIAGNOSIS — I509 Heart failure, unspecified: Secondary | ICD-10-CM | POA: Diagnosis not present

## 2022-08-13 DIAGNOSIS — C801 Malignant (primary) neoplasm, unspecified: Secondary | ICD-10-CM | POA: Diagnosis not present

## 2022-08-13 DIAGNOSIS — C786 Secondary malignant neoplasm of retroperitoneum and peritoneum: Secondary | ICD-10-CM | POA: Diagnosis not present

## 2022-08-14 DIAGNOSIS — I509 Heart failure, unspecified: Secondary | ICD-10-CM | POA: Diagnosis not present

## 2022-08-14 DIAGNOSIS — R0600 Dyspnea, unspecified: Secondary | ICD-10-CM | POA: Diagnosis not present

## 2022-08-14 DIAGNOSIS — C786 Secondary malignant neoplasm of retroperitoneum and peritoneum: Secondary | ICD-10-CM | POA: Diagnosis not present

## 2022-08-14 DIAGNOSIS — R180 Malignant ascites: Secondary | ICD-10-CM | POA: Diagnosis not present

## 2022-08-14 DIAGNOSIS — I110 Hypertensive heart disease with heart failure: Secondary | ICD-10-CM | POA: Diagnosis not present

## 2022-08-14 DIAGNOSIS — C801 Malignant (primary) neoplasm, unspecified: Secondary | ICD-10-CM | POA: Diagnosis not present

## 2022-08-15 DIAGNOSIS — R180 Malignant ascites: Secondary | ICD-10-CM | POA: Diagnosis not present

## 2022-08-15 DIAGNOSIS — I509 Heart failure, unspecified: Secondary | ICD-10-CM | POA: Diagnosis not present

## 2022-08-15 DIAGNOSIS — I110 Hypertensive heart disease with heart failure: Secondary | ICD-10-CM | POA: Diagnosis not present

## 2022-08-15 DIAGNOSIS — R0600 Dyspnea, unspecified: Secondary | ICD-10-CM | POA: Diagnosis not present

## 2022-08-15 DIAGNOSIS — C801 Malignant (primary) neoplasm, unspecified: Secondary | ICD-10-CM | POA: Diagnosis not present

## 2022-08-15 DIAGNOSIS — C786 Secondary malignant neoplasm of retroperitoneum and peritoneum: Secondary | ICD-10-CM | POA: Diagnosis not present

## 2022-08-16 ENCOUNTER — Encounter: Payer: Medicare Other | Admitting: Nutrition

## 2022-08-16 DIAGNOSIS — C801 Malignant (primary) neoplasm, unspecified: Secondary | ICD-10-CM | POA: Diagnosis not present

## 2022-08-16 DIAGNOSIS — R0600 Dyspnea, unspecified: Secondary | ICD-10-CM | POA: Diagnosis not present

## 2022-08-16 DIAGNOSIS — I110 Hypertensive heart disease with heart failure: Secondary | ICD-10-CM | POA: Diagnosis not present

## 2022-08-16 DIAGNOSIS — C786 Secondary malignant neoplasm of retroperitoneum and peritoneum: Secondary | ICD-10-CM | POA: Diagnosis not present

## 2022-08-16 DIAGNOSIS — R180 Malignant ascites: Secondary | ICD-10-CM | POA: Diagnosis not present

## 2022-08-16 DIAGNOSIS — I509 Heart failure, unspecified: Secondary | ICD-10-CM | POA: Diagnosis not present

## 2022-09-04 DEATH — deceased

## 2022-09-07 ENCOUNTER — Inpatient Hospital Stay: Payer: Medicare Other | Admitting: Oncology

## 2022-09-07 ENCOUNTER — Telehealth: Payer: Self-pay | Admitting: Cardiology

## 2022-09-07 NOTE — Telephone Encounter (Signed)
Daughter called to report her mother has passed away.

## 2022-09-07 NOTE — Telephone Encounter (Signed)
Left message for patient's daughter offering our condolences. Will notify medical records.
